# Patient Record
Sex: Male | Born: 1950
Health system: Southern US, Community
[De-identification: ages and names within clinical notes are randomized; demographics above are authoritative.]

## PROBLEM LIST (undated history)

## (undated) DIAGNOSIS — E039 Hypothyroidism, unspecified: Secondary | ICD-10-CM

## (undated) DIAGNOSIS — K219 Gastro-esophageal reflux disease without esophagitis: Secondary | ICD-10-CM

## (undated) DIAGNOSIS — I10 Essential (primary) hypertension: Secondary | ICD-10-CM

## (undated) DIAGNOSIS — K37 Unspecified appendicitis: Secondary | ICD-10-CM

## (undated) DIAGNOSIS — E785 Hyperlipidemia, unspecified: Secondary | ICD-10-CM

## (undated) DIAGNOSIS — I4891 Unspecified atrial fibrillation: Secondary | ICD-10-CM

## (undated) DIAGNOSIS — F419 Anxiety disorder, unspecified: Secondary | ICD-10-CM

## (undated) DIAGNOSIS — I509 Heart failure, unspecified: Secondary | ICD-10-CM

## (undated) HISTORY — DX: Gastro-esophageal reflux disease without esophagitis: K21.9

## (undated) HISTORY — DX: Essential (primary) hypertension: I10

## (undated) HISTORY — DX: Hypothyroidism, unspecified: E03.9

## (undated) HISTORY — DX: Hyperlipidemia, unspecified: E78.5

## (undated) HISTORY — DX: Anxiety disorder, unspecified: F41.9

## (undated) HISTORY — DX: Unspecified appendicitis: K37

---

## 1969-06-27 HISTORY — PX: APPENDECTOMY: SHX54

## 1970-06-27 HISTORY — PX: WISDOM TOOTH EXTRACTION: SHX21

## 2014-02-28 LAB — HM COLONOSCOPY

## 2014-03-25 ENCOUNTER — Encounter: Payer: Self-pay | Admitting: Physician Assistant

## 2014-03-25 ENCOUNTER — Ambulatory Visit (INDEPENDENT_AMBULATORY_CARE_PROVIDER_SITE_OTHER): Payer: 59 | Admitting: Physician Assistant

## 2014-03-25 VITALS — BP 141/95 | HR 72 | Temp 98.2°F | Resp 16 | Ht 71.0 in | Wt 211.0 lb

## 2014-03-25 DIAGNOSIS — Z23 Encounter for immunization: Secondary | ICD-10-CM

## 2014-03-25 DIAGNOSIS — F341 Dysthymic disorder: Secondary | ICD-10-CM

## 2014-03-25 DIAGNOSIS — F419 Anxiety disorder, unspecified: Secondary | ICD-10-CM

## 2014-03-25 DIAGNOSIS — F329 Major depressive disorder, single episode, unspecified: Secondary | ICD-10-CM | POA: Insufficient documentation

## 2014-03-25 DIAGNOSIS — F32A Depression, unspecified: Secondary | ICD-10-CM

## 2014-03-25 DIAGNOSIS — I10 Essential (primary) hypertension: Secondary | ICD-10-CM

## 2014-03-25 DIAGNOSIS — E785 Hyperlipidemia, unspecified: Secondary | ICD-10-CM | POA: Insufficient documentation

## 2014-03-25 DIAGNOSIS — E039 Hypothyroidism, unspecified: Secondary | ICD-10-CM

## 2014-03-25 DIAGNOSIS — K219 Gastro-esophageal reflux disease without esophagitis: Secondary | ICD-10-CM

## 2014-03-25 HISTORY — DX: Anxiety disorder, unspecified: F41.9

## 2014-03-25 HISTORY — DX: Depression, unspecified: F32.A

## 2014-03-25 HISTORY — DX: Essential (primary) hypertension: I10

## 2014-03-25 NOTE — Patient Instructions (Signed)
Please continue medications as directed with the following exception -- I would hold off on the Testosterone gel for now until we recheck your testosterone level.   Please schedule a physical with me within the next month.  Come fasting to that appointment.  It was a pleasure participating in your care today.  Welcome to [facility]!  Preventive Care for Adults A healthy lifestyle and preventive care can promote health and wellness. Preventive health guidelines for men include the following key practices:  A routine yearly physical is a good way to check with your health care provider about your health and preventative screening. It is a chance to share any concerns and updates on your health and to receive a thorough exam.  Visit your dentist for a routine exam and preventative care every 6 months. Brush your teeth twice a day and floss once a day. Good oral hygiene prevents tooth decay and gum disease.  The frequency of eye exams is based on your age, health, family medical history, use of contact lenses, and other factors. Follow your health care provider's recommendations for frequency of eye exams.  Eat a healthy diet. Foods such as vegetables, fruits, whole grains, low-fat dairy products, and lean protein foods contain the nutrients you need without too many calories. Decrease your intake of foods high in solid fats, added sugars, and salt. Eat the right amount of calories for you.Get information about a proper diet from your health care provider, if necessary.  Regular physical exercise is one of the most important things you can do for your health. Most adults should get at least 150 minutes of moderate-intensity exercise (any activity that increases your heart rate and causes you to sweat) each week. In addition, most adults need muscle-strengthening exercises on 2 or more days a week.  Maintain a healthy weight. The body mass index (BMI) is a screening tool to identify possible weight problems.  It provides an estimate of body fat based on height and weight. Your health care provider can find your BMI and can help you achieve or maintain a healthy weight.For adults 20 years and older:  A BMI below 18.5 is considered underweight.  A BMI of 18.5 to 24.9 is normal.  A BMI of 25 to 29.9 is considered overweight.  A BMI of 30 and above is considered obese.  Maintain normal blood lipids and cholesterol levels by exercising and minimizing your intake of saturated fat. Eat a balanced diet with plenty of fruit and vegetables. Blood tests for lipids and cholesterol should begin at age 61 and be repeated every 5 years. If your lipid or cholesterol levels are high, you are over 50, or you are at high risk for heart disease, you may need your cholesterol levels checked more frequently.Ongoing high lipid and cholesterol levels should be treated with medicines if diet and exercise are not working.  If you smoke, find out from your health care provider how to quit. If you do not use tobacco, do not start.  Lung cancer screening is recommended for adults aged 57-80 years who are at high risk for developing lung cancer because of a history of smoking. A yearly low-dose CT scan of the lungs is recommended for people who have at least a 30-pack-year history of smoking and are a current smoker or have quit within the past 15 years. A pack year of smoking is smoking an average of 1 pack of cigarettes a day for 1 year (for example: 1 pack a day  for 30 years or 2 packs a day for 15 years). Yearly screening should continue until the smoker has stopped smoking for at least 15 years. Yearly screening should be stopped for people who develop a health problem that would prevent them from having lung cancer treatment.  If you choose to drink alcohol, do not have more than 2 drinks per day. One drink is considered to be 12 ounces (355 mL) of beer, 5 ounces (148 mL) of wine, or 1.5 ounces (44 mL) of liquor.  Avoid use  of street drugs. Do not share needles with anyone. Ask for help if you need support or instructions about stopping the use of drugs.  High blood pressure causes heart disease and increases the risk of stroke. Your blood pressure should be checked at least every 1-2 years. Ongoing high blood pressure should be treated with medicines, if weight loss and exercise are not effective.  If you are 83-51 years old, ask your health care provider if you should take aspirin to prevent heart disease.  Diabetes screening involves taking a blood sample to check your fasting blood sugar level. This should be done once every 3 years, after age 39, if you are within normal weight and without risk factors for diabetes. Testing should be considered at a younger age or be carried out more frequently if you are overweight and have at least 1 risk factor for diabetes.  Colorectal cancer can be detected and often prevented. Most routine colorectal cancer screening begins at the age of 45 and continues through age 3. However, your health care provider may recommend screening at an earlier age if you have risk factors for colon cancer. On a yearly basis, your health care provider may provide home test kits to check for hidden blood in the stool. Use of a small camera at the end of a tube to directly examine the colon (sigmoidoscopy or colonoscopy) can detect the earliest forms of colorectal cancer. Talk to your health care provider about this at age 74, when routine screening begins. Direct exam of the colon should be repeated every 5-10 years through age 9, unless early forms of precancerous polyps or small growths are found.  People who are at an increased risk for hepatitis B should be screened for this virus. You are considered at high risk for hepatitis B if:  You were born in a country where hepatitis B occurs often. Talk with your health care provider about which countries are considered high risk.  Your parents were  born in a high-risk country and you have not received a shot to protect against hepatitis B (hepatitis B vaccine).  You have HIV or AIDS.  You use needles to inject street drugs.  You live with, or have sex with, someone who has hepatitis B.  You are a man who has sex with other men (MSM).  You get hemodialysis treatment.  You take certain medicines for conditions such as cancer, organ transplantation, and autoimmune conditions.  Hepatitis C blood testing is recommended for all people born from [birth year] through 1965 and any individual with known risks for hepatitis C.  Practice safe sex. Use condoms and avoid high-risk sexual practices to reduce the spread of sexually transmitted infections (STIs). STIs include gonorrhea, chlamydia, syphilis, trichomonas, herpes, HPV, and human immunodeficiency virus (HIV). Herpes, HIV, and HPV are viral illnesses that have no cure. They can result in disability, cancer, and death.  If you are at risk of being infected with HIV, it  is recommended that you take a prescription medicine daily to prevent HIV infection. This is called preexposure prophylaxis (PrEP). You are considered at risk if:  You are a man who has sex with other men (MSM) and have other risk factors.  You are a heterosexual man, are sexually active, and are at increased risk for HIV infection.  You take drugs by injection.  You are sexually active with a partner who has HIV.  Talk with your health care provider about whether you are at high risk of being infected with HIV. If you choose to begin PrEP, you should first be tested for HIV. You should then be tested every 3 months for as long as you are taking PrEP.  A one-time screening for abdominal aortic aneurysm (AAA) and surgical repair of large AAAs by ultrasound are recommended for men ages 69 to 54 years who are current or former smokers.  Healthy men should no longer receive prostate-specific antigen (PSA) blood tests as part of  routine cancer screening. Talk with your health care provider about prostate cancer screening.  Testicular cancer screening is not recommended for adult males who have no symptoms. Screening includes self-exam, a health care provider exam, and other screening tests. Consult with your health care provider about any symptoms you have or any concerns you have about testicular cancer.  Use sunscreen. Apply sunscreen liberally and repeatedly throughout the day. You should seek shade when your shadow is shorter than you. Protect yourself by wearing long sleeves, pants, a wide-brimmed hat, and sunglasses year round, whenever you are outdoors.  Once a month, do a whole-body skin exam, using a mirror to look at the skin on your back. Tell your health care provider about new moles, moles that have irregular borders, moles that are larger than a pencil eraser, or moles that have changed in shape or color.  Stay current with required vaccines (immunizations).  Influenza vaccine. All adults should be immunized every year.  Tetanus, diphtheria, and acellular pertussis (Td, Tdap) vaccine. An adult who has not previously received Tdap or who does not know his vaccine status should receive 1 dose of Tdap. This initial dose should be followed by tetanus and diphtheria toxoids (Td) booster doses every 10 years. Adults with an unknown or incomplete history of completing a 3-dose immunization series with Td-containing vaccines should begin or complete a primary immunization series including a Tdap dose. Adults should receive a Td booster every 10 years.  Varicella vaccine. An adult without evidence of immunity to varicella should receive 2 doses or a second dose if he has previously received 1 dose.  Human papillomavirus (HPV) vaccine. Males aged 49-21 years who have not received the vaccine previously should receive the 3-dose series. Males aged 22-26 years may be immunized. Immunization is recommended through the age  of 31 years for any male who has sex with males and did not get any or all doses earlier. Immunization is recommended for any person with an immunocompromised condition through the age of 61 years if he did not get any or all doses earlier. During the 3-dose series, the second dose should be obtained 4-8 weeks after the first dose. The third dose should be obtained 24 weeks after the first dose and 16 weeks after the second dose.  Zoster vaccine. One dose is recommended for adults aged 63 years or older unless certain conditions are present.  Measles, mumps, and rubella (MMR) vaccine. Adults born before [birth year] generally are considered immune to measles  and mumps. Adults born in [birth year] or later should have 1 or more doses of MMR vaccine unless there is a contraindication to the vaccine or there is laboratory evidence of immunity to each of the three diseases. A routine second dose of MMR vaccine should be obtained at least 28 days after the first dose for students attending postsecondary schools, health care workers, or international travelers. People who received inactivated measles vaccine or an unknown type of measles vaccine during 1963-1967 should receive 2 doses of MMR vaccine. People who received inactivated mumps vaccine or an unknown type of mumps vaccine before 1979 and are at high risk for mumps infection should consider immunization with 2 doses of MMR vaccine. Unvaccinated health care workers born before [birth year] who lack laboratory evidence of measles, mumps, or rubella immunity or laboratory confirmation of disease should consider measles and mumps immunization with 2 doses of MMR vaccine or rubella immunization with 1 dose of MMR vaccine.  Pneumococcal 13-valent conjugate (PCV13) vaccine. When indicated, a person who is uncertain of his immunization history and has no record of immunization should receive the PCV13 vaccine. An adult aged 55 years or older who has certain medical conditions and has not  been previously immunized should receive 1 dose of PCV13 vaccine. This PCV13 should be followed with a dose of pneumococcal polysaccharide (PPSV23) vaccine. The PPSV23 vaccine dose should be obtained at least 8 weeks after the dose of PCV13 vaccine. An adult aged 13 years or older who has certain medical conditions and previously received 1 or more doses of PPSV23 vaccine should receive 1 dose of PCV13. The PCV13 vaccine dose should be obtained 1 or more years after the last PPSV23 vaccine dose.  Pneumococcal polysaccharide (PPSV23) vaccine. When PCV13 is also indicated, PCV13 should be obtained first. All adults aged 32 years and older should be immunized. An adult younger than age 29 years who has certain medical conditions should be immunized. Any person who resides in a nursing home or long-term care facility should be immunized. An adult smoker should be immunized. People with an immunocompromised condition and certain other conditions should receive both PCV13 and PPSV23 vaccines. People with human immunodeficiency virus (HIV) infection should be immunized as soon as possible after diagnosis. Immunization during chemotherapy or radiation therapy should be avoided. Routine use of PPSV23 vaccine is not recommended for American Indians, [place]atives, or people younger than 65 years unless there are medical conditions that require PPSV23 vaccine. When indicated, people who have unknown immunization and have no record of immunization should receive PPSV23 vaccine. One-time revaccination 5 years after the first dose of PPSV23 is recommended for people aged 19-64 years who have chronic kidney failure, nephrotic syndrome, asplenia, or immunocompromised conditions. People who received 1-2 doses of PPSV23 before age 28 years should receive another dose of PPSV23 vaccine at age 42 years or later if at least 5 years have passed since the previous dose. Doses of PPSV23 are not needed for people immunized with PPSV23 at  or after age 48 years.  Meningococcal vaccine. Adults with asplenia or persistent complement component deficiencies should receive 2 doses of quadrivalent meningococcal conjugate (MenACWY-D) vaccine. The doses should be obtained at least 2 months apart. Microbiologists working with certain meningococcal bacteria, [employer] recruits, people at risk during an outbreak, and people who travel to or live in countries with a high rate of meningitis should be immunized. A first-year college student up through age 46 years who is living in a residence hall  should receive a dose if he did not receive a dose on or after his 16th birthday. Adults who have certain high-risk conditions should receive one or more doses of vaccine.  Hepatitis A vaccine. Adults who wish to be protected from this disease, have certain high-risk conditions, work with hepatitis A-infected animals, work in hepatitis A research labs, or travel to or work in countries with a high rate of hepatitis A should be immunized. Adults who were previously unvaccinated and who anticipate close contact with an international adoptee during the first 60 days after arrival in the [place] from a country with a high rate of hepatitis A should be immunized.  Hepatitis B vaccine. Adults should be immunized if they wish to be protected from this disease, have certain high-risk conditions, may be exposed to blood or other infectious body fluids, are household contacts or sex partners of hepatitis B positive people, are clients or workers in certain care facilities, or travel to or work in countries with a high rate of hepatitis B.  Haemophilus influenzae type b (Hib) vaccine. A previously unvaccinated person with asplenia or sickle cell disease or having a scheduled splenectomy should receive 1 dose of Hib vaccine. Regardless of previous immunization, a recipient of a hematopoietic stem cell transplant should receive a 3-dose series 6-12 months after his  successful transplant. Hib vaccine is not recommended for adults with HIV infection. Preventive Service / Frequency Ages 52 to 17  Blood pressure check.** / Every 1 to 2 years.  Lipid and cholesterol check.** / Every 5 years beginning at age 57.  Hepatitis C blood test.** / For any individual with known risks for hepatitis C.  Skin self-exam. / Monthly.  Influenza vaccine. / Every year.  Tetanus, diphtheria, and acellular pertussis (Tdap, Td) vaccine.** / Consult your health care provider. 1 dose of Td every 10 years.  Varicella vaccine.** / Consult your health care provider.  HPV vaccine. / 3 doses over 6 months, if 45 or younger.  Measles, mumps, rubella (MMR) vaccine.** / You need at least 1 dose of MMR if you were born in 1957 or later. You may also need a second dose.  Pneumococcal 13-valent conjugate (PCV13) vaccine.** / Consult your health care provider.  Pneumococcal polysaccharide (PPSV23) vaccine.** / 1 to 2 doses if you smoke cigarettes or if you have certain conditions.  Meningococcal vaccine.** / 1 dose if you are age 11 to 66 years and a Market researcher living in a residence hall, or have one of several medical conditions. You may also need additional booster doses.  Hepatitis A vaccine.** / Consult your health care provider.  Hepatitis B vaccine.** / Consult your health care provider.  Haemophilus influenzae type b (Hib) vaccine.** / Consult your health care provider. Ages 34 to 15  Blood pressure check.** / Every 1 to 2 years.  Lipid and cholesterol check.** / Every 5 years beginning at age 65.  Lung cancer screening. / Every year if you are aged 44-80 years and have a 30-pack-year history of smoking and currently smoke or have quit within the past 15 years. Yearly screening is stopped once you have quit smoking for at least 15 years or develop a health problem that would prevent you from having lung cancer treatment.  Fecal occult blood test (FOBT)  of stool. / Every year beginning at age 8 and continuing until age 29. You may not have to do this test if you get a colonoscopy every 10 years.  Flexible  sigmoidoscopy** or colonoscopy.** / Every 5 years for a flexible sigmoidoscopy or every 10 years for a colonoscopy beginning at age 13 and continuing until age 41.  Hepatitis C blood test.** / For all people born from [birth year] through 1965 and any individual with known risks for hepatitis C.  Skin self-exam. / Monthly.  Influenza vaccine. / Every year.  Tetanus, diphtheria, and acellular pertussis (Tdap/Td) vaccine.** / Consult your health care provider. 1 dose of Td every 10 years.  Varicella vaccine.** / Consult your health care provider.  Zoster vaccine.** / 1 dose for adults aged 1 years or older.  Measles, mumps, rubella (MMR) vaccine.** / You need at least 1 dose of MMR if you were born in 1957 or later. You may also need a second dose.  Pneumococcal 13-valent conjugate (PCV13) vaccine.** / Consult your health care provider.  Pneumococcal polysaccharide (PPSV23) vaccine.** / 1 to 2 doses if you smoke cigarettes or if you have certain conditions.  Meningococcal vaccine.** / Consult your health care provider.  Hepatitis A vaccine.** / Consult your health care provider.  Hepatitis B vaccine.** / Consult your health care provider.  Haemophilus influenzae type b (Hib) vaccine.** / Consult your health care provider. Ages 75 and over  Blood pressure check.** / Every 1 to 2 years.  Lipid and cholesterol check.**/ Every 5 years beginning at age 35.  Lung cancer screening. / Every year if you are aged 43-80 years and have a 30-pack-year history of smoking and currently smoke or have quit within the past 15 years. Yearly screening is stopped once you have quit smoking for at least 15 years or develop a health problem that would prevent you from having lung cancer treatment.  Fecal occult blood test (FOBT) of stool. / Every year  beginning at age 43 and continuing until age 19. You may not have to do this test if you get a colonoscopy every 10 years.  Flexible sigmoidoscopy** or colonoscopy.** / Every 5 years for a flexible sigmoidoscopy or every 10 years for a colonoscopy beginning at age 59 and continuing until age 24.  Hepatitis C blood test.** / For all people born from [birth year] through 1967 and any individual with known risks for hepatitis C.  Abdominal aortic aneurysm (AAA) screening.** / A one-time screening for ages 51 to 17 years who are current or former smokers.  Skin self-exam. / Monthly.  Influenza vaccine. / Every year.  Tetanus, diphtheria, and acellular pertussis (Tdap/Td) vaccine.** / 1 dose of Td every 10 years.  Varicella vaccine.** / Consult your health care provider.  Zoster vaccine.** / 1 dose for adults aged 83 years or older.  Pneumococcal 13-valent conjugate (PCV13) vaccine.** / Consult your health care provider.  Pneumococcal polysaccharide (PPSV23) vaccine.** / 1 dose for all adults aged 43 years and older.  Meningococcal vaccine.** / Consult your health care provider.  Hepatitis A vaccine.** / Consult your health care provider.  Hepatitis B vaccine.** / Consult your health care provider.  Haemophilus influenzae type b (Hib) vaccine.** / Consult your health care provider. **Family history and personal history of risk and conditions may change your health care provider's recommendations. Document Released: 08/09/2001 Document Revised: 06/18/2013 Document Reviewed: 11/08/2010 [facility] Patient Information 2015 [place], [place]. This information is not intended to replace advice given to you by your health care provider. Make sure you discuss any questions you have with your health care provider.

## 2014-03-25 NOTE — Progress Notes (Signed)
Patient presents to clinic today to establish care.  Acute Concerns: Patient denies acute concerns at today's visit.  Chronic Issues: Hypertension -- Endorses well controlled with Hyzaar 50-12.5 mg daily.  Denies chest pain, palpitations. lightheadedness, vision changes or frequent headaches  Hyperlipidemia -- Endorses taking Lipitor 80 mg daily.  Denies myalgias.  Denies hx of elevated liver enzymes.  Hypothyroidism -- Currently on Levothyroxine 112 mcg daily.  Endorses well-controlled for several years on this dose.  GERD -- Good relief with Omeprazole 40 mg daily.   Anxiety -- Well-controlled with Paxil.  Denies panic attack, depressed mood or SI.  Health Maintenance: Colonoscopy -- 2015; no abnormal findings; due in 2025. Immunizations -- Tetanus 2012; Will be getting influenza vaccination at work; Zostavax in 2010.  Past Medical History  Diagnosis Date  . Hyperlipidemia   . Hypothyroidism   . Hypertension   . GERD (gastroesophageal reflux disease)   . Anxiety   . Appendicitis     Past Surgical History  Procedure Laterality Date  . Appendectomy  1971  . Wisdom tooth extraction  1972    No current outpatient prescriptions on file prior to visit.   No current facility-administered medications on file prior to visit.    Allergies  Allergen Reactions  . Codeine Nausea Only  . Niacin And Related     Family History  Problem Relation Age of Onset  . Stroke Mother 35    Deceased  . Heart attack Mother   . Lymphoma Father 72    Deceased  . Heart disease Father   . Stroke Maternal Grandmother   . Stomach cancer Paternal Grandfather   . Healthy Brother     x2  . Healthy Sister   . Hypertension Daughter   . Healthy Daughter     History   Social History  . Marital Status: Married    Spouse Name: N/A    Number of Children: N/A  . Years of Education: N/A   Occupational History  . Not on file.   Social History Main Topics  . Smoking status: Former  Smoker    Types: Cigarettes    Quit date: 06/28/1979  . Smokeless tobacco: Never Used  . Alcohol Use: Not on file  . Drug Use: Not on file  . Sexual Activity: Not on file   Other Topics Concern  . Not on file   Social History Narrative  . No narrative on file   ROS See HPI.  All other ROS are negative  BP 141/95  Pulse 72  Temp(Src) 98.2 F (36.8 C) (Oral)  Resp 16  Ht 5\' 11"  (1.803 m)  Wt 211 lb (95.709 kg)  BMI 29.44 kg/m2  SpO2 98%  Physical Exam  Vitals reviewed. Constitutional: He is oriented to person, place, and time and well-developed, well-nourished, and in no distress.  HENT:  Head: Normocephalic and atraumatic.  Right Ear: External ear normal.  Left Ear: External ear normal.  Nose: Nose normal.  Mouth/Throat: Oropharynx is clear and moist. No oropharyngeal exudate.  TM within normal limits bilaterally.  Eyes: Conjunctivae are normal. Pupils are equal, round, and reactive to light.  Neck: Neck supple.  Cardiovascular: Normal rate, regular rhythm, normal heart sounds and intact distal pulses.   Pulmonary/Chest: Effort normal and breath sounds normal. No respiratory distress. He has no wheezes. He has no rales. He exhibits no tenderness.  Neurological: He is alert and oriented to person, place, and time.  Skin: Skin is warm and dry. No rash  noted.  Psychiatric: Affect normal.    Assessment/Plan: Essential hypertension, benign Well-controlled.  Continue current regimen.  Patient to return this month for a CPE with fasting labs.  GERD (gastroesophageal reflux disease) Well controlled.  Continue current regimen.  Encouraged patient to consider trial of weaning to 20 mg daily.   Unspecified hypothyroidism Continue current regimen.  Will check TSH at CPE next month.  Anxiety and depression Well-controlled.  Continue Paxil as directed.  Hyperlipidemia Continue current regimen.  Will obtain LFTs and fasting lipid panel at CPE.  Need for prophylactic  vaccination against Streptococcus pneumoniae (pneumococcus) Prevnar given by nursing staff.

## 2014-03-25 NOTE — Assessment & Plan Note (Signed)
Well controlled.  Continue current regimen.  Encouraged patient to consider trial of weaning to 20 mg daily.

## 2014-03-25 NOTE — Progress Notes (Signed)
Pre visit review using our clinic review tool, if applicable. No additional management support is needed unless otherwise documented below in the visit note/SLS  

## 2014-03-25 NOTE — Assessment & Plan Note (Signed)
Well-controlled.  Continue Paxil as directed.

## 2014-03-25 NOTE — Assessment & Plan Note (Signed)
Continue current regimen.  Will obtain LFTs and fasting lipid panel at CPE.

## 2014-03-25 NOTE — Assessment & Plan Note (Signed)
Prevnar given by nursing staff. 

## 2014-03-25 NOTE — Assessment & Plan Note (Signed)
Well-controlled.  Continue current regimen.  Patient to return this month for a CPE with fasting labs.

## 2014-03-25 NOTE — Assessment & Plan Note (Signed)
Continue current regimen.  Will check TSH at CPE next month.

## 2014-04-09 ENCOUNTER — Telehealth: Payer: Self-pay | Admitting: *Deleted

## 2014-04-09 NOTE — Telephone Encounter (Signed)
Medical records received from [facility]. Records forwarded to Cody's CMA, Ivin Booty, for review/abstraction. JG//CMA

## 2014-04-30 ENCOUNTER — Ambulatory Visit (INDEPENDENT_AMBULATORY_CARE_PROVIDER_SITE_OTHER): Payer: 59 | Admitting: Physician Assistant

## 2014-04-30 ENCOUNTER — Encounter: Payer: Self-pay | Admitting: Physician Assistant

## 2014-04-30 VITALS — BP 149/87 | HR 65 | Temp 98.2°F | Ht 71.0 in | Wt 209.0 lb

## 2014-04-30 DIAGNOSIS — I10 Essential (primary) hypertension: Secondary | ICD-10-CM

## 2014-04-30 DIAGNOSIS — Z125 Encounter for screening for malignant neoplasm of prostate: Secondary | ICD-10-CM

## 2014-04-30 DIAGNOSIS — Z0000 Encounter for general adult medical examination without abnormal findings: Secondary | ICD-10-CM | POA: Insufficient documentation

## 2014-04-30 DIAGNOSIS — Z136 Encounter for screening for cardiovascular disorders: Secondary | ICD-10-CM

## 2014-04-30 HISTORY — DX: Encounter for screening for malignant neoplasm of prostate: Z12.5

## 2014-04-30 HISTORY — DX: Encounter for general adult medical examination without abnormal findings: Z00.00

## 2014-04-30 HISTORY — DX: Encounter for screening for cardiovascular disorders: Z13.6

## 2014-04-30 LAB — CBC
HEMATOCRIT: 40.4 % (ref 39.0–52.0)
Hemoglobin: 13.8 g/dL (ref 13.0–17.0)
MCHC: 34.1 g/dL (ref 30.0–36.0)
MCV: 90.6 fl (ref 78.0–100.0)
Platelets: 139 10*3/uL — ABNORMAL LOW (ref 150.0–400.0)
RBC: 4.46 Mil/uL (ref 4.22–5.81)
RDW: 13 % (ref 11.5–15.5)
WBC: 5.8 10*3/uL (ref 4.0–10.5)

## 2014-04-30 LAB — URINALYSIS, ROUTINE W REFLEX MICROSCOPIC
Bilirubin Urine: NEGATIVE
Ketones, ur: NEGATIVE
Leukocytes, UA: NEGATIVE
Nitrite: NEGATIVE
RBC / HPF: NONE SEEN (ref 0–?)
Specific Gravity, Urine: 1.01 (ref 1.000–1.030)
Total Protein, Urine: NEGATIVE
UROBILINOGEN UA: 0.2 (ref 0.0–1.0)
Urine Glucose: NEGATIVE
WBC UA: NONE SEEN (ref 0–?)
pH: 7 (ref 5.0–8.0)

## 2014-04-30 LAB — HEPATIC FUNCTION PANEL
ALBUMIN: 3.6 g/dL (ref 3.5–5.2)
ALT: 26 U/L (ref 0–53)
AST: 24 U/L (ref 0–37)
Alkaline Phosphatase: 67 U/L (ref 39–117)
Bilirubin, Direct: 0.2 mg/dL (ref 0.0–0.3)
Total Bilirubin: 1.3 mg/dL — ABNORMAL HIGH (ref 0.2–1.2)
Total Protein: 7.1 g/dL (ref 6.0–8.3)

## 2014-04-30 LAB — TSH: TSH: 2.39 u[IU]/mL (ref 0.35–4.50)

## 2014-04-30 LAB — PSA: PSA: 0.36 ng/mL (ref 0.10–4.00)

## 2014-04-30 LAB — HEMOGLOBIN A1C: Hgb A1c MFr Bld: 5.6 % (ref 4.6–6.5)

## 2014-04-30 NOTE — Patient Instructions (Signed)
Please stop by the lab for blood work.  I will call you with your results. I do believe your elevated BP is related to [facility] Coat Syndrome as your BP home measurements are within normal limits with current regimen.  Read information below on the DASH diet.    Follow-up will be based on your lab results.  Otherwise follow-up in 6 months.  Preventive Care for Adults A healthy lifestyle and preventive care can promote health and wellness. Preventive health guidelines for men include the following key practices:  A routine yearly physical is a good way to check with your health care provider about your health and preventative screening. It is a chance to share any concerns and updates on your health and to receive a thorough exam.  Visit your dentist for a routine exam and preventative care every 6 months. Brush your teeth twice a day and floss once a day. Good oral hygiene prevents tooth decay and gum disease.  The frequency of eye exams is based on your age, health, family medical history, use of contact lenses, and other factors. Follow your health care provider's recommendations for frequency of eye exams.  Eat a healthy diet. Foods such as vegetables, fruits, whole grains, low-fat dairy products, and lean protein foods contain the nutrients you need without too many calories. Decrease your intake of foods high in solid fats, added sugars, and salt. Eat the right amount of calories for you.Get information about a proper diet from your health care provider, if necessary.  Regular physical exercise is one of the most important things you can do for your health. Most adults should get at least 150 minutes of moderate-intensity exercise (any activity that increases your heart rate and causes you to sweat) each week. In addition, most adults need muscle-strengthening exercises on 2 or more days a week.  Maintain a healthy weight. The body mass index (BMI) is a screening tool to identify possible weight  problems. It provides an estimate of body fat based on height and weight. Your health care provider can find your BMI and can help you achieve or maintain a healthy weight.For adults 20 years and older:  A BMI below 18.5 is considered underweight.  A BMI of 18.5 to 24.9 is normal.  A BMI of 25 to 29.9 is considered overweight.  A BMI of 30 and above is considered obese.  Maintain normal blood lipids and cholesterol levels by exercising and minimizing your intake of saturated fat. Eat a balanced diet with plenty of fruit and vegetables. Blood tests for lipids and cholesterol should begin at age 25 and be repeated every 5 years. If your lipid or cholesterol levels are high, you are over 50, or you are at high risk for heart disease, you may need your cholesterol levels checked more frequently.Ongoing high lipid and cholesterol levels should be treated with medicines if diet and exercise are not working.  If you smoke, find out from your health care provider how to quit. If you do not use tobacco, do not start.  Lung cancer screening is recommended for adults aged 89-80 years who are at high risk for developing lung cancer because of a history of smoking. A yearly low-dose CT scan of the lungs is recommended for people who have at least a 30-pack-year history of smoking and are a current smoker or have quit within the past 15 years. A pack year of smoking is smoking an average of 1 pack of cigarettes a day for 1  year (for example: 1 pack a day for 30 years or 2 packs a day for 15 years). Yearly screening should continue until the smoker has stopped smoking for at least 15 years. Yearly screening should be stopped for people who develop a health problem that would prevent them from having lung cancer treatment.  If you choose to drink alcohol, do not have more than 2 drinks per day. One drink is considered to be 12 ounces (355 mL) of beer, 5 ounces (148 mL) of wine, or 1.5 ounces (44 mL) of  liquor.  Avoid use of street drugs. Do not share needles with anyone. Ask for help if you need support or instructions about stopping the use of drugs.  High blood pressure causes heart disease and increases the risk of stroke. Your blood pressure should be checked at least every 1-2 years. Ongoing high blood pressure should be treated with medicines, if weight loss and exercise are not effective.  If you are 2-71 years old, ask your health care provider if you should take aspirin to prevent heart disease.  Diabetes screening involves taking a blood sample to check your fasting blood sugar level. This should be done once every 3 years, after age 36, if you are within normal weight and without risk factors for diabetes. Testing should be considered at a younger age or be carried out more frequently if you are overweight and have at least 1 risk factor for diabetes.  Colorectal cancer can be detected and often prevented. Most routine colorectal cancer screening begins at the age of 63 and continues through age 62. However, your health care provider may recommend screening at an earlier age if you have risk factors for colon cancer. On a yearly basis, your health care provider may provide home test kits to check for hidden blood in the stool. Use of a small camera at the end of a tube to directly examine the colon (sigmoidoscopy or colonoscopy) can detect the earliest forms of colorectal cancer. Talk to your health care provider about this at age 49, when routine screening begins. Direct exam of the colon should be repeated every 5-10 years through age 10, unless early forms of precancerous polyps or small growths are found.  People who are at an increased risk for hepatitis B should be screened for this virus. You are considered at high risk for hepatitis B if:  You were born in a country where hepatitis B occurs often. Talk with your health care provider about which countries are considered high  risk.  Your parents were born in a high-risk country and you have not received a shot to protect against hepatitis B (hepatitis B vaccine).  You have HIV or AIDS.  You use needles to inject street drugs.  You live with, or have sex with, someone who has hepatitis B.  You are a man who has sex with other men (MSM).  You get hemodialysis treatment.  You take certain medicines for conditions such as cancer, organ transplantation, and autoimmune conditions.  Hepatitis C blood testing is recommended for all people born from [birth year] through 1965 and any individual with known risks for hepatitis C.  Practice safe sex. Use condoms and avoid high-risk sexual practices to reduce the spread of sexually transmitted infections (STIs). STIs include gonorrhea, chlamydia, syphilis, trichomonas, herpes, HPV, and human immunodeficiency virus (HIV). Herpes, HIV, and HPV are viral illnesses that have no cure. They can result in disability, cancer, and death.  If you are at  risk of being infected with HIV, it is recommended that you take a prescription medicine daily to prevent HIV infection. This is called preexposure prophylaxis (PrEP). You are considered at risk if:  You are a man who has sex with other men (MSM) and have other risk factors.  You are a heterosexual man, are sexually active, and are at increased risk for HIV infection.  You take drugs by injection.  You are sexually active with a partner who has HIV.  Talk with your health care provider about whether you are at high risk of being infected with HIV. If you choose to begin PrEP, you should first be tested for HIV. You should then be tested every 3 months for as long as you are taking PrEP.  A one-time screening for abdominal aortic aneurysm (AAA) and surgical repair of large AAAs by ultrasound are recommended for men ages 50 to 10 years who are current or former smokers.  Healthy men should no longer receive prostate-specific antigen (PSA)  blood tests as part of routine cancer screening. Talk with your health care provider about prostate cancer screening.  Testicular cancer screening is not recommended for adult males who have no symptoms. Screening includes self-exam, a health care provider exam, and other screening tests. Consult with your health care provider about any symptoms you have or any concerns you have about testicular cancer.  Use sunscreen. Apply sunscreen liberally and repeatedly throughout the day. You should seek shade when your shadow is shorter than you. Protect yourself by wearing long sleeves, pants, a wide-brimmed hat, and sunglasses year round, whenever you are outdoors.  Once a month, do a whole-body skin exam, using a mirror to look at the skin on your back. Tell your health care provider about new moles, moles that have irregular borders, moles that are larger than a pencil eraser, or moles that have changed in shape or color.  Stay current with required vaccines (immunizations).  Influenza vaccine. All adults should be immunized every year.  Tetanus, diphtheria, and acellular pertussis (Td, Tdap) vaccine. An adult who has not previously received Tdap or who does not know his vaccine status should receive 1 dose of Tdap. This initial dose should be followed by tetanus and diphtheria toxoids (Td) booster doses every 10 years. Adults with an unknown or incomplete history of completing a 3-dose immunization series with Td-containing vaccines should begin or complete a primary immunization series including a Tdap dose. Adults should receive a Td booster every 10 years.  Varicella vaccine. An adult without evidence of immunity to varicella should receive 2 doses or a second dose if he has previously received 1 dose.  Human papillomavirus (HPV) vaccine. Males aged 13-21 years who have not received the vaccine previously should receive the 3-dose series. Males aged 22-26 years may be immunized. Immunization is  recommended through the age of 64 years for any male who has sex with males and did not get any or all doses earlier. Immunization is recommended for any person with an immunocompromised condition through the age of 26 years if he did not get any or all doses earlier. During the 3-dose series, the second dose should be obtained 4-8 weeks after the first dose. The third dose should be obtained 24 weeks after the first dose and 16 weeks after the second dose.  Zoster vaccine. One dose is recommended for adults aged 70 years or older unless certain conditions are present.  Measles, mumps, and rubella (MMR) vaccine. Adults born before  1959 generally are considered immune to measles and mumps. Adults born in [birth year] or later should have 1 or more doses of MMR vaccine unless there is a contraindication to the vaccine or there is laboratory evidence of immunity to each of the three diseases. A routine second dose of MMR vaccine should be obtained at least 28 days after the first dose for students attending postsecondary schools, health care workers, or international travelers. People who received inactivated measles vaccine or an unknown type of measles vaccine during 1963-1967 should receive 2 doses of MMR vaccine. People who received inactivated mumps vaccine or an unknown type of mumps vaccine before 1979 and are at high risk for mumps infection should consider immunization with 2 doses of MMR vaccine. Unvaccinated health care workers born before [birth year] who lack laboratory evidence of measles, mumps, or rubella immunity or laboratory confirmation of disease should consider measles and mumps immunization with 2 doses of MMR vaccine or rubella immunization with 1 dose of MMR vaccine.  Pneumococcal 13-valent conjugate (PCV13) vaccine. When indicated, a person who is uncertain of his immunization history and has no record of immunization should receive the PCV13 vaccine. An adult aged 38 years or older who has certain  medical conditions and has not been previously immunized should receive 1 dose of PCV13 vaccine. This PCV13 should be followed with a dose of pneumococcal polysaccharide (PPSV23) vaccine. The PPSV23 vaccine dose should be obtained at least 8 weeks after the dose of PCV13 vaccine. An adult aged 31 years or older who has certain medical conditions and previously received 1 or more doses of PPSV23 vaccine should receive 1 dose of PCV13. The PCV13 vaccine dose should be obtained 1 or more years after the last PPSV23 vaccine dose.  Pneumococcal polysaccharide (PPSV23) vaccine. When PCV13 is also indicated, PCV13 should be obtained first. All adults aged 45 years and older should be immunized. An adult younger than age 68 years who has certain medical conditions should be immunized. Any person who resides in a nursing home or long-term care facility should be immunized. An adult smoker should be immunized. People with an immunocompromised condition and certain other conditions should receive both PCV13 and PPSV23 vaccines. People with human immunodeficiency virus (HIV) infection should be immunized as soon as possible after diagnosis. Immunization during chemotherapy or radiation therapy should be avoided. Routine use of PPSV23 vaccine is not recommended for American Indians, [place] Natives, or people younger than 65 years unless there are medical conditions that require PPSV23 vaccine. When indicated, people who have unknown immunization and have no record of immunization should receive PPSV23 vaccine. One-time revaccination 5 years after the first dose of PPSV23 is recommended for people aged 19-64 years who have chronic kidney failure, nephrotic syndrome, asplenia, or immunocompromised conditions. People who received 1-2 doses of PPSV23 before age 2 years should receive another dose of PPSV23 vaccine at age 60 years or later if at least 5 years have passed since the previous dose. Doses of PPSV23 are not needed for  people immunized with PPSV23 at or after age 23 years.  Meningococcal vaccine. Adults with asplenia or persistent complement component deficiencies should receive 2 doses of quadrivalent meningococcal conjugate (MenACWY-D) vaccine. The doses should be obtained at least 2 months apart. Microbiologists working with certain meningococcal bacteria, [employer] recruits, people at risk during an outbreak, and people who travel to or live in countries with a high rate of meningitis should be immunized. A first-year college student up through age 43 years  who is living in a residence hall should receive a dose if he did not receive a dose on or after his 16th birthday. Adults who have certain high-risk conditions should receive one or more doses of vaccine.  Hepatitis A vaccine. Adults who wish to be protected from this disease, have certain high-risk conditions, work with hepatitis A-infected animals, work in hepatitis A research labs, or travel to or work in countries with a high rate of hepatitis A should be immunized. Adults who were previously unvaccinated and who anticipate close contact with an international adoptee during the first 60 days after arrival in the [place] from a country with a high rate of hepatitis A should be immunized.  Hepatitis B vaccine. Adults should be immunized if they wish to be protected from this disease, have certain high-risk conditions, may be exposed to blood or other infectious body fluids, are household contacts or sex partners of hepatitis B positive people, are clients or workers in certain care facilities, or travel to or work in countries with a high rate of hepatitis B.  Haemophilus influenzae type b (Hib) vaccine. A previously unvaccinated person with asplenia or sickle cell disease or having a scheduled splenectomy should receive 1 dose of Hib vaccine. Regardless of previous immunization, a recipient of a hematopoietic stem cell transplant should receive a 3-dose  series 6-12 months after his successful transplant. Hib vaccine is not recommended for adults with HIV infection. Preventive Service / Frequency Ages 92 to 92  Blood pressure check.** / Every 1 to 2 years.  Lipid and cholesterol check.** / Every 5 years beginning at age 44.  Hepatitis C blood test.** / For any individual with known risks for hepatitis C.  Skin self-exam. / Monthly.  Influenza vaccine. / Every year.  Tetanus, diphtheria, and acellular pertussis (Tdap, Td) vaccine.** / Consult your health care provider. 1 dose of Td every 10 years.  Varicella vaccine.** / Consult your health care provider.  HPV vaccine. / 3 doses over 6 months, if 22 or younger.  Measles, mumps, rubella (MMR) vaccine.** / You need at least 1 dose of MMR if you were born in 1957 or later. You may also need a second dose.  Pneumococcal 13-valent conjugate (PCV13) vaccine.** / Consult your health care provider.  Pneumococcal polysaccharide (PPSV23) vaccine.** / 1 to 2 doses if you smoke cigarettes or if you have certain conditions.  Meningococcal vaccine.** / 1 dose if you are age 73 to 20 years and a Market researcher living in a residence hall, or have one of several medical conditions. You may also need additional booster doses.  Hepatitis A vaccine.** / Consult your health care provider.  Hepatitis B vaccine.** / Consult your health care provider.  Haemophilus influenzae type b (Hib) vaccine.** / Consult your health care provider. Ages 30 to 3  Blood pressure check.** / Every 1 to 2 years.  Lipid and cholesterol check.** / Every 5 years beginning at age 67.  Lung cancer screening. / Every year if you are aged 45-80 years and have a 30-pack-year history of smoking and currently smoke or have quit within the past 15 years. Yearly screening is stopped once you have quit smoking for at least 15 years or develop a health problem that would prevent you from having lung cancer  treatment.  Fecal occult blood test (FOBT) of stool. / Every year beginning at age 28 and continuing until age 59. You may not have to do this test if you get  a colonoscopy every 10 years.  Flexible sigmoidoscopy** or colonoscopy.** / Every 5 years for a flexible sigmoidoscopy or every 10 years for a colonoscopy beginning at age 46 and continuing until age 72.  Hepatitis C blood test.** / For all people born from [birth year] through 1967 and any individual with known risks for hepatitis C.  Skin self-exam. / Monthly.  Influenza vaccine. / Every year.  Tetanus, diphtheria, and acellular pertussis (Tdap/Td) vaccine.** / Consult your health care provider. 1 dose of Td every 10 years.  Varicella vaccine.** / Consult your health care provider.  Zoster vaccine.** / 1 dose for adults aged 81 years or older.  Measles, mumps, rubella (MMR) vaccine.** / You need at least 1 dose of MMR if you were born in 1957 or later. You may also need a second dose.  Pneumococcal 13-valent conjugate (PCV13) vaccine.** / Consult your health care provider.  Pneumococcal polysaccharide (PPSV23) vaccine.** / 1 to 2 doses if you smoke cigarettes or if you have certain conditions.  Meningococcal vaccine.** / Consult your health care provider.  Hepatitis A vaccine.** / Consult your health care provider.  Hepatitis B vaccine.** / Consult your health care provider.  Haemophilus influenzae type b (Hib) vaccine.** / Consult your health care provider. Ages 25 and over  Blood pressure check.** / Every 1 to 2 years.  Lipid and cholesterol check.**/ Every 5 years beginning at age 55.  Lung cancer screening. / Every year if you are aged 3-80 years and have a 30-pack-year history of smoking and currently smoke or have quit within the past 15 years. Yearly screening is stopped once you have quit smoking for at least 15 years or develop a health problem that would prevent you from having lung cancer treatment.  Fecal occult  blood test (FOBT) of stool. / Every year beginning at age 12 and continuing until age 24. You may not have to do this test if you get a colonoscopy every 10 years.  Flexible sigmoidoscopy** or colonoscopy.** / Every 5 years for a flexible sigmoidoscopy or every 10 years for a colonoscopy beginning at age 5 and continuing until age 37.  Hepatitis C blood test.** / For all people born from [birth year] through 1967 and any individual with known risks for hepatitis C.  Abdominal aortic aneurysm (AAA) screening.** / A one-time screening for ages 37 to 40 years who are current or former smokers.  Skin self-exam. / Monthly.  Influenza vaccine. / Every year.  Tetanus, diphtheria, and acellular pertussis (Tdap/Td) vaccine.** / 1 dose of Td every 10 years.  Varicella vaccine.** / Consult your health care provider.  Zoster vaccine.** / 1 dose for adults aged 20 years or older.  Pneumococcal 13-valent conjugate (PCV13) vaccine.** / Consult your health care provider.  Pneumococcal polysaccharide (PPSV23) vaccine.** / 1 dose for all adults aged 50 years and older.  Meningococcal vaccine.** / Consult your health care provider.  Hepatitis A vaccine.** / Consult your health care provider.  Hepatitis B vaccine.** / Consult your health care provider.  Haemophilus influenzae type b (Hib) vaccine.** / Consult your health care provider. **Family history and personal history of risk and conditions may change your health care provider's recommendations. Document Released: 08/09/2001 Document Revised: 06/18/2013 Document Reviewed: 11/08/2010 [facility] Patient Information 2015 [place], [place]. This information is not intended to replace advice given to you by your health care provider. Make sure you discuss any questions you have with your health care provider.

## 2014-04-30 NOTE — Assessment & Plan Note (Signed)
Discussed risks/benefits of DRE and PSA testing.  Patient asymptomatic.  Declines DRE.  Would like PSA.  Will check PSA today.

## 2014-04-30 NOTE — Progress Notes (Signed)
Pre visit review using our clinic review tool, if applicable. No additional management support is needed unless otherwise documented below in the visit note. 

## 2014-04-30 NOTE — Progress Notes (Signed)
Patient presents to clinic today for annual exam.  Patient is fasting for labs.  Acute Concerns: No concerns at today's visit.  Chronic Issues: Hypertension -- BP elevated in clinic today at 149/87. Patient endorses taking medication as directed.  BP at home has been averaging 115-125/70-80.  Denies chest pain, palpitations, lightheadedness, dizziness, vision changes.  Repeat BP measurement with appropriate-sized cuff shows somewhat improved BP.  Health Maintenance: Dental -- up-to-date Vision -- up-to-date Immunizations --  Up-to-date Colonoscopy -- up-to-date.  Due for repeat in 2025.  Past Medical History  Diagnosis Date  . Hyperlipidemia   . Hypothyroidism   . Hypertension   . GERD (gastroesophageal reflux disease)   . Anxiety   . Appendicitis     Past Surgical History  Procedure Laterality Date  . Appendectomy  1971  . Wisdom tooth extraction  1972    Current Outpatient Prescriptions on File Prior to Visit  Medication Sig Dispense Refill  . atorvastatin (LIPITOR) 80 MG tablet Take 80 mg by mouth daily.    . Glucosamine-Chondroit-Vit C-Mn (GLUCOSAMINE 1500 COMPLEX PO) Take by mouth daily.    Marland Kitchen levothyroxine (SYNTHROID, LEVOTHROID) 112 MCG tablet Take 112 mcg by mouth daily before breakfast.    . losartan-hydrochlorothiazide (HYZAAR) 50-12.5 MG per tablet Take 1 tablet by mouth daily.    . Multiple Vitamins-Minerals (CENTRUM SILVER ULTRA MENS) TABS Take by mouth daily.    Marland Kitchen omeprazole (PRILOSEC) 40 MG capsule Take 40 mg by mouth daily.    Marland Kitchen PARoxetine (PAXIL) 20 MG tablet Take 20 mg by mouth daily.    . sildenafil (VIAGRA) 100 MG tablet Take 100 mg by mouth daily as needed.     . testosterone (ANDROGEL) 50 MG/5GM (1%) GEL Place 5 g onto the skin daily as needed. TESTIM     No current facility-administered medications on file prior to visit.    Allergies  Allergen Reactions  . Codeine Nausea Only  . Niacin And Related     Family History  Problem Relation  Age of Onset  . Stroke Mother 61    Deceased  . Heart attack Mother   . Lymphoma Father 25    Deceased  . Heart disease Father   . Stroke Maternal Grandmother   . Stomach cancer Paternal Grandfather   . Healthy Brother     x2  . Healthy Sister   . Hypertension Daughter   . Healthy Daughter     History   Social History  . Marital Status: Married    Spouse Name: N/A    Number of Children: N/A  . Years of Education: N/A   Occupational History  . Not on file.   Social History Main Topics  . Smoking status: Former Smoker    Types: Cigarettes    Quit date: 06/28/1979  . Smokeless tobacco: Never Used  . Alcohol Use: Not on file  . Drug Use: Not on file  . Sexual Activity: Not on file   Other Topics Concern  . Not on file   Social History Narrative   Review of Systems  Constitutional: Negative for fever and weight loss.  HENT: Negative for ear discharge, ear pain, hearing loss and tinnitus.   Eyes: Negative for blurred vision, double vision, photophobia and pain.  Respiratory: Negative for shortness of breath.   Cardiovascular: Negative for chest pain and palpitations.  Gastrointestinal: Negative for heartburn, nausea, vomiting, abdominal pain, diarrhea, constipation, blood in stool and melena.  Genitourinary: Negative for dysuria, urgency, frequency,  hematuria and flank pain.  Neurological: Negative for dizziness, loss of consciousness and headaches.  Psychiatric/Behavioral: Negative for depression, suicidal ideas, hallucinations and substance abuse. The patient is not nervous/anxious and does not have insomnia.    BP 149/87 mmHg  Pulse 65  Temp(Src) 98.2 F (36.8 C) (Oral)  Ht 5\' 11"  (1.803 m)  Wt 209 lb (94.802 kg)  BMI 29.16 kg/m2  SpO2 99%  Physical Exam  Constitutional: He is oriented to person, place, and time and well-developed, well-nourished, and in no distress.  HENT:  Head: Normocephalic and atraumatic.  Right Ear: Tympanic membrane, external ear  and ear canal normal.  Left Ear: Tympanic membrane, external ear and ear canal normal.  Nose: Nose normal.  Mouth/Throat: Uvula is midline, oropharynx is clear and moist and mucous membranes are normal.  Eyes: Conjunctivae and EOM are normal. Pupils are equal, round, and reactive to light.  Neck: Neck supple.  Cardiovascular: Normal rate, regular rhythm, normal heart sounds and intact distal pulses.   Pulmonary/Chest: Effort normal and breath sounds normal. No respiratory distress. He has no wheezes. He has no rales. He exhibits no tenderness.  Abdominal: Soft. Bowel sounds are normal. He exhibits no distension and no mass. There is no tenderness. There is no rebound and no guarding.  Genitourinary:  Defers DRE for prostate examination.  Lymphadenopathy:    He has no cervical adenopathy.  Neurological: He is alert and oriented to person, place, and time.  Skin: Skin is warm and dry. No rash noted.  Psychiatric: Affect normal.  Vitals reviewed.  Assessment/Plan: Screening for ischemic heart disease EKG obtained revealing NSR.  81 mg ASA discussed with patient.  Continue Lipitor.  Will obtain fasting lipid panel.  Prostate cancer screening encounter, options and risks discussed Discussed risks/benefits of DRE and PSA testing.  Patient asymptomatic.  Declines DRE.  Would like PSA.  Will check PSA today.  Visit for preventive health examination History reviewed and updated.  Patient up-to-date on health maintenance including immunizations and colonoscopy.  Patient received Prevnar at last visit.  Per CDC guidelines, will be due for Pneumovax next year.  Will obtain fasting labs at today's visit.  Essential hypertension, benign Suspect some White Coat Syndrome as home BP measurements given look good.  DASH handout given.  Will routinely monitor BP.

## 2014-04-30 NOTE — Assessment & Plan Note (Signed)
EKG obtained revealing NSR.  81 mg ASA discussed with patient.  Continue Lipitor.  Will obtain fasting lipid panel.

## 2014-04-30 NOTE — Assessment & Plan Note (Signed)
History reviewed and updated.  Patient up-to-date on health maintenance including immunizations and colonoscopy.  Patient received Prevnar at last visit.  Per CDC guidelines, will be due for Pneumovax next year.  Will obtain fasting labs at today's visit.

## 2014-04-30 NOTE — Assessment & Plan Note (Signed)
Suspect some White Coat Syndrome as home BP measurements given look good.  DASH handout given.  Will routinely monitor BP.

## 2014-06-04 ENCOUNTER — Ambulatory Visit (INDEPENDENT_AMBULATORY_CARE_PROVIDER_SITE_OTHER): Payer: 59 | Admitting: Family Medicine

## 2014-06-04 ENCOUNTER — Encounter: Payer: Self-pay | Admitting: Family Medicine

## 2014-06-04 VITALS — BP 168/96 | HR 68 | Temp 97.8°F | Resp 16 | Wt 228.0 lb

## 2014-06-04 DIAGNOSIS — I10 Essential (primary) hypertension: Secondary | ICD-10-CM

## 2014-06-04 MED ORDER — VALSARTAN-HYDROCHLOROTHIAZIDE 160-12.5 MG PO TABS: 1.0000 | ORAL_TABLET | Freq: Every day | ORAL | Status: DC

## 2014-06-04 NOTE — Patient Instructions (Signed)
Follow up in 2-3 weeks to recheck BP STOP the Losartan START the Valsartan daily Limit your salt intake Continue to get regular exercise- this is great for BP and as a stress outlet Call with any questions or concerns Happy Holidays!!!

## 2014-06-04 NOTE — Progress Notes (Signed)
Pre visit review using our clinic review tool, if applicable. No additional management support is needed unless otherwise documented below in the visit note. 

## 2014-06-04 NOTE — Progress Notes (Signed)
   Subjective:    Patient ID: Darius Mitchell, male    DOB: 08/25/1950, 63 y.o.   MRN: [id number]  HPI HTN- chronic problem.  Pt reports he has typically been 'dead on 120/70'.  Has been on Losartan HCTZ x10 yrs due to periodic elevations to 130.  + family hx CAD.  Admits to increased stress w/ home and work.  Pt reports he will have light headedness occuring around mid day x4-5 days.  Pt reports home BP was '170 over something'.  No CP, SOB, HAs, visual changes, edema.  Review of Systems For ROS see HPI     Objective:   Physical Exam  Constitutional: He is oriented to person, place, and time. He appears well-developed and well-nourished. No distress.  HENT:  Head: Normocephalic and atraumatic.  Eyes: Conjunctivae and EOM are normal. Pupils are equal, round, and reactive to light.  Neck: Normal range of motion. Neck supple. No thyromegaly present.  Cardiovascular: Normal rate, regular rhythm, normal heart sounds and intact distal pulses.   No murmur heard. Pulmonary/Chest: Effort normal and breath sounds normal. No respiratory distress.  Abdominal: Soft. Bowel sounds are normal. He exhibits no distension.  Musculoskeletal: He exhibits no edema.  Lymphadenopathy:    He has no cervical adenopathy.  Neurological: He is alert and oriented to person, place, and time. No cranial nerve deficit.  Skin: Skin is warm and dry.  Psychiatric: He has a normal mood and affect. His behavior is normal.  Vitals reviewed.         Assessment & Plan:

## 2014-06-05 ENCOUNTER — Encounter: Payer: Self-pay | Admitting: General Practice

## 2014-06-05 LAB — CBC WITH DIFFERENTIAL/PLATELET
BASOS ABS: 0 10*3/uL (ref 0.0–0.1)
Basophils Relative: 0.5 % (ref 0.0–3.0)
EOS ABS: 0.3 10*3/uL (ref 0.0–0.7)
Eosinophils Relative: 3.9 % (ref 0.0–5.0)
HCT: 42.3 % (ref 39.0–52.0)
HEMOGLOBIN: 14.2 g/dL (ref 13.0–17.0)
Lymphocytes Relative: 20 % (ref 12.0–46.0)
Lymphs Abs: 1.4 10*3/uL (ref 0.7–4.0)
MCHC: 33.4 g/dL (ref 30.0–36.0)
MCV: 91.7 fl (ref 78.0–100.0)
MONOS PCT: 6 % (ref 3.0–12.0)
Monocytes Absolute: 0.4 10*3/uL (ref 0.1–1.0)
NEUTROS ABS: 4.8 10*3/uL (ref 1.4–7.7)
Neutrophils Relative %: 69.6 % (ref 43.0–77.0)
Platelets: 170 10*3/uL (ref 150.0–400.0)
RBC: 4.61 Mil/uL (ref 4.22–5.81)
RDW: 13.5 % (ref 11.5–15.5)
WBC: 6.9 10*3/uL (ref 4.0–10.5)

## 2014-06-05 LAB — BASIC METABOLIC PANEL
BUN: 20 mg/dL (ref 6–23)
CO2: 28 meq/L (ref 19–32)
Calcium: 9.3 mg/dL (ref 8.4–10.5)
Chloride: 103 mEq/L (ref 96–112)
Creatinine, Ser: 1 mg/dL (ref 0.4–1.5)
GFR: 79.6 mL/min (ref >60.00)
GLUCOSE: 90 mg/dL (ref 70–99)
POTASSIUM: 3.6 meq/L (ref 3.5–5.1)
SODIUM: 139 meq/L (ref 135–145)

## 2014-06-08 NOTE — Assessment & Plan Note (Addendum)
Deteriorated.  Pt's BP has been elevated at home.  Pt reports increased stress recently.  Recommended switching ARB to more potent Valsartan and pt continue to work on healthy diet, regular exercise, low Na, and stress outlet.  Check labs to assess CBC and BMP in setting of increasing ARB.  Reviewed supportive care and red flags that should prompt return.  Pt expressed understanding and is in agreement w/ plan.

## 2014-06-13 ENCOUNTER — Encounter: Payer: Self-pay | Admitting: Physician Assistant

## 2014-06-13 ENCOUNTER — Ambulatory Visit (INDEPENDENT_AMBULATORY_CARE_PROVIDER_SITE_OTHER): Payer: 59 | Admitting: Physician Assistant

## 2014-06-13 VITALS — BP 160/98 | HR 66 | Temp 97.5°F | Resp 16 | Wt 215.1 lb

## 2014-06-13 DIAGNOSIS — I10 Essential (primary) hypertension: Secondary | ICD-10-CM

## 2014-06-13 MED ORDER — VALSARTAN-HYDROCHLOROTHIAZIDE 160-25 MG PO TABS: 1.0000 | ORAL_TABLET | Freq: Every day | ORAL | Status: DC

## 2014-06-13 NOTE — Progress Notes (Signed)
Pre visit review using our clinic review tool, if applicable. No additional management support is needed unless otherwise documented below in the visit note. 

## 2014-06-13 NOTE — Assessment & Plan Note (Signed)
Increase Diovan to 160-25 mg daily.  DASH diet discussed.  Follow-up in 2 weeks.

## 2014-06-13 NOTE — Progress Notes (Signed)
Patient presents to clinic today c/o continued elevated BP measurements as high as 180/114 with new medication Diovan-HCT 160-12.5 mg daily.  BP currently at 160/98.  Denies chest pain, palpitations, lightheadedness or dizziness.  Denies change to diet.  Past Medical History  Diagnosis Date  . Hyperlipidemia   . Hypothyroidism   . Hypertension   . GERD (gastroesophageal reflux disease)   . Anxiety   . Appendicitis     Current Outpatient Prescriptions on File Prior to Visit  Medication Sig Dispense Refill  . atorvastatin (LIPITOR) 80 MG tablet Take 80 mg by mouth daily.    . Glucosamine-Chondroit-Vit C-Mn (GLUCOSAMINE 1500 COMPLEX PO) Take by mouth daily.    Marland Kitchen levothyroxine (SYNTHROID, LEVOTHROID) 112 MCG tablet Take 112 mcg by mouth daily before breakfast.    . Multiple Vitamins-Minerals (CENTRUM SILVER ULTRA MENS) TABS Take by mouth daily.    Marland Kitchen omeprazole (PRILOSEC) 40 MG capsule Take 40 mg by mouth daily.    Marland Kitchen PARoxetine (PAXIL) 20 MG tablet Take 20 mg by mouth daily.    . sildenafil (VIAGRA) 100 MG tablet Take 100 mg by mouth daily as needed.     . testosterone (ANDROGEL) 50 MG/5GM (1%) GEL Place 5 g onto the skin daily as needed. TESTIM     No current facility-administered medications on file prior to visit.    Allergies  Allergen Reactions  . Codeine Nausea Only  . Niacin And Related     Family History  Problem Relation Age of Onset  . Stroke Mother 16    Deceased  . Heart attack Mother   . Lymphoma Father 60    Deceased  . Heart disease Father   . Stroke Maternal Grandmother   . Stomach cancer Paternal Grandfather   . Healthy Brother     x2  . Healthy Sister   . Hypertension Daughter   . Healthy Daughter     History   Social History  . Marital Status: Married    Spouse Name: N/A    Number of Children: N/A  . Years of Education: N/A   Social History Main Topics  . Smoking status: Former Smoker    Types: Cigarettes    Quit date: 06/28/1979  .  Smokeless tobacco: Never Used  . Alcohol Use: None  . Drug Use: None  . Sexual Activity: None   Other Topics Concern  . None   Social History Narrative   Review of Systems - See HPI.  All other ROS are negative.  BP 160/98 mmHg  Pulse 66  Temp(Src) 97.5 F (36.4 C) (Oral)  Resp 16  Wt 215 lb 2 oz (97.58 kg)  SpO2 98%  Physical Exam  Constitutional: He is oriented to person, place, and time and well-developed, well-nourished, and in no distress.  HENT:  Head: Normocephalic and atraumatic.  Eyes: Conjunctivae are normal.  Cardiovascular: Normal rate, regular rhythm, normal heart sounds and intact distal pulses.   Pulmonary/Chest: Effort normal and breath sounds normal. No respiratory distress. He has no wheezes. He has no rales. He exhibits no tenderness.  Neurological: He is alert and oriented to person, place, and time.  Skin: Skin is warm and dry. No rash noted.  Psychiatric: Affect normal.  Vitals reviewed.  Recent Results (from the past 2160 hour(s))  CBC     Status: Abnormal   Collection Time: 04/30/14  9:21 AM  Result Value Ref Range   WBC 5.8 4.0 - 10.5 K/uL   RBC 4.46 4.22 -  5.81 Mil/uL   Platelets 139.0 (L) 150.0 - 400.0 K/uL   Hemoglobin 13.8 13.0 - 17.0 g/dL   HCT 40.4 39.0 - 52.0 %   MCV 90.6 78.0 - 100.0 fl   MCHC 34.1 30.0 - 36.0 g/dL   RDW 13.0 11.5 - 15.5 %  Hemoglobin A1c     Status: None   Collection Time: 04/30/14  9:21 AM  Result Value Ref Range   Hgb A1c MFr Bld 5.6 4.6 - 6.5 %    Comment: Glycemic Control Guidelines for People with Diabetes:Non Diabetic:  <6%Goal of Therapy: <7%Additional Action Suggested:  >8%   Hepatic function panel     Status: Abnormal   Collection Time: 04/30/14  9:21 AM  Result Value Ref Range   Total Bilirubin 1.3 (H) 0.2 - 1.2 mg/dL   Bilirubin, Direct 0.2 0.0 - 0.3 mg/dL   Alkaline Phosphatase 67 39 - 117 U/L   AST 24 0 - 37 U/L   ALT 26 0 - 53 U/L   Total Protein 7.1 6.0 - 8.3 g/dL   Albumin 3.6 3.5 - 5.2 g/dL    TSH     Status: None   Collection Time: 04/30/14  9:21 AM  Result Value Ref Range   TSH 2.39 0.35 - 4.50 uIU/mL  Urinalysis, Routine w reflex microscopic     Status: Abnormal   Collection Time: 04/30/14  9:21 AM  Result Value Ref Range   Color, Urine YELLOW Yellow;Lt. Yellow   APPearance CLEAR Clear   Specific Gravity, Urine 1.010 1.000-1.030   pH 7.0 5.0 - 8.0   Total Protein, Urine NEGATIVE Negative   Urine Glucose NEGATIVE Negative   Ketones, ur NEGATIVE Negative   Bilirubin Urine NEGATIVE Negative   Hgb urine dipstick TRACE-LYSED (A) Negative   Urobilinogen, UA 0.2 0.0 - 1.0   Leukocytes, UA NEGATIVE Negative   Nitrite NEGATIVE Negative   WBC, UA none seen 0-2/hpf   RBC / HPF none seen 0-2/hpf  PSA     Status: None   Collection Time: 04/30/14  9:21 AM  Result Value Ref Range   PSA 0.36 0.10 - 4.00 ng/mL  Basic metabolic panel     Status: None   Collection Time: 06/04/14  4:03 PM  Result Value Ref Range   Sodium 139 135 - 145 mEq/L   Potassium 3.6 3.5 - 5.1 mEq/L   Chloride 103 96 - 112 mEq/L   CO2 28 19 - 32 mEq/L   Glucose, Bld 90 70 - 99 mg/dL   BUN 20 6 - 23 mg/dL   Creatinine, Ser 1.0 0.4 - 1.5 mg/dL   Calcium 9.3 8.4 - 10.5 mg/dL   GFR 79.60 >60.00 mL/min  CBC with Differential     Status: None   Collection Time: 06/04/14  4:03 PM  Result Value Ref Range   WBC 6.9 4.0 - 10.5 K/uL   RBC 4.61 4.22 - 5.81 Mil/uL   Hemoglobin 14.2 13.0 - 17.0 g/dL   HCT 42.3 39.0 - 52.0 %   MCV 91.7 78.0 - 100.0 fl   MCHC 33.4 30.0 - 36.0 g/dL   RDW 13.5 11.5 - 15.5 %   Platelets 170.0 150.0 - 400.0 K/uL   Neutrophils Relative % 69.6 43.0 - 77.0 %   Lymphocytes Relative 20.0 12.0 - 46.0 %   Monocytes Relative 6.0 3.0 - 12.0 %   Eosinophils Relative 3.9 0.0 - 5.0 %   Basophils Relative 0.5 0.0 - 3.0 %   Neutro Abs  4.8 1.4 - 7.7 K/uL   Lymphs Abs 1.4 0.7 - 4.0 K/uL   Monocytes Absolute 0.4 0.1 - 1.0 K/uL   Eosinophils Absolute 0.3 0.0 - 0.7 K/uL   Basophils Absolute 0.0  0.0 - 0.1 K/uL    Assessment/Plan: Essential hypertension, benign Increase Diovan to 160-25 mg daily.  DASH diet discussed.  Follow-up in 2 weeks.

## 2014-06-13 NOTE — Patient Instructions (Signed)
Please take the new medication dose as directed.  Watch your salt intake.  Stay active.  Continue BP checks every couple of days or if you are having symptoms.  Write down. Follow-up in 2 months if BP improving.  DASH Eating Plan DASH stands for "Dietary Approaches to Stop Hypertension." The DASH eating plan is a healthy eating plan that has been shown to reduce high blood pressure (hypertension). Additional health benefits may include reducing the risk of type 2 diabetes mellitus, heart disease, and stroke. The DASH eating plan may also help with weight loss. WHAT DO I NEED TO KNOW ABOUT THE DASH EATING PLAN? For the DASH eating plan, you will follow these general guidelines:  Choose foods with a percent daily value for sodium of less than 5% (as listed on the food label).  Use salt-free seasonings or herbs instead of table salt or sea salt.  Check with your health care provider or pharmacist before using salt substitutes.  Eat lower-sodium products, often labeled as "lower sodium" or "no salt added."  Eat fresh foods.  Eat more vegetables, fruits, and low-fat dairy products.  Choose whole grains. Look for the word "whole" as the first word in the ingredient list.  Choose fish and skinless chicken or [place] more often than red meat. Limit fish, poultry, and meat to 6 oz (170 g) each day.  Limit sweets, desserts, sugars, and sugary drinks.  Choose heart-healthy fats.  Limit cheese to 1 oz (28 g) per day.  Eat more home-cooked food and less restaurant, buffet, and fast food.  Limit fried foods.  Cook foods using methods other than frying.  Limit canned vegetables. If you do use them, rinse them well to decrease the sodium.  When eating at a restaurant, ask that your food be prepared with less salt, or no salt if possible. WHAT FOODS CAN I EAT? Seek help from a dietitian for individual calorie needs. Grains Whole grain or whole wheat bread. Brown rice. Whole grain or whole  wheat pasta. Quinoa, bulgur, and whole grain cereals. Low-sodium cereals. Corn or whole wheat flour tortillas. Whole grain cornbread. Whole grain crackers. Low-sodium crackers. Vegetables Fresh or frozen vegetables (raw, steamed, roasted, or grilled). Low-sodium or reduced-sodium tomato and vegetable juices. Low-sodium or reduced-sodium tomato sauce and paste. Low-sodium or reduced-sodium canned vegetables.  Fruits All fresh, canned (in natural juice), or frozen fruits. Meat and Other Protein Products Ground beef (85% or leaner), grass-fed beef, or beef trimmed of fat. Skinless chicken or [place]. Ground chicken or [place]. Pork trimmed of fat. All fish and seafood. Eggs. Dried beans, peas, or lentils. Unsalted nuts and seeds. Unsalted canned beans. Dairy Low-fat dairy products, such as skim or 1% milk, 2% or reduced-fat cheeses, low-fat ricotta or cottage cheese, or plain low-fat yogurt. Low-sodium or reduced-sodium cheeses. Fats and Oils Tub margarines without trans fats. Light or reduced-fat mayonnaise and salad dressings (reduced sodium). Avocado. Safflower, olive, or canola oils. Natural peanut or almond butter. Other Unsalted popcorn and pretzels. The items listed above may not be a complete list of recommended foods or beverages. Contact your dietitian for more options. WHAT FOODS ARE NOT RECOMMENDED? Grains White bread. White pasta. White rice. Refined cornbread. Bagels and croissants. Crackers that contain trans fat. Vegetables Creamed or fried vegetables. Vegetables in a cheese sauce. Regular canned vegetables. Regular canned tomato sauce and paste. Regular tomato and vegetable juices. Fruits Dried fruits. Canned fruit in light or heavy syrup. Fruit juice. Meat and Other Protein Products Fatty cuts  of meat. Ribs, chicken wings, bacon, sausage, bologna, salami, chitterlings, fatback, hot dogs, bratwurst, and packaged luncheon meats. Salted nuts and seeds. Canned beans with  salt. Dairy Whole or 2% milk, cream, half-and-half, and cream cheese. Whole-fat or sweetened yogurt. Full-fat cheeses or blue cheese. Nondairy creamers and whipped toppings. Processed cheese, cheese spreads, or cheese curds. Condiments Onion and garlic salt, seasoned salt, table salt, and sea salt. Canned and packaged gravies. Worcestershire sauce. Tartar sauce. Barbecue sauce. Teriyaki sauce. Soy sauce, including reduced sodium. Steak sauce. Fish sauce. Oyster sauce. Cocktail sauce. Horseradish. Ketchup and mustard. Meat flavorings and tenderizers. Bouillon cubes. Hot sauce. Tabasco sauce. Marinades. Taco seasonings. Relishes. Fats and Oils Butter, stick margarine, lard, shortening, ghee, and bacon fat. Coconut, palm kernel, or palm oils. Regular salad dressings. Other Pickles and olives. Salted popcorn and pretzels. The items listed above may not be a complete list of foods and beverages to avoid. Contact your dietitian for more information. WHERE CAN I FIND MORE INFORMATION? National Heart, [employer], and [employer] Institute: travelstabloid.com Document Released: 06/02/2011 Document Revised: 10/28/2013 Document Reviewed: 04/17/2013 [facility] Patient Information 2015 [place], [place]. This information is not intended to replace advice given to you by your health care provider. Make sure you discuss any questions you have with your health care provider.

## 2014-06-24 ENCOUNTER — Ambulatory Visit: Payer: 59 | Admitting: Physician Assistant

## 2014-07-11 ENCOUNTER — Telehealth: Payer: Self-pay | Admitting: Physician Assistant

## 2014-07-11 MED ORDER — PAROXETINE HCL 20 MG PO TABS: 20.0000 mg | ORAL_TABLET | Freq: Every day | ORAL | Status: DC

## 2014-07-11 NOTE — Telephone Encounter (Signed)
Patient's wife came into office inquiring about Rx request that was called in this morning [01.15.16] and wanted to know "what the hold up was and why medication was not already at the pharmacy"; pt's wife was informed of the 71-69 hour policy on refill request and not to wait until you run out of medication before you request refill any time in the future, as this would not be allowed again d/t being in clinic with patients throughout the day. Rx request to pharmacy/SLS

## 2014-07-11 NOTE — Telephone Encounter (Signed)
Caller name: germain Relation to pt: self Call back number: [phone] Pharmacy: [facility]  Reason for call:   No longer using [facility]. Needs refill of PARoxetine (PAXIL) 20 MG tablet. Patient is out.

## 2014-07-14 ENCOUNTER — Telehealth: Payer: Self-pay | Admitting: Physician Assistant

## 2014-07-14 MED ORDER — LEVOTHYROXINE SODIUM 112 MCG PO TABS: 112.0000 ug | ORAL_TABLET | Freq: Every day | ORAL | Status: DC

## 2014-07-14 NOTE — Telephone Encounter (Signed)
Caller name: [place], [place]iam Relation to pt: self  Call back number: [phone] Pharmacy: [facility]   Reason for call:  Pt states he will be in the area today picking up another RX requesting a refill levothyroxine (SYNTHROID, LEVOTHROID) 112 MCG tablet please send to  [place], [place] [phone] (Phone)

## 2014-07-14 NOTE — Telephone Encounter (Signed)
Rx request to pharmacy/SLS  

## 2014-08-15 ENCOUNTER — Encounter: Payer: Self-pay | Admitting: Physician Assistant

## 2014-08-15 ENCOUNTER — Ambulatory Visit (INDEPENDENT_AMBULATORY_CARE_PROVIDER_SITE_OTHER): Payer: 59 | Admitting: Physician Assistant

## 2014-08-15 VITALS — BP 112/69 | HR 69 | Temp 98.1°F | Resp 16 | Ht 71.0 in | Wt 215.5 lb

## 2014-08-15 DIAGNOSIS — I10 Essential (primary) hypertension: Secondary | ICD-10-CM

## 2014-08-15 MED ORDER — VALSARTAN-HYDROCHLOROTHIAZIDE 160-25 MG PO TABS: 1.0000 | ORAL_TABLET | Freq: Every day | ORAL | Status: DC

## 2014-08-15 NOTE — Progress Notes (Signed)
Patient presents to clinic today for 2 month follow-up of hypertension.  Patient currently on Diovan-HCT 160-25 mg tablet daily for BP. Patient denies chest pain, palpitations, lightheadedness, dizziness, vision changes or frequent headaches.  BP 112/69 in clinic. Is checking BP at home with measurements ranging 100-120/60-70.  Is trying to watch diet.  Past Medical History  Diagnosis Date  . Hyperlipidemia   . Hypothyroidism   . Hypertension   . GERD (gastroesophageal reflux disease)   . Anxiety   . Appendicitis     Current Outpatient Prescriptions on File Prior to Visit  Medication Sig Dispense Refill  . atorvastatin (LIPITOR) 80 MG tablet Take 80 mg by mouth daily.    . Glucosamine-Chondroit-Vit C-Mn (GLUCOSAMINE 1500 COMPLEX PO) Take by mouth daily.    Marland Kitchen levothyroxine (SYNTHROID, LEVOTHROID) 112 MCG tablet Take 1 tablet (112 mcg total) by mouth daily before breakfast. 30 tablet 2  . Multiple Vitamins-Minerals (CENTRUM SILVER ULTRA MENS) TABS Take by mouth daily.    Marland Kitchen omeprazole (PRILOSEC) 40 MG capsule Take 40 mg by mouth daily.    Marland Kitchen PARoxetine (PAXIL) 20 MG tablet Take 1 tablet (20 mg total) by mouth daily. 30 tablet 1  . sildenafil (VIAGRA) 100 MG tablet Take 100 mg by mouth daily as needed.     . testosterone (ANDROGEL) 50 MG/5GM (1%) GEL Place 5 g onto the skin daily as needed. TESTIM    . valsartan-hydrochlorothiazide (DIOVAN HCT) 160-25 MG per tablet Take 1 tablet by mouth daily. 30 tablet 3   No current facility-administered medications on file prior to visit.    Allergies  Allergen Reactions  . Codeine Nausea Only  . Niacin And Related     Family History  Problem Relation Age of Onset  . Stroke Mother 5    Deceased  . Heart attack Mother   . Lymphoma Father 50    Deceased  . Heart disease Father   . Stroke Maternal Grandmother   . Stomach cancer Paternal Grandfather   . Healthy Brother     x2  . Healthy Sister   . Hypertension Daughter   . Healthy  Daughter     History   Social History  . Marital Status: Married    Spouse Name: N/A  . Number of Children: N/A  . Years of Education: N/A   Social History Main Topics  . Smoking status: Former Smoker    Types: Cigarettes    Quit date: 06/28/1979  . Smokeless tobacco: Never Used  . Alcohol Use: Not on file  . Drug Use: Not on file  . Sexual Activity: Not on file   Other Topics Concern  . None   Social History Narrative   Review of Systems - See HPI.  All other ROS are negative.  BP 112/69 mmHg  Pulse 69  Temp(Src) 98.1 F (36.7 C) (Oral)  Resp 16  Ht 5\' 11"  (1.803 m)  Wt 215 lb 8 oz (97.75 kg)  BMI 30.07 kg/m2  SpO2 99%  Physical Exam  Constitutional: He is oriented to person, place, and time and well-developed, well-nourished, and in no distress.  HENT:  Head: Normocephalic and atraumatic.  Eyes: Conjunctivae are normal.  Neck: Neck supple.  Cardiovascular: Normal rate, regular rhythm, normal heart sounds and intact distal pulses.   Pulmonary/Chest: Effort normal and breath sounds normal. No respiratory distress. He has no wheezes. He has no rales. He exhibits no tenderness.  Neurological: He is alert and oriented to person, place, and  time.  Skin: Skin is warm and dry. No rash noted.  Psychiatric: Affect normal.    Recent Results (from the past 2160 hour(s))  Basic metabolic panel     Status: None   Collection Time: 06/04/14  4:03 PM  Result Value Ref Range   Sodium 139 135 - 145 mEq/L   Potassium 3.6 3.5 - 5.1 mEq/L   Chloride 103 96 - 112 mEq/L   CO2 28 19 - 32 mEq/L   Glucose, Bld 90 70 - 99 mg/dL   BUN 20 6 - 23 mg/dL   Creatinine, Ser 1.0 0.4 - 1.5 mg/dL   Calcium 9.3 8.4 - 10.5 mg/dL   GFR 79.60 >60.00 mL/min  CBC with Differential     Status: None   Collection Time: 06/04/14  4:03 PM  Result Value Ref Range   WBC 6.9 4.0 - 10.5 K/uL   RBC 4.61 4.22 - 5.81 Mil/uL   Hemoglobin 14.2 13.0 - 17.0 g/dL   HCT 42.3 39.0 - 52.0 %   MCV 91.7 78.0 -  100.0 fl   MCHC 33.4 30.0 - 36.0 g/dL   RDW 13.5 11.5 - 15.5 %   Platelets 170.0 150.0 - 400.0 K/uL   Neutrophils Relative % 69.6 43.0 - 77.0 %   Lymphocytes Relative 20.0 12.0 - 46.0 %   Monocytes Relative 6.0 3.0 - 12.0 %   Eosinophils Relative 3.9 0.0 - 5.0 %   Basophils Relative 0.5 0.0 - 3.0 %   Neutro Abs 4.8 1.4 - 7.7 K/uL   Lymphs Abs 1.4 0.7 - 4.0 K/uL   Monocytes Absolute 0.4 0.1 - 1.0 K/uL   Eosinophils Absolute 0.3 0.0 - 0.7 K/uL   Basophils Absolute 0.0 0.0 - 0.1 K/uL    Assessment/Plan: Essential hypertension, benign Doing well.  Asymptomatic. BP normotensive today in clinic. Medications refilled.  Follow-up in 6 months.

## 2014-08-15 NOTE — Assessment & Plan Note (Signed)
Doing well.  Asymptomatic. BP normotensive today in clinic. Medications refilled.  Follow-up in 6 months.

## 2014-08-15 NOTE — Progress Notes (Signed)
Pre visit review using our clinic review tool, if applicable. No additional management support is needed unless otherwise documented below in the visit note/SLS  

## 2014-08-15 NOTE — Patient Instructions (Signed)
Please continue medications as directed.  Continue watching diet and staying active.  Follow-up in 6 months.  Hypertension Hypertension, commonly called high blood pressure, is when the force of blood pumping through your arteries is too strong. Your arteries are the blood vessels that carry blood from your heart throughout your body. A blood pressure reading consists of a higher number over a lower number, such as 110/72. The higher number (systolic) is the pressure inside your arteries when your heart pumps. The lower number (diastolic) is the pressure inside your arteries when your heart relaxes. Ideally you want your blood pressure below 120/80. Hypertension forces your heart to work harder to pump blood. Your arteries may become narrow or stiff. Having hypertension puts you at risk for heart disease, stroke, and other problems.  RISK FACTORS Some risk factors for high blood pressure are controllable. Others are not.  Risk factors you cannot control include:   Race. You may be at higher risk if you are African American.  Age. Risk increases with age.  Gender. Men are at higher risk than women before age 42 years. After age 2, women are at higher risk than men. Risk factors you can control include:  Not getting enough exercise or physical activity.  Being overweight.  Getting too much fat, sugar, calories, or salt in your diet.  Drinking too much alcohol. SIGNS AND SYMPTOMS Hypertension does not usually cause signs or symptoms. Extremely high blood pressure (hypertensive crisis) may cause headache, anxiety, shortness of breath, and nosebleed. DIAGNOSIS  To check if you have hypertension, your health care provider will measure your blood pressure while you are seated, with your arm held at the level of your heart. It should be measured at least twice using the same arm. Certain conditions can cause a difference in blood pressure between your right and left arms. A blood pressure  reading that is higher than normal on one occasion does not mean that you need treatment. If one blood pressure reading is high, ask your health care provider about having it checked again. TREATMENT  Treating high blood pressure includes making lifestyle changes and possibly taking medicine. Living a healthy lifestyle can help lower high blood pressure. You may need to change some of your habits. Lifestyle changes may include:  Following the DASH diet. This diet is high in fruits, vegetables, and whole grains. It is low in salt, red meat, and added sugars.  Getting at least 2 hours of brisk physical activity every week.  Losing weight if necessary.  Not smoking.  Limiting alcoholic beverages.  Learning ways to reduce stress. If lifestyle changes are not enough to get your blood pressure under control, your health care provider may prescribe medicine. You may need to take more than one. Work closely with your health care provider to understand the risks and benefits. HOME CARE INSTRUCTIONS  Have your blood pressure rechecked as directed by your health care provider.   Take medicines only as directed by your health care provider. Follow the directions carefully. Blood pressure medicines must be taken as prescribed. The medicine does not work as well when you skip doses. Skipping doses also puts you at risk for problems.   Do not smoke.   Monitor your blood pressure at home as directed by your health care provider. SEEK MEDICAL CARE IF:   You think you are having a reaction to medicines taken.  You have recurrent headaches or feel dizzy.  You have swelling in your ankles.  You  have trouble with your vision. SEEK IMMEDIATE MEDICAL CARE IF:  You develop a severe headache or confusion.  You have unusual weakness, numbness, or feel faint.  You have severe chest or abdominal pain.  You vomit repeatedly.  You have trouble breathing. MAKE SURE YOU:   Understand these  instructions.  Will watch your condition.  Will get help right away if you are not doing well or get worse. Document Released: 06/13/2005 Document Revised: 10/28/2013 Document Reviewed: 04/05/2013 [facility] Patient Information 2015 [place], [place]. This information is not intended to replace advice given to you by your health care provider. Make sure you discuss any questions you have with your health care provider.

## 2014-09-05 ENCOUNTER — Other Ambulatory Visit: Payer: Self-pay | Admitting: Physician Assistant

## 2014-09-05 NOTE — Telephone Encounter (Signed)
Medication Detail      Disp Refills Start End     PARoxetine (PAXIL) 20 MG tablet 30 tablet 1 07/11/2014     Sig - Route: Take 1 tablet (20 mg total) by mouth daily. - Oral    E-Prescribing Status: Receipt confirmed by pharmacy (07/11/2014 2:12 PM EST)     Pharmacy    [place] [place] [facility] [place]: 02.19.16 Please Advise on refills/SLS

## 2014-10-02 ENCOUNTER — Other Ambulatory Visit: Payer: Self-pay | Admitting: *Deleted

## 2014-10-02 MED ORDER — ATORVASTATIN CALCIUM 80 MG PO TABS: 80.0000 mg | ORAL_TABLET | Freq: Every day | ORAL | Status: DC

## 2014-10-02 NOTE — Telephone Encounter (Signed)
Rx sent to the pharmacy by e-script.//AB/CMA 

## 2014-10-20 ENCOUNTER — Other Ambulatory Visit: Payer: Self-pay | Admitting: Physician Assistant

## 2014-10-29 ENCOUNTER — Ambulatory Visit: Payer: 59 | Admitting: Physician Assistant

## 2015-01-05 ENCOUNTER — Other Ambulatory Visit: Payer: Self-pay | Admitting: Physician Assistant

## 2015-01-22 ENCOUNTER — Other Ambulatory Visit: Payer: Self-pay | Admitting: Physician Assistant

## 2015-03-23 ENCOUNTER — Telehealth: Payer: Self-pay | Admitting: *Deleted

## 2015-03-23 DIAGNOSIS — I10 Essential (primary) hypertension: Secondary | ICD-10-CM

## 2015-03-23 MED ORDER — VALSARTAN-HYDROCHLOROTHIAZIDE 160-25 MG PO TABS: 1.0000 | ORAL_TABLET | Freq: Every day | ORAL | Status: DC

## 2015-03-23 MED ORDER — PAROXETINE HCL 20 MG PO TABS: 20.0000 mg | ORAL_TABLET | Freq: Every day | ORAL | Status: DC

## 2015-03-23 MED ORDER — ATORVASTATIN CALCIUM 80 MG PO TABS: 80.0000 mg | ORAL_TABLET | Freq: Every day | ORAL | Status: DC

## 2015-03-23 MED ORDER — OMEPRAZOLE 40 MG PO CPDR: 40.0000 mg | DELAYED_RELEASE_CAPSULE | Freq: Every day | ORAL | Status: DC

## 2015-03-23 MED ORDER — LEVOTHYROXINE SODIUM 112 MCG PO TABS: ORAL_TABLET | ORAL | Status: DC

## 2015-03-23 NOTE — Telephone Encounter (Signed)
Rx request to mail order pharmacy/SLS  

## 2015-03-25 ENCOUNTER — Other Ambulatory Visit: Payer: Self-pay | Admitting: Physician Assistant

## 2015-05-14 ENCOUNTER — Encounter: Payer: Self-pay | Admitting: Behavioral Health

## 2015-05-14 ENCOUNTER — Telehealth: Payer: Self-pay | Admitting: Behavioral Health

## 2015-05-14 NOTE — Telephone Encounter (Signed)
Pre-Visit Call completed with patient and chart updated.   Pre-Visit Info documented in Specialty Comments under [employer].    

## 2015-05-15 ENCOUNTER — Encounter: Payer: Self-pay | Admitting: Physician Assistant

## 2015-05-15 ENCOUNTER — Ambulatory Visit (INDEPENDENT_AMBULATORY_CARE_PROVIDER_SITE_OTHER): Payer: 59 | Admitting: Physician Assistant

## 2015-05-15 VITALS — BP 128/84 | HR 85 | Temp 98.1°F | Resp 16 | Ht 71.0 in | Wt 217.5 lb

## 2015-05-15 DIAGNOSIS — I10 Essential (primary) hypertension: Secondary | ICD-10-CM | POA: Diagnosis not present

## 2015-05-15 DIAGNOSIS — E785 Hyperlipidemia, unspecified: Secondary | ICD-10-CM

## 2015-05-15 DIAGNOSIS — R7989 Other specified abnormal findings of blood chemistry: Secondary | ICD-10-CM

## 2015-05-15 DIAGNOSIS — F418 Other specified anxiety disorders: Secondary | ICD-10-CM | POA: Diagnosis not present

## 2015-05-15 DIAGNOSIS — Z23 Encounter for immunization: Secondary | ICD-10-CM

## 2015-05-15 DIAGNOSIS — E291 Testicular hypofunction: Secondary | ICD-10-CM | POA: Diagnosis not present

## 2015-05-15 DIAGNOSIS — Z1159 Encounter for screening for other viral diseases: Secondary | ICD-10-CM

## 2015-05-15 DIAGNOSIS — Z6830 Body mass index (BMI) 30.0-30.9, adult: Secondary | ICD-10-CM

## 2015-05-15 DIAGNOSIS — Z0000 Encounter for general adult medical examination without abnormal findings: Secondary | ICD-10-CM

## 2015-05-15 DIAGNOSIS — F329 Major depressive disorder, single episode, unspecified: Secondary | ICD-10-CM

## 2015-05-15 DIAGNOSIS — E039 Hypothyroidism, unspecified: Secondary | ICD-10-CM | POA: Diagnosis not present

## 2015-05-15 DIAGNOSIS — Z125 Encounter for screening for malignant neoplasm of prostate: Secondary | ICD-10-CM

## 2015-05-15 DIAGNOSIS — F32A Depression, unspecified: Secondary | ICD-10-CM

## 2015-05-15 DIAGNOSIS — F419 Anxiety disorder, unspecified: Secondary | ICD-10-CM

## 2015-05-15 HISTORY — DX: Other specified abnormal findings of blood chemistry: R79.89

## 2015-05-15 HISTORY — DX: Body mass index (BMI) 30.0-30.9, adult: Z68.30

## 2015-05-15 LAB — URINALYSIS, ROUTINE W REFLEX MICROSCOPIC
BILIRUBIN URINE: NEGATIVE
Ketones, ur: NEGATIVE
LEUKOCYTES UA: NEGATIVE
NITRITE: NEGATIVE
PH: 7 (ref 5.0–8.0)
Specific Gravity, Urine: 1.015 (ref 1.000–1.030)
Total Protein, Urine: NEGATIVE
Urine Glucose: NEGATIVE
Urobilinogen, UA: 0.2 (ref 0.0–1.0)
WBC, UA: NONE SEEN (ref 0–?)

## 2015-05-15 LAB — CBC
HEMATOCRIT: 42.4 % (ref 39.0–52.0)
Hemoglobin: 14.1 g/dL (ref 13.0–17.0)
MCHC: 33.3 g/dL (ref 30.0–36.0)
MCV: 92.4 fl (ref 78.0–100.0)
Platelets: 138 10*3/uL — ABNORMAL LOW (ref 150.0–400.0)
RBC: 4.58 Mil/uL (ref 4.22–5.81)
RDW: 13.2 % (ref 11.5–15.5)
WBC: 6.5 10*3/uL (ref 4.0–10.5)

## 2015-05-15 LAB — HEPATITIS C ANTIBODY: HCV AB: NEGATIVE

## 2015-05-15 LAB — LIPID PANEL
CHOL/HDL RATIO: 4
Cholesterol: 180 mg/dL (ref 0–200)
HDL: 40.2 mg/dL (ref >39.00)
LDL Cholesterol: 112 mg/dL — ABNORMAL HIGH (ref 0–99)
NonHDL: 139.93
TRIGLYCERIDES: 139 mg/dL (ref 0.0–149.0)
VLDL: 27.8 mg/dL (ref 0.0–40.0)

## 2015-05-15 LAB — COMPREHENSIVE METABOLIC PANEL
ALBUMIN: 4.4 g/dL (ref 3.5–5.2)
ALT: 25 U/L (ref 0–53)
AST: 22 U/L (ref 0–37)
Alkaline Phosphatase: 81 U/L (ref 39–117)
BUN: 21 mg/dL (ref 6–23)
CHLORIDE: 101 meq/L (ref 96–112)
CO2: 32 meq/L (ref 19–32)
Calcium: 10 mg/dL (ref 8.4–10.5)
Creatinine, Ser: 1.06 mg/dL (ref 0.40–1.50)
GFR: 74.21 mL/min (ref >60.00)
Glucose, Bld: 129 mg/dL — ABNORMAL HIGH (ref 70–99)
POTASSIUM: 4.1 meq/L (ref 3.5–5.1)
SODIUM: 140 meq/L (ref 135–145)
Total Bilirubin: 0.8 mg/dL (ref 0.2–1.2)
Total Protein: 7.6 g/dL (ref 6.0–8.3)

## 2015-05-15 LAB — TSH: TSH: 2.25 u[IU]/mL (ref 0.35–4.50)

## 2015-05-15 LAB — PSA: PSA: 0.23 ng/mL (ref 0.10–4.00)

## 2015-05-15 LAB — HEMOGLOBIN A1C: Hgb A1c MFr Bld: 5.8 % (ref 4.6–6.5)

## 2015-05-15 LAB — TESTOSTERONE: TESTOSTERONE: 201.12 ng/dL — AB (ref 300.00–890.00)

## 2015-05-15 MED ORDER — PAROXETINE HCL 20 MG PO TABS: 20.0000 mg | ORAL_TABLET | Freq: Every day | ORAL | Status: DC

## 2015-05-15 MED ORDER — SILDENAFIL CITRATE 100 MG PO TABS: 100.0000 mg | ORAL_TABLET | Freq: Every day | ORAL | Status: DC | PRN

## 2015-05-15 MED ORDER — OMEPRAZOLE 40 MG PO CPDR: 40.0000 mg | DELAYED_RELEASE_CAPSULE | Freq: Every day | ORAL | Status: DC

## 2015-05-15 MED ORDER — LEVOTHYROXINE SODIUM 112 MCG PO TABS: ORAL_TABLET | ORAL | Status: DC

## 2015-05-15 MED ORDER — VALSARTAN-HYDROCHLOROTHIAZIDE 160-25 MG PO TABS: 1.0000 | ORAL_TABLET | Freq: Every day | ORAL | Status: DC

## 2015-05-15 MED ORDER — ATORVASTATIN CALCIUM 80 MG PO TABS: 80.0000 mg | ORAL_TABLET | Freq: Every day | ORAL | Status: DC

## 2015-05-15 NOTE — Assessment & Plan Note (Signed)
Questionable history. Endorses treatment many years ago. Will check levels today.

## 2015-05-15 NOTE — Assessment & Plan Note (Signed)
Will repeat fasting lipid panel today and CMP. 

## 2015-05-15 NOTE — Assessment & Plan Note (Signed)
Pneumovax given by nursing staff.

## 2015-05-15 NOTE — Assessment & Plan Note (Signed)
Will obtain screening PSA today. 

## 2015-05-15 NOTE — Progress Notes (Signed)
Pre visit review using our clinic review tool, if applicable. No additional management support is needed unless otherwise documented below in the visit note/SLS  

## 2015-05-15 NOTE — Assessment & Plan Note (Signed)
Depression screen negative. Health Maintenance reviewed -- Pneumovax updated. Other immunizations up-to-date. Will obtain Hep C antibody today.. Preventive schedule discussed and handout given in AVS. Will obtain fasting labs today.

## 2015-05-15 NOTE — Assessment & Plan Note (Signed)
Doing very well on current regimen. Will continue the same.

## 2015-05-15 NOTE — Assessment & Plan Note (Signed)
Will increase aerobic exercise and watch diet. Will continue to monitor.

## 2015-05-15 NOTE — Assessment & Plan Note (Signed)
Doing well. Will repeat TSH today. Continue current regimen.

## 2015-05-15 NOTE — Assessment & Plan Note (Signed)
Well-controlled. Asymptomatic. Will continue current regimen. Will check CMP today. 

## 2015-05-15 NOTE — Patient Instructions (Signed)
Please go to the lab for blood work.  I will call you with your results. If your blood work is normal we will follow-up yearly for physicals.  We will treat any abnormal findings and get you in for follow-up.  Please continue medications as directed. I am glad you are doing so well overall.  Preventive Care for Adults, Male A healthy lifestyle and preventive care can promote health and wellness. Preventive health guidelines for men include the following key practices:  A routine yearly physical is a good way to check with your health care provider about your health and preventative screening. It is a chance to share any concerns and updates on your health and to receive a thorough exam.  Visit your dentist for a routine exam and preventative care every 6 months. Brush your teeth twice a day and floss once a day. Good oral hygiene prevents tooth decay and gum disease.  The frequency of eye exams is based on your age, health, family medical history, use of contact lenses, and other factors. Follow your health care provider's recommendations for frequency of eye exams.  Eat a healthy diet. Foods such as vegetables, fruits, whole grains, low-fat dairy products, and lean protein foods contain the nutrients you need without too many calories. Decrease your intake of foods high in solid fats, added sugars, and salt. Eat the right amount of calories for you.Get information about a proper diet from your health care provider, if necessary.  Regular physical exercise is one of the most important things you can do for your health. Most adults should get at least 150 minutes of moderate-intensity exercise (any activity that increases your heart rate and causes you to sweat) each week. In addition, most adults need muscle-strengthening exercises on 2 or more days a week.  Maintain a healthy weight. The body mass index (BMI) is a screening tool to identify possible weight problems. It provides an estimate of  body fat based on height and weight. Your health care provider can find your BMI and can help you achieve or maintain a healthy weight.For adults 20 years and older:  A BMI below 18.5 is considered underweight.  A BMI of 18.5 to 24.9 is normal.  A BMI of 25 to 29.9 is considered overweight.  A BMI of 30 and above is considered obese.  Maintain normal blood lipids and cholesterol levels by exercising and minimizing your intake of saturated fat. Eat a balanced diet with plenty of fruit and vegetables. Blood tests for lipids and cholesterol should begin at age 81 and be repeated every 5 years. If your lipid or cholesterol levels are high, you are over 50, or you are at high risk for heart disease, you may need your cholesterol levels checked more frequently.Ongoing high lipid and cholesterol levels should be treated with medicines if diet and exercise are not working.  If you smoke, find out from your health care provider how to quit. If you do not use tobacco, do not start.  Lung cancer screening is recommended for adults aged 51-80 years who are at high risk for developing lung cancer because of a history of smoking. A yearly low-dose CT scan of the lungs is recommended for people who have at least a 30-pack-year history of smoking and are a current smoker or have quit within the past 15 years. A pack year of smoking is smoking an average of 1 pack of cigarettes a day for 1 year (for example: 1 pack a day for  30 years or 2 packs a day for 15 years). Yearly screening should continue until the smoker has stopped smoking for at least 15 years. Yearly screening should be stopped for people who develop a health problem that would prevent them from having lung cancer treatment.  If you choose to drink alcohol, do not have more than 2 drinks per day. One drink is considered to be 12 ounces (355 mL) of beer, 5 ounces (148 mL) of wine, or 1.5 ounces (44 mL) of liquor.  Avoid use of street drugs. Do not  share needles with anyone. Ask for help if you need support or instructions about stopping the use of drugs.  High blood pressure causes heart disease and increases the risk of stroke. Your blood pressure should be checked at least every 1-2 years. Ongoing high blood pressure should be treated with medicines, if weight loss and exercise are not effective.  If you are 22-77 years old, ask your health care provider if you should take aspirin to prevent heart disease.  Diabetes screening is done by taking a blood sample to check your blood glucose level after you have not eaten for a certain period of time (fasting). If you are not overweight and you do not have risk factors for diabetes, you should be screened once every 3 years starting at age 17. If you are overweight or obese and you are 30-8 years of age, you should be screened for diabetes every year as part of your cardiovascular risk assessment.  Colorectal cancer can be detected and often prevented. Most routine colorectal cancer screening begins at the age of 34 and continues through age 5. However, your health care provider may recommend screening at an earlier age if you have risk factors for colon cancer. On a yearly basis, your health care provider may provide home test kits to check for hidden blood in the stool. Use of a small camera at the end of a tube to directly examine the colon (sigmoidoscopy or colonoscopy) can detect the earliest forms of colorectal cancer. Talk to your health care provider about this at age 63, when routine screening begins. Direct exam of the colon should be repeated every 5-10 years through age 55, unless early forms of precancerous polyps or small growths are found.  People who are at an increased risk for hepatitis B should be screened for this virus. You are considered at high risk for hepatitis B if:  You were born in a country where hepatitis B occurs often. Talk with your health care provider about which  countries are considered high risk.  Your parents were born in a high-risk country and you have not received a shot to protect against hepatitis B (hepatitis B vaccine).  You have HIV or AIDS.  You use needles to inject street drugs.  You live with, or have sex with, someone who has hepatitis B.  You are a man who has sex with other men (MSM).  You get hemodialysis treatment.  You take certain medicines for conditions such as cancer, organ transplantation, and autoimmune conditions.  Hepatitis C blood testing is recommended for all people born from [birth year] through 1965 and any individual with known risks for hepatitis C.  Practice safe sex. Use condoms and avoid high-risk sexual practices to reduce the spread of sexually transmitted infections (STIs). STIs include gonorrhea, chlamydia, syphilis, trichomonas, herpes, HPV, and human immunodeficiency virus (HIV). Herpes, HIV, and HPV are viral illnesses that have no cure. They can result in  disability, cancer, and death.  If you are a man who has sex with other men, you should be screened at least once per year for:  HIV.  Urethral, rectal, and pharyngeal infection of gonorrhea, chlamydia, or both.  If you are at risk of being infected with HIV, it is recommended that you take a prescription medicine daily to prevent HIV infection. This is called preexposure prophylaxis (PrEP). You are considered at risk if:  You are a man who has sex with other men (MSM) and have other risk factors.  You are a heterosexual man, are sexually active, and are at increased risk for HIV infection.  You take drugs by injection.  You are sexually active with a partner who has HIV.  Talk with your health care provider about whether you are at high risk of being infected with HIV. If you choose to begin PrEP, you should first be tested for HIV. You should then be tested every 3 months for as long as you are taking PrEP.  A one-time screening for abdominal  aortic aneurysm (AAA) and surgical repair of large AAAs by ultrasound are recommended for men ages 46 to 81 years who are current or former smokers.  Healthy men should no longer receive prostate-specific antigen (PSA) blood tests as part of routine cancer screening. Talk with your health care provider about prostate cancer screening.  Testicular cancer screening is not recommended for adult males who have no symptoms. Screening includes self-exam, a health care provider exam, and other screening tests. Consult with your health care provider about any symptoms you have or any concerns you have about testicular cancer.  Use sunscreen. Apply sunscreen liberally and repeatedly throughout the day. You should seek shade when your shadow is shorter than you. Protect yourself by wearing long sleeves, pants, a wide-brimmed hat, and sunglasses year round, whenever you are outdoors.  Once a month, do a whole-body skin exam, using a mirror to look at the skin on your back. Tell your health care provider about new moles, moles that have irregular borders, moles that are larger than a pencil eraser, or moles that have changed in shape or color.  Stay current with required vaccines (immunizations).  Influenza vaccine. All adults should be immunized every year.  Tetanus, diphtheria, and acellular pertussis (Td, Tdap) vaccine. An adult who has not previously received Tdap or who does not know his vaccine status should receive 1 dose of Tdap. This initial dose should be followed by tetanus and diphtheria toxoids (Td) booster doses every 10 years. Adults with an unknown or incomplete history of completing a 3-dose immunization series with Td-containing vaccines should begin or complete a primary immunization series including a Tdap dose. Adults should receive a Td booster every 10 years.  Varicella vaccine. An adult without evidence of immunity to varicella should receive 2 doses or a second dose if he has previously  received 1 dose.  Human papillomavirus (HPV) vaccine. Males aged 11-21 years who have not received the vaccine previously should receive the 3-dose series. Males aged 22-26 years may be immunized. Immunization is recommended through the age of 65 years for any male who has sex with males and did not get any or all doses earlier. Immunization is recommended for any person with an immunocompromised condition through the age of 54 years if he did not get any or all doses earlier. During the 3-dose series, the second dose should be obtained 4-8 weeks after the first dose. The third dose should  be obtained 24 weeks after the first dose and 16 weeks after the second dose.  Zoster vaccine. One dose is recommended for adults aged 64 years or older unless certain conditions are present.  Measles, mumps, and rubella (MMR) vaccine. Adults born before [birth year] generally are considered immune to measles and mumps. Adults born in [birth year] or later should have 1 or more doses of MMR vaccine unless there is a contraindication to the vaccine or there is laboratory evidence of immunity to each of the three diseases. A routine second dose of MMR vaccine should be obtained at least 28 days after the first dose for students attending postsecondary schools, health care workers, or international travelers. People who received inactivated measles vaccine or an unknown type of measles vaccine during 1963-1967 should receive 2 doses of MMR vaccine. People who received inactivated mumps vaccine or an unknown type of mumps vaccine before 1979 and are at high risk for mumps infection should consider immunization with 2 doses of MMR vaccine. Unvaccinated health care workers born before [birth year] who lack laboratory evidence of measles, mumps, or rubella immunity or laboratory confirmation of disease should consider measles and mumps immunization with 2 doses of MMR vaccine or rubella immunization with 1 dose of MMR vaccine.  Pneumococcal 13-valent  conjugate (PCV13) vaccine. When indicated, a person who is uncertain of his immunization history and has no record of immunization should receive the PCV13 vaccine. All adults 91 years of age and older should receive this vaccine. An adult aged 86 years or older who has certain medical conditions and has not been previously immunized should receive 1 dose of PCV13 vaccine. This PCV13 should be followed with a dose of pneumococcal polysaccharide (PPSV23) vaccine. Adults who are at high risk for pneumococcal disease should obtain the PPSV23 vaccine at least 8 weeks after the dose of PCV13 vaccine. Adults older than 66 years of age who have normal immune system function should obtain the PPSV23 vaccine dose at least 1 year after the dose of PCV13 vaccine.  Pneumococcal polysaccharide (PPSV23) vaccine. When PCV13 is also indicated, PCV13 should be obtained first. All adults aged 2 years and older should be immunized. An adult younger than age 49 years who has certain medical conditions should be immunized. Any person who resides in a nursing home or long-term care facility should be immunized. An adult smoker should be immunized. People with an immunocompromised condition and certain other conditions should receive both PCV13 and PPSV23 vaccines. People with human immunodeficiency virus (HIV) infection should be immunized as soon as possible after diagnosis. Immunization during chemotherapy or radiation therapy should be avoided. Routine use of PPSV23 vaccine is not recommended for American Indians, [place] Natives, or people younger than 65 years unless there are medical conditions that require PPSV23 vaccine. When indicated, people who have unknown immunization and have no record of immunization should receive PPSV23 vaccine. One-time revaccination 5 years after the first dose of PPSV23 is recommended for people aged 19-64 years who have chronic kidney failure, nephrotic syndrome, asplenia, or immunocompromised  conditions. People who received 1-2 doses of PPSV23 before age 79 years should receive another dose of PPSV23 vaccine at age 62 years or later if at least 5 years have passed since the previous dose. Doses of PPSV23 are not needed for people immunized with PPSV23 at or after age 58 years.  Meningococcal vaccine. Adults with asplenia or persistent complement component deficiencies should receive 2 doses of quadrivalent meningococcal conjugate (MenACWY-D) vaccine. The doses should be  obtained at least 2 months apart. Microbiologists working with certain meningococcal bacteria, [place] recruits, people at risk during an outbreak, and people who travel to or live in countries with a high rate of meningitis should be immunized. A first-year college student up through age 42 years who is living in a residence hall should receive a dose if he did not receive a dose on or after his 16th birthday. Adults who have certain high-risk conditions should receive one or more doses of vaccine.  Hepatitis A vaccine. Adults who wish to be protected from this disease, have chronic liver disease, work with hepatitis A-infected animals, work in hepatitis A research labs, or travel to or work in countries with a high rate of hepatitis A should be immunized. Adults who were previously unvaccinated and who anticipate close contact with an international adoptee during the first 60 days after arrival in the [place] from a country with a high rate of hepatitis A should be immunized.  Hepatitis B vaccine. Adults should be immunized if they wish to be protected from this disease, are under age 91 years and have diabetes, have chronic liver disease, have had more than one sex partner in the past 6 months, may be exposed to blood or other infectious body fluids, are household contacts or sex partners of hepatitis B positive people, are clients or workers in certain care facilities, or travel to or work in countries with a high rate of  hepatitis B.  Haemophilus influenzae type b (Hib) vaccine. A previously unvaccinated person with asplenia or sickle cell disease or having a scheduled splenectomy should receive 1 dose of Hib vaccine. Regardless of previous immunization, a recipient of a hematopoietic stem cell transplant should receive a 3-dose series 6-12 months after his successful transplant. Hib vaccine is not recommended for adults with HIV infection. Preventive Service / Frequency Ages 44 to 33  Blood pressure check.** / Every 3-5 years.  Lipid and cholesterol check.** / Every 5 years beginning at age 88.  Hepatitis C blood test.** / For any individual with known risks for hepatitis C.  Skin self-exam. / Monthly.  Influenza vaccine. / Every year.  Tetanus, diphtheria, and acellular pertussis (Tdap, Td) vaccine.** / Consult your health care provider. 1 dose of Td every 10 years.  Varicella vaccine.** / Consult your health care provider.  HPV vaccine. / 3 doses over 6 months, if 82 or younger.  Measles, mumps, rubella (MMR) vaccine.** / You need at least 1 dose of MMR if you were born in 1957 or later. You may also need a second dose.  Pneumococcal 13-valent conjugate (PCV13) vaccine.** / Consult your health care provider.  Pneumococcal polysaccharide (PPSV23) vaccine.** / 1 to 2 doses if you smoke cigarettes or if you have certain conditions.  Meningococcal vaccine.** / 1 dose if you are age 37 to 79 years and a [employer] researcher living in a residence hall, or have one of several medical conditions. You may also need additional booster doses.  Hepatitis A vaccine.** / Consult your health care provider.  Hepatitis B vaccine.** / Consult your health care provider.  Haemophilus influenzae type b (Hib) vaccine.** / Consult your health care provider. Ages 59 to 74  Blood pressure check.** / Every year.  Lipid and cholesterol check.** / Every 5 years beginning at age 52.  Lung cancer screening. /  Every year if you are aged 93-80 years and have a 30-pack-year history of smoking and currently smoke or have quit within the  past 15 years. Yearly screening is stopped once you have quit smoking for at least 15 years or develop a health problem that would prevent you from having lung cancer treatment.  Fecal occult blood test (FOBT) of stool. / Every year beginning at age 1 and continuing until age 6. You may not have to do this test if you get a colonoscopy every 10 years.  Flexible sigmoidoscopy** or colonoscopy.** / Every 5 years for a flexible sigmoidoscopy or every 10 years for a colonoscopy beginning at age 51 and continuing until age 69.  Hepatitis C blood test.** / For all people born from [birth year] through 1965 and any individual with known risks for hepatitis C.  Skin self-exam. / Monthly.  Influenza vaccine. / Every year.  Tetanus, diphtheria, and acellular pertussis (Tdap/Td) vaccine.** / Consult your health care provider. 1 dose of Td every 10 years.  Varicella vaccine.** / Consult your health care provider.  Zoster vaccine.** / 1 dose for adults aged 52 years or older.  Measles, mumps, rubella (MMR) vaccine.** / You need at least 1 dose of MMR if you were born in 1959 or later. You may also need a second dose.  Pneumococcal 13-valent conjugate (PCV13) vaccine.** / Consult your health care provider.  Pneumococcal polysaccharide (PPSV23) vaccine.** / 1 to 2 doses if you smoke cigarettes or if you have certain conditions.  Meningococcal vaccine.** / Consult your health care provider.  Hepatitis A vaccine.** / Consult your health care provider.  Hepatitis B vaccine.** / Consult your health care provider.  Haemophilus influenzae type b (Hib) vaccine.** / Consult your health care provider. Ages 33 and over  Blood pressure check.** / Every year.  Lipid and cholesterol check.**/ Every 5 years beginning at age 69.  Lung cancer screening. / Every year if you are aged 56-80  years and have a 30-pack-year history of smoking and currently smoke or have quit within the past 15 years. Yearly screening is stopped once you have quit smoking for at least 15 years or develop a health problem that would prevent you from having lung cancer treatment.  Fecal occult blood test (FOBT) of stool. / Every year beginning at age 31 and continuing until age 80. You may not have to do this test if you get a colonoscopy every 10 years.  Flexible sigmoidoscopy** or colonoscopy.** / Every 5 years for a flexible sigmoidoscopy or every 10 years for a colonoscopy beginning at age 28 and continuing until age 16.  Hepatitis C blood test.** / For all people born from [birth year] through 1967 and any individual with known risks for hepatitis C.  Abdominal aortic aneurysm (AAA) screening.** / A one-time screening for ages 70 to 41 years who are current or former smokers.  Skin self-exam. / Monthly.  Influenza vaccine. / Every year.  Tetanus, diphtheria, and acellular pertussis (Tdap/Td) vaccine.** / 1 dose of Td every 10 years.  Varicella vaccine.** / Consult your health care provider.  Zoster vaccine.** / 1 dose for adults aged 28 years or older.  Pneumococcal 13-valent conjugate (PCV13) vaccine.** / 1 dose for all adults aged 36 years and older.  Pneumococcal polysaccharide (PPSV23) vaccine.** / 1 dose for all adults aged 51 years and older.  Meningococcal vaccine.** / Consult your health care provider.  Hepatitis A vaccine.** / Consult your health care provider.  Hepatitis B vaccine.** / Consult your health care provider.  Haemophilus influenzae type b (Hib) vaccine.** / Consult your health care provider. **Family history and personal history of  risk and conditions may change your health care provider's recommendations.   This information is not intended to replace advice given to you by your health care provider. Make sure you discuss any questions you have with your health care  provider.   Document Released: 08/09/2001 Document Revised: 07/04/2014 Document Reviewed: 11/08/2010 [employer]vier Interactive Patient Education [employer].

## 2015-05-15 NOTE — Progress Notes (Signed)
Patient presents to clinic today for annual exam.  Patient is fasting for labs.  Acute Concerns: No acute concerns today. Patient requesting testosterone levels today as he has a questionable history of low testosterone previously. Denies symptoms to include fatigue, confusion, decreased libido.  Chronic Issues: Hyperlipidemia -- Is taking medications as directed without myalgias. Is watching diet and exercise regimen. Is fasting for labs.  Hypothyroidism -- Endorses taking levothyroxine as directed. Has been well controlled on current regimen for some time.  Hypertension -- Is asking Diovan-HCT as directed. Patient denies chest pain, palpitations, lightheadedness, dizziness, vision changes or frequent headaches.  BP Readings from Last 3 Encounters:  05/15/15 128/84  08/15/14 112/69  06/13/14 160/98   GERD -- Is taking Omeprazole 40 mg PRN. Is watching diet. Denies breakthrough symptoms.  Health Maintenance: Dental -- up-to-date Vision -- up-to-date Immunizations -- flu shot, tetanus and Zostavax up-to-date. Colonoscopy -- up-to-date. 02/28/2014.  Past Medical History  Diagnosis Date  . Hyperlipidemia   . Hypothyroidism   . Hypertension   . GERD (gastroesophageal reflux disease)   . Anxiety   . Appendicitis     Past Surgical History  Procedure Laterality Date  . Appendectomy  1971  . Wisdom tooth extraction  1972    Current Outpatient Prescriptions on File Prior to Visit  Medication Sig Dispense Refill  . Glucosamine-Chondroit-Vit C-Mn (GLUCOSAMINE 1500 COMPLEX PO) Take by mouth daily.    . Multiple Vitamins-Minerals (CENTRUM SILVER ULTRA MENS) TABS Take by mouth daily.    Marland Kitchen testosterone (ANDROGEL) 50 MG/5GM (1%) GEL Place 5 g onto the skin daily as needed. TESTIM     No current facility-administered medications on file prior to visit.    Allergies  Allergen Reactions  . Codeine Nausea Only  . Niacin And Related     Family History  Problem Relation Age of  Onset  . Stroke Mother 58    Deceased  . Heart attack Mother   . Lymphoma Father 59    Deceased  . Heart disease Father   . Stroke Maternal Grandmother   . Stomach cancer Paternal Grandfather   . Healthy Brother     x2  . Healthy Sister   . Hypertension Daughter   . Healthy Daughter     Social History   Social History  . Marital Status: Married    Spouse Name: N/A  . Number of Children: N/A  . Years of Education: N/A   Occupational History  . Not on file.   Social History Main Topics  . Smoking status: Former Smoker    Types: Cigarettes    Quit date: 06/28/1979  . Smokeless tobacco: Never Used  . Alcohol Use: Not on file  . Drug Use: Not on file  . Sexual Activity: Not on file   Other Topics Concern  . Not on file   Social History Narrative   Review of Systems  Constitutional: Negative for fever and weight loss.  HENT: Negative for ear discharge, ear pain, hearing loss and tinnitus.   Eyes: Negative for blurred vision, double vision, photophobia and pain.  Respiratory: Negative for cough and shortness of breath.   Cardiovascular: Negative for chest pain and palpitations.  Gastrointestinal: Negative for heartburn, nausea, vomiting, abdominal pain, diarrhea, constipation, blood in stool and melena.  Genitourinary: Negative for dysuria, urgency, frequency, hematuria and flank pain.  Musculoskeletal: Negative for falls.  Neurological: Negative for dizziness, loss of consciousness and headaches.  Endo/Heme/Allergies: Negative for environmental allergies.  Psychiatric/Behavioral: Negative  for depression, suicidal ideas, hallucinations and substance abuse. The patient is not nervous/anxious and does not have insomnia.     BP 128/84 mmHg  Pulse 85  Temp(Src) 98.1 F (36.7 C) (Oral)  Resp 16  Ht _0  (1.803 m)  Wt 217 lb 8 oz (98.657 kg)  BMI 30.35 kg/m2  SpO2 99%  Physical Exam  Constitutional: He is oriented to person, place, and time and well-developed,  well-nourished, and in no distress.  HENT:  Head: Normocephalic and atraumatic.  Right Ear: External ear normal.  Left Ear: External ear normal.  Nose: Nose normal.  Mouth/Throat: Oropharynx is clear and moist. No oropharyngeal exudate.  Eyes: Conjunctivae and EOM are normal. Pupils are equal, round, and reactive to light.  Neck: Neck supple. No thyromegaly present.  Cardiovascular: Normal rate, regular rhythm, normal heart sounds and intact distal pulses.   Pulmonary/Chest: Effort normal and breath sounds normal. No respiratory distress. He has no wheezes. He has no rales. He exhibits no tenderness.  Abdominal: Soft. Bowel sounds are normal. He exhibits no distension and no mass. There is no tenderness. There is no rebound and no guarding.  Genitourinary: Testes/scrotum normal.  Lymphadenopathy:    He has no cervical adenopathy.  Neurological: He is alert and oriented to person, place, and time.  Skin: Skin is warm and dry. No rash noted.  Psychiatric: Affect normal.  Vitals reviewed.   Recent Results (from the past 2160 hour(s))  CBC     Status: Abnormal   Collection Time: 05/15/15  8:33 AM  Result Value Ref Range   WBC 6.5 4.0 - 10.5 K/uL   RBC 4.58 4.22 - 5.81 Mil/uL   Platelets 138.0 (L) 150.0 - 400.0 K/uL   Hemoglobin 14.1 13.0 - 17.0 g/dL   HCT 42.4 39.0 - 52.0 %   MCV 92.4 78.0 - 100.0 fl   MCHC 33.3 30.0 - 36.0 g/dL   RDW 13.2 11.5 - 15.5 %  Comp Met (CMET)     Status: Abnormal   Collection Time: 05/15/15  8:33 AM  Result Value Ref Range   Sodium 140 135 - 145 mEq/L   Potassium 4.1 3.5 - 5.1 mEq/L   Chloride 101 96 - 112 mEq/L   CO2 32 19 - 32 mEq/L   Glucose, Bld 129 (H) 70 - 99 mg/dL   BUN 21 6 - 23 mg/dL   Creatinine, Ser 1.06 0.40 - 1.50 mg/dL   Total Bilirubin 0.8 0.2 - 1.2 mg/dL   Alkaline Phosphatase 81 39 - 117 U/L   AST 22 0 - 37 U/L   ALT 25 0 - 53 U/L   Total Protein 7.6 6.0 - 8.3 g/dL   Albumin 4.4 3.5 - 5.2 g/dL   Calcium 10.0 8.4 - 10.5 mg/dL    GFR 74.21 >60.00 mL/min  TSH     Status: None   Collection Time: 05/15/15  8:33 AM  Result Value Ref Range   TSH 2.25 0.35 - 4.50 uIU/mL  Hemoglobin A1c     Status: None   Collection Time: 05/15/15  8:33 AM  Result Value Ref Range   Hgb A1c MFr Bld 5.8 4.6 - 6.5 %    Comment: Glycemic Control Guidelines for People with Diabetes:Non Diabetic:  <6%Goal of Therapy: <7%Additional Action Suggested:  >8%   Lipid Profile     Status: Abnormal   Collection Time: 05/15/15  8:33 AM  Result Value Ref Range   Cholesterol 180 0 - 200 mg/dL  Comment: ATP III Classification       Desirable:  < 200 mg/dL               Borderline High:  200 - 239 mg/dL          High:  > = 240 mg/dL   Triglycerides 139.0 0.0 - 149.0 mg/dL    Comment: Normal:  <150 mg/dLBorderline High:  150 - 199 mg/dL   HDL 40.20 >39.00 mg/dL   VLDL 27.8 0.0 - 40.0 mg/dL   LDL Cholesterol 112 (H) 0 - 99 mg/dL   Total CHOL/HDL Ratio 4     Comment:                Men          Women1/2 Average Risk     3.4          3.3Average Risk          5.0          4.42X Average Risk          9.6          7.13X Average Risk          15.0          11.0                       NonHDL 139.93     Comment: NOTE:  Non-HDL goal should be 30 mg/dL higher than patient's LDL goal (i.e. LDL goal of < 70 mg/dL, would have non-HDL goal of < 100 mg/dL)  Urinalysis, Routine w reflex microscopic (not at [facility])     Status: Abnormal   Collection Time: 05/15/15  8:33 AM  Result Value Ref Range   Color, Urine YELLOW Yellow;Lt. Yellow   APPearance CLEAR Clear   Specific Gravity, Urine 1.015 1.000-1.030   pH 7.0 5.0 - 8.0   Total Protein, Urine NEGATIVE Negative   Urine Glucose NEGATIVE Negative   Ketones, ur NEGATIVE Negative   Bilirubin Urine NEGATIVE Negative   Hgb urine dipstick SMALL (A) Negative   Urobilinogen, UA 0.2 0.0 - 1.0   Leukocytes, UA NEGATIVE Negative   Nitrite NEGATIVE Negative   WBC, UA none seen 0-2/hpf   RBC / HPF 3-6/hpf (A) 0-2/hpf   Squamous  Epithelial / LPF Rare(0-4/hpf) Rare(0-4/hpf)  PSA     Status: None   Collection Time: 05/15/15  8:33 AM  Result Value Ref Range   PSA 0.23 0.10 - 4.00 ng/mL  Testosterone     Status: Abnormal   Collection Time: 05/15/15  8:33 AM  Result Value Ref Range   Testosterone 201.12 (L) 300.00 - 890.00 ng/dL    Assessment/Plan: Anxiety and depression Doing very well on current regimen. Will continue the same.  Essential hypertension, benign Well-controlled. Asymptomatic. Will continue current regimen. Will check CMP today.  Hyperlipidemia Will repeat fasting lipid panel today and CMP.  Hypothyroidism Doing well. Will repeat TSH today. Continue current regimen.  Low testosterone Questionable history. Endorses treatment many years ago. Will check levels today.  Need for prophylactic vaccination against Streptococcus pneumoniae (pneumococcus) Pneumovax given by nursing staff.  Prostate cancer screening encounter, options and risks discussed Will obtain screening PSA today.  Visit for preventive health examination Depression screen negative. Health Maintenance reviewed -- Pneumovax updated. Other immunizations up-to-date. Will obtain Hep C antibody today.. Preventive schedule discussed and handout given in AVS. Will obtain fasting labs today.   BMI 30.0-30.9,adult Will increase aerobic  exercise and watch diet. Will continue to monitor.

## 2015-05-17 ENCOUNTER — Encounter: Payer: Self-pay | Admitting: Physician Assistant

## 2015-05-19 MED ORDER — SILDENAFIL CITRATE 100 MG PO TABS: 100.0000 mg | ORAL_TABLET | Freq: Every day | ORAL | Status: DC | PRN

## 2015-06-10 ENCOUNTER — Encounter: Payer: Self-pay | Admitting: Medical

## 2015-06-10 ENCOUNTER — Ambulatory Visit (INDEPENDENT_AMBULATORY_CARE_PROVIDER_SITE_OTHER): Payer: 59 | Admitting: Medical

## 2015-06-10 VITALS — BP 126/78 | HR 56 | Temp 98.1°F | Ht 71.0 in | Wt 215.8 lb

## 2015-06-10 DIAGNOSIS — J209 Acute bronchitis, unspecified: Secondary | ICD-10-CM

## 2015-06-10 MED ORDER — AZITHROMYCIN 250 MG PO TABS: ORAL_TABLET | ORAL | Status: DC

## 2015-06-10 MED ORDER — FLUTICASONE PROPIONATE 50 MCG/ACT NA SUSP: 2.0000 | Freq: Every day | NASAL | Status: DC

## 2015-06-10 MED ORDER — BENZONATATE 100 MG PO CAPS: 100.0000 mg | ORAL_CAPSULE | Freq: Three times a day (TID) | ORAL | Status: DC | PRN

## 2015-06-10 NOTE — Progress Notes (Signed)
Subjective:    Patient ID: Darius Mitchell, male    DOB: [birth year], 64 y.o.   MRN: [id number]  HPI   Pt in with cold since last Friday. Nasal congestion, cough, sneezing, mild st, and chest congestion. Pt states sometimes each year will get sick with bronchitis and then get cough that lingers for long time. He expects this to occur again. He want to prevent this from playing out again as usual.  No fevers, no chills or sweats.   Review of Systems  Constitutional: Negative for fever, chills and fatigue.  HENT: Positive for congestion, sneezing and sore throat. Negative for postnasal drip and sinus pressure.        Some sinus congestion.  Respiratory: Positive for cough. Negative for choking, shortness of breath and wheezing.        Some chest congestion  Cardiovascular: Negative for chest pain and palpitations.  Gastrointestinal: Negative for abdominal pain, diarrhea and constipation.  Musculoskeletal: Negative for myalgias and back pain.  Neurological: Negative for dizziness and headaches.  Hematological: Negative for adenopathy. Does not bruise/bleed easily.  Psychiatric/Behavioral: Negative for behavioral problems and confusion.    Past Medical History  Diagnosis Date  . Hyperlipidemia   . Hypothyroidism   . Hypertension   . GERD (gastroesophageal reflux disease)   . Anxiety   . Appendicitis     Social History   Social History  . Marital Status: Married    Spouse Name: N/A  . Number of Children: N/A  . Years of Education: N/A   Occupational History  . Not on file.   Social History Main Topics  . Smoking status: Former Smoker    Types: Cigarettes    Quit date: 06/28/1979  . Smokeless tobacco: Never Used  . Alcohol Use: Not on file  . Drug Use: Not on file  . Sexual Activity: Not on file   Other Topics Concern  . Not on file   Social History Narrative    Past Surgical History  Procedure Laterality Date  . Appendectomy  1971  . Wisdom tooth extraction   1972    Family History  Problem Relation Age of Onset  . Stroke Mother 86    Deceased  . Heart attack Mother   . Lymphoma Father 72    Deceased  . Heart disease Father   . Stroke Maternal Grandmother   . Stomach cancer Paternal Grandfather   . Healthy Brother     x2  . Healthy Sister   . Hypertension Daughter   . Healthy Daughter     Allergies  Allergen Reactions  . Codeine Nausea Only  . Niacin And Related     Current Outpatient Prescriptions on File Prior to Visit  Medication Sig Dispense Refill  . atorvastatin (LIPITOR) 80 MG tablet Take 1 tablet (80 mg total) by mouth daily. 90 tablet 1  . Glucosamine-Chondroit-Vit C-Mn (GLUCOSAMINE 1500 COMPLEX PO) Take by mouth daily.    Marland Kitchen levothyroxine (SYNTHROID, LEVOTHROID) 112 MCG tablet TAKE 1 TABLET BY MOUTH DAILY BEFORE BREAKFAST 90 tablet 1  . Multiple Vitamins-Minerals (CENTRUM SILVER ULTRA MENS) TABS Take by mouth daily.    Marland Kitchen omeprazole (PRILOSEC) 40 MG capsule Take 1 capsule (40 mg total) by mouth daily. 90 capsule 1  . PARoxetine (PAXIL) 20 MG tablet Take 1 tablet (20 mg total) by mouth daily. 90 tablet 1  . sildenafil (VIAGRA) 100 MG tablet Take 1 tablet (100 mg total) by mouth daily as needed. 10 tablet 1  .  testosterone (ANDROGEL) 50 MG/5GM (1%) GEL Place 5 g onto the skin daily as needed. TESTIM    . valsartan-hydrochlorothiazide (DIOVAN HCT) 160-25 MG tablet Take 1 tablet by mouth daily. 90 tablet 1   No current facility-administered medications on file prior to visit.    BP 126/78 mmHg  Pulse 56  Temp(Src) 98.1 F (36.7 C) (Oral)  Ht 5\' 11"  (1.803 m)  Wt 215 lb 12.8 oz (97.886 kg)  BMI 30.11 kg/m2  SpO2 98%       Objective:   Physical Exam   General  Mental Status - Alert. General Appearance - Well groomed. Not in acute distress.  Skin Rashes- No Rashes.  HEENT Head- Normal. Ear Auditory Canal - Left- Normal. Right - Normal.Tympanic Membrane- Left- Normal. Right- Normal. Eye  Sclera/Conjunctiva- Left- Normal. Right- Normal. Nose & Sinuses Nasal Mucosa- Left-  Boggy and Congested. Right-  Boggy and  Congested.Bilateral maxillary and frontal sinus pressure. Mouth & Throat Lips: Upper Lip- Normal: no dryness, cracking, pallor, cyanosis, or vesicular eruption. Lower Lip-Normal: no dryness, cracking, pallor, cyanosis or vesicular eruption. Buccal Mucosa- Bilateral- No Aphthous ulcers. Oropharynx- No Discharge or Erythema. Tonsils: Characteristics- Bilateral- No Erythema or Congestion. Size/Enlargement- Bilateral- No enlargement. Discharge- bilateral-None.  Neck Neck- Supple. No Masses.   Chest and Lung Exam Auscultation: Breath Sounds:-Clear even and unlabored. Only faint upper lobe rhonchi.  Cardiovascular Auscultation:Rythm- Regular, rate and rhythm. Murmurs & Other Heart Sounds:Ausculatation of the heart reveal- No Murmurs.  Lymphatic Head & Neck General Head & Neck Lymphatics: Bilateral: Description- No Localized lymphadenopathy.      Assessment & Plan:  Your appear to be getting early bronchitis by your recent signs and symptoms.  I will give you flonase rx for nasal congestion, benzonatate for cough, and azithromycin antibiotic.  Follow up in 7 days or as needed

## 2015-06-10 NOTE — Patient Instructions (Signed)
Your appear to be getting early bronchitis by your recent signs and symptoms.  I will give you flonase rx for nasal congestion, benzonatate for cough, and azithromycin antibiotic.  Follow up in 7 days or as needed

## 2015-06-10 NOTE — Progress Notes (Signed)
Pre visit review using our clinic review tool, if applicable. No additional management support is needed unless otherwise documented below in the visit note. 

## 2015-08-10 ENCOUNTER — Encounter: Payer: Self-pay | Admitting: Physician Assistant

## 2015-09-08 ENCOUNTER — Emergency Department (HOSPITAL_BASED_OUTPATIENT_CLINIC_OR_DEPARTMENT_OTHER): Payer: 59

## 2015-09-08 ENCOUNTER — Emergency Department (HOSPITAL_BASED_OUTPATIENT_CLINIC_OR_DEPARTMENT_OTHER)
Admission: EM | Admit: 2015-09-08 | Discharge: 2015-09-08 | Disposition: A | Discharge status: Home / Self Care | Payer: 59 | Attending: Emergency Medicine | Admitting: Emergency Medicine

## 2015-09-08 ENCOUNTER — Encounter (HOSPITAL_BASED_OUTPATIENT_CLINIC_OR_DEPARTMENT_OTHER): Payer: Self-pay | Admitting: Emergency Medicine

## 2015-09-08 DIAGNOSIS — R519 Headache, unspecified: Secondary | ICD-10-CM

## 2015-09-08 DIAGNOSIS — E039 Hypothyroidism, unspecified: Secondary | ICD-10-CM | POA: Diagnosis not present

## 2015-09-08 DIAGNOSIS — K219 Gastro-esophageal reflux disease without esophagitis: Secondary | ICD-10-CM | POA: Insufficient documentation

## 2015-09-08 DIAGNOSIS — R51 Headache: Secondary | ICD-10-CM | POA: Insufficient documentation

## 2015-09-08 DIAGNOSIS — R6884 Jaw pain: Secondary | ICD-10-CM | POA: Diagnosis not present

## 2015-09-08 DIAGNOSIS — Z79899 Other long term (current) drug therapy: Secondary | ICD-10-CM | POA: Insufficient documentation

## 2015-09-08 DIAGNOSIS — E785 Hyperlipidemia, unspecified: Secondary | ICD-10-CM | POA: Diagnosis not present

## 2015-09-08 DIAGNOSIS — Z87891 Personal history of nicotine dependence: Secondary | ICD-10-CM | POA: Insufficient documentation

## 2015-09-08 DIAGNOSIS — F419 Anxiety disorder, unspecified: Secondary | ICD-10-CM | POA: Insufficient documentation

## 2015-09-08 DIAGNOSIS — I10 Essential (primary) hypertension: Secondary | ICD-10-CM | POA: Diagnosis not present

## 2015-09-08 DIAGNOSIS — K0889 Other specified disorders of teeth and supporting structures: Secondary | ICD-10-CM | POA: Insufficient documentation

## 2015-09-08 DIAGNOSIS — Z7951 Long term (current) use of inhaled steroids: Secondary | ICD-10-CM | POA: Insufficient documentation

## 2015-09-08 DIAGNOSIS — R0981 Nasal congestion: Secondary | ICD-10-CM | POA: Diagnosis not present

## 2015-09-08 MED ORDER — FLUTICASONE PROPIONATE 50 MCG/ACT NA SUSP
NASAL | Status: DC
Start: 2015-09-08 — End: 2015-10-23

## 2015-09-08 NOTE — ED Provider Notes (Addendum)
CSN: [id number]     Arrival date & time 09/08/15  1919 History  By signing my name below, I, Darius Mitchell, attest that this documentation has been prepared under the direction and in the presence of Tanna Furry, MD. Electronically Signed: Altamease Mitchell, ED Scribe. 09/08/2015. 7:56 PM   Chief Complaint  Patient presents with  . Headache   The history is provided by the patient. No language interpreter was used.   Darius Mitchell is a 65 y.o. male with history of HTN, HLD, hypothyroidism, and anxiety who presents to the Emergency Department complaining of an intermittent, heavy, 5/10 in severity, headache with onset this evening. Pt states that he was driving when he started to feel flushed and like his head was going to "explode". The pain has improved but not resolved.  Associated symptoms include bilateral jaw/dental pain and facial pain. He has had some nasal congestion and used Afrin a couple hours prior tot he onset of symptoms. His blood pressure has been running low so he called his PCP who advised him to half his dose of valsartan-hydrochlorothiazide. Today his blood pressure was 177/100.  Pt denies chest pain and sinus pressure. No personal history of cardiac disease. He has had a normal stress test in the past. No known history of kidney disease.   Past Medical History  Diagnosis Date  . Hyperlipidemia   . Hypothyroidism   . Hypertension   . GERD (gastroesophageal reflux disease)   . Anxiety   . Appendicitis    Past Surgical History  Procedure Laterality Date  . Appendectomy  1971  . Wisdom tooth extraction  1972   Family History  Problem Relation Age of Onset  . Stroke Mother 10    Deceased  . Heart attack Mother   . Lymphoma Father 59    Deceased  . Heart disease Father   . Stroke Maternal Grandmother   . Stomach cancer Paternal Grandfather   . Healthy Brother     x2  . Healthy Sister   . Hypertension Daughter   . Healthy Daughter    Social History  Substance  Use Topics  . Smoking status: Former Smoker    Types: Cigarettes    Quit date: 06/28/1979  . Smokeless tobacco: Never Used  . Alcohol Use: None    Review of Systems  Constitutional: Negative for fever, chills, diaphoresis, appetite change and fatigue.  HENT: Positive for congestion. Negative for mouth sores, sinus pressure, sore throat and trouble swallowing.        Jaw and dental pain   Eyes: Negative for visual disturbance.  Respiratory: Negative for cough, chest tightness, shortness of breath and wheezing.   Cardiovascular: Negative for chest pain.  Gastrointestinal: Negative for nausea, vomiting, abdominal pain, diarrhea and abdominal distention.  Endocrine: Negative for polydipsia, polyphagia and polyuria.  Genitourinary: Negative for dysuria, frequency and hematuria.  Musculoskeletal: Negative for gait problem.  Skin: Negative for color change, pallor and rash.  Neurological: Positive for headaches. Negative for dizziness, syncope and light-headedness.  Hematological: Does not bruise/bleed easily.  Psychiatric/Behavioral: Negative for behavioral problems and confusion.    Allergies  Codeine and Niacin and related  Home Medications   Prior to Admission medications   Medication Sig Start Date End Date Taking? Authorizing Provider  atorvastatin (LIPITOR) 80 MG tablet Take 1 tablet (80 mg total) by mouth daily. 05/15/15   Brunetta Jeans, PA-C  azithromycin (ZITHROMAX) 250 MG tablet Take 2 tablets by mouth on day 1, followed by 1  tablet by mouth daily for 4 days. 06/10/15   Percell Miller Saguier, PA-C  benzonatate (TESSALON) 100 MG capsule Take 1 capsule (100 mg total) by mouth 3 (three) times daily as needed for cough. 06/10/15   Percell Miller Saguier, PA-C  fluticasone Asencion Islam) 50 MCG/ACT nasal spray 1 spray each nares twice a day, prn congestion 09/08/15   Tanna Furry, MD  Glucosamine-Chondroit-Vit C-Mn (GLUCOSAMINE 1500 COMPLEX PO) Take by mouth daily.    Historical Provider, MD   levothyroxine (SYNTHROID, LEVOTHROID) 112 MCG tablet TAKE 1 TABLET BY MOUTH DAILY BEFORE BREAKFAST 05/15/15   Brunetta Jeans, PA-C  Multiple Vitamins-Minerals (CENTRUM SILVER ULTRA MENS) TABS Take by mouth daily.    Historical Provider, MD  omeprazole (PRILOSEC) 40 MG capsule Take 1 capsule (40 mg total) by mouth daily. 05/15/15   Brunetta Jeans, PA-C  PARoxetine (PAXIL) 20 MG tablet Take 1 tablet (20 mg total) by mouth daily. 05/15/15   Brunetta Jeans, PA-C  sildenafil (VIAGRA) 100 MG tablet Take 1 tablet (100 mg total) by mouth daily as needed. 05/19/15   Brunetta Jeans, PA-C  testosterone (ANDROGEL) 50 MG/5GM (1%) GEL Place 5 g onto the skin daily as needed. TESTIM    Historical Provider, MD  valsartan-hydrochlorothiazide (DIOVAN HCT) 160-25 MG tablet Take 1 tablet by mouth daily. 05/15/15   Brunetta Jeans, PA-C   BP 148/98 mmHg  Pulse 70  Temp(Src) 97.8 F (36.6 C) (Oral)  Resp 18  Ht 5\' 11"  (1.803 m)  Wt 208 lb (94.348 kg)  BMI 29.02 kg/m2  SpO2 100% Physical Exam  Constitutional: He is oriented to person, place, and time. He appears well-developed and well-nourished. No distress.  HENT:  Head: Normocephalic.  Eyes: Conjunctivae are normal. Pupils are equal, round, and reactive to light. No scleral icterus.  Neck: Normal range of motion. Neck supple. No thyromegaly present.  Cardiovascular: Normal rate and regular rhythm.  Exam reveals no gallop and no friction rub.   No murmur heard. Pulmonary/Chest: Effort normal and breath sounds normal. No respiratory distress. He has no wheezes. He has no rales.  Abdominal: Soft. Bowel sounds are normal. He exhibits no distension. There is no tenderness. There is no rebound.  Musculoskeletal: Normal range of motion.  Neurological: He is alert and oriented to person, place, and time.  Skin: Skin is warm and dry. No rash noted.  Psychiatric: He has a normal mood and affect. His behavior is normal.    ED Course  Procedures  (including critical care time) DIAGNOSTIC STUDIES: Oxygen Saturation is 100% on RA,  normal by my interpretation.    COORDINATION OF CARE: 7:52 PM Discussed treatment plan which includes EKG, CT head with out contrast, and CT maxillofacial without contrast with pt at bedside and pt agreed to plan.  Labs Review Labs Reviewed - No data to display  Imaging Review Ct Head Wo Contrast  09/08/2015  CLINICAL DATA:  Head and face pressure for a few hours. Hypertension. EXAM: CT HEAD WITHOUT CONTRAST CT MAXILLOFACIAL WITHOUT CONTRAST TECHNIQUE: Multidetector CT imaging of the head and maxillofacial structures were performed using the standard protocol without intravenous contrast. Multiplanar CT image reconstructions of the maxillofacial structures were also generated. COMPARISON:  None. FINDINGS: CT HEAD FINDINGS Skull and Sinuses:Negative for fracture or destructive process. Visualized orbits: Negative. Brain: No evidence of acute infarction, hemorrhage, hydrocephalus, or mass lesion/mass effect. Intracranial calcified atherosclerosis. CT MAXILLOFACIAL FINDINGS No acute sinusitis or sinus obstruction. Mild scattered mucosal thickening, greatest in anterior ethmoid air cells.  There is a mucous retention cyst or less likely polyp in the left maxillary antrum which is nonobstructive. Clear mastoids where seen. Negative orbital contents. No soft tissue swelling. IMPRESSION: Negative.  No explanation for headache and face pain. Electronically Signed   By: Monte Fantasia M.D.   On: 09/08/2015 21:08   Ct Maxillofacial Wo Cm  09/08/2015  CLINICAL DATA:  Head and face pressure for a few hours. Hypertension. EXAM: CT HEAD WITHOUT CONTRAST CT MAXILLOFACIAL WITHOUT CONTRAST TECHNIQUE: Multidetector CT imaging of the head and maxillofacial structures were performed using the standard protocol without intravenous contrast. Multiplanar CT image reconstructions of the maxillofacial structures were also generated.  COMPARISON:  None. FINDINGS: CT HEAD FINDINGS Skull and Sinuses:Negative for fracture or destructive process. Visualized orbits: Negative. Brain: No evidence of acute infarction, hemorrhage, hydrocephalus, or mass lesion/mass effect. Intracranial calcified atherosclerosis. CT MAXILLOFACIAL FINDINGS No acute sinusitis or sinus obstruction. Mild scattered mucosal thickening, greatest in anterior ethmoid air cells. There is a mucous retention cyst or less likely polyp in the left maxillary antrum which is nonobstructive. Clear mastoids where seen. Negative orbital contents. No soft tissue swelling. IMPRESSION: Negative.  No explanation for headache and face pain. Electronically Signed   By: Monte Fantasia M.D.   On: 09/08/2015 21:08   I have personally reviewed and evaluated these images as part of my medical decision-making.   EKG Interpretation   Date/Time:  Tuesday September 08 2015 19:26:27 EDT Ventricular Rate:  66 PR Interval:  337 QRS Duration: 115 QT Interval:  472 QTC Calculation: 495 R Axis:   -44 Text Interpretation:  Sinus rhythm Prolonged PR interval Nonspecific IVCD  with LAD Confirmed by Jeneen Rinks  MD, [place] ([id number]) on 09/08/2015 9:15:38 PM      MDM   Final diagnoses:  Acute nonintractable headache, unspecified headache type   Headache and muscle facial films normal. No sinusitis. No concern authorizing intracerebral hemorrhage. They likely flushing sensation was due to her blood pressure spike after using the Afrin several times. We'll prescribe Flonase. Avoid afrin. Continue home regimen on blood pressure medication. ER with any worsening symptoms   I personally performed the services described in this documentation, which was scribed in my presence. The recorded information has been reviewed and is accurate.   Tanna Furry, MD 09/08/15 2125  Tanna Furry, MD 09/08/15 2126

## 2015-09-08 NOTE — ED Notes (Signed)
Patient states that he started to have a lot of pressure to his head as he was driving home at about 430. The patient reports that when he got home he took his BP and it was elevated.

## 2015-09-08 NOTE — Discharge Instructions (Signed)

## 2015-10-21 ENCOUNTER — Telehealth: Payer: Self-pay | Admitting: Physician Assistant

## 2015-10-21 NOTE — Telephone Encounter (Signed)
Pt called in to schedule an appt for a bp concern. Scheduled pt's appt. Pt declined speaking with a nurse.

## 2015-10-23 ENCOUNTER — Encounter: Payer: Self-pay | Admitting: Physician Assistant

## 2015-10-23 ENCOUNTER — Ambulatory Visit (INDEPENDENT_AMBULATORY_CARE_PROVIDER_SITE_OTHER): Payer: 59 | Admitting: Physician Assistant

## 2015-10-23 VITALS — BP 94/64 | HR 61 | Temp 98.2°F | Ht 71.0 in | Wt 212.2 lb

## 2015-10-23 DIAGNOSIS — G5601 Carpal tunnel syndrome, right upper limb: Secondary | ICD-10-CM | POA: Diagnosis not present

## 2015-10-23 DIAGNOSIS — I10 Essential (primary) hypertension: Secondary | ICD-10-CM | POA: Diagnosis not present

## 2015-10-23 HISTORY — DX: Carpal tunnel syndrome, right upper limb: G56.01

## 2015-10-23 MED ORDER — VALSARTAN 160 MG PO TABS: 160.0000 mg | ORAL_TABLET | Freq: Every day | ORAL | Status: DC

## 2015-10-23 MED FILL — VALSARTAN 160 MG TABLET: 160 | 30 days supply | Qty: 30 | Fill #0

## 2015-10-23 NOTE — Assessment & Plan Note (Signed)
Wrist brace given to patient. Wear nightly and during day when possible. OTC medications reviewed. Follow-up 3 weeks.

## 2015-10-23 NOTE — Progress Notes (Signed)
Patient presents to clinic today for follow-up of hypertension. Is currently on regimen of Diovan-HCT daily. Endorses taking daily as directed. Endorses lightheadedness on standing. Denies chest pain, palpitations, headaches.   BP Readings from Last 3 Encounters:  10/23/15 94/64  09/08/15 159/93  06/10/15 126/78   Denies change to diet or hydration. Denies change to exercise regimen.  Patient also endorses numbness and tingling of R thumb and forefinger with some wrist pain. Endorses a lot of typing and symptoms worsen during these times. Denies trauma or injury. .   Past Medical History  Diagnosis Date  . Hyperlipidemia   . Hypothyroidism   . Hypertension   . GERD (gastroesophageal reflux disease)   . Anxiety   . Appendicitis     Current Outpatient Prescriptions on File Prior to Visit  Medication Sig Dispense Refill  . atorvastatin (LIPITOR) 80 MG tablet Take 1 tablet (80 mg total) by mouth daily. 90 tablet 1  . levothyroxine (SYNTHROID, LEVOTHROID) 112 MCG tablet TAKE 1 TABLET BY MOUTH DAILY BEFORE BREAKFAST 90 tablet 1  . Multiple Vitamins-Minerals (CENTRUM SILVER ULTRA MENS) TABS Take by mouth daily.    Marland Kitchen omeprazole (PRILOSEC) 40 MG capsule Take 1 capsule (40 mg total) by mouth daily. 90 capsule 1  . PARoxetine (PAXIL) 20 MG tablet Take 1 tablet (20 mg total) by mouth daily. 90 tablet 1   No current facility-administered medications on file prior to visit.    Allergies  Allergen Reactions  . Codeine Nausea Only  . Niacin And Related     Family History  Problem Relation Age of Onset  . Stroke Mother 65    Deceased  . Heart attack Mother   . Lymphoma Father 24    Deceased  . Heart disease Father   . Stroke Maternal Grandmother   . Stomach cancer Paternal Grandfather   . Healthy Brother     x2  . Healthy Sister   . Hypertension Daughter   . Healthy Daughter     Social History   Social History  . Marital Status: Married    Spouse Name: N/A  . Number of  Children: N/A  . Years of Education: N/A   Social History Main Topics  . Smoking status: Former Smoker    Types: Cigarettes    Quit date: 06/28/1979  . Smokeless tobacco: Never Used  . Alcohol Use: None  . Drug Use: None  . Sexual Activity: Not Asked   Other Topics Concern  . None   Social History Narrative   Review of Systems - See HPI.  All other ROS are negative.  BP 94/64 mmHg  Pulse 61  Temp(Src) 98.2 F (36.8 C) (Oral)  Ht 5\' 11"  (1.803 m)  Wt 212 lb 4 oz (96.276 kg)  BMI 29.62 kg/m2  SpO2 96%  Physical Exam  Constitutional: He is oriented to person, place, and time and well-developed, well-nourished, and in no distress.  HENT:  Head: Normocephalic and atraumatic.  Eyes: Conjunctivae are normal.  Cardiovascular: Normal rate, regular rhythm, normal heart sounds and intact distal pulses.   Pulmonary/Chest: Effort normal and breath sounds normal. No respiratory distress. He has no wheezes. He has no rales. He exhibits no tenderness.  Musculoskeletal:       Right hand: He exhibits normal range of motion. Decreased sensation noted. Decreased sensation is present in the radial distribution. Normal strength noted.  + Tinel and Phalen signs  Neurological: He is alert and oriented to person, place, and time.  Skin: Skin is warm and dry. No rash noted.  Psychiatric: Affect normal.  Vitals reviewed.  Assessment/Plan: Essential hypertension, benign BP hypotensive. Lightheadedness with standing. Will decrease Diovan HCT to plain Diovan 160 mg. Encouraged increased hydration. Will follow-up in 3 weeks.  Carpal tunnel syndrome of right wrist Wrist brace given to patient. Wear nightly and during day when possible. OTC medications reviewed. Follow-up 3 weeks.

## 2015-10-23 NOTE — Progress Notes (Signed)
Pre visit review using our clinic review tool, if applicable. No additional management support is needed unless otherwise documented below in the visit note. 

## 2015-10-23 NOTE — Assessment & Plan Note (Signed)
BP hypotensive. Lightheadedness with standing. Will decrease Diovan HCT to plain Diovan 160 mg. Encouraged increased hydration. Will follow-up in 3 weeks.

## 2015-10-23 NOTE — Patient Instructions (Signed)
Please start the new blood pressure medication as directed. Stay well hydrated. Eat a well-balanced diet.  Follow-up with me in 3 weeks for reassessment.  Please wear your wrist brace at night and during the day when you can. Apply icy Hot or Aspercreme to the area.  Ibuprofen will help with swelling and hopefully cut down on the tingling.

## 2015-10-25 ENCOUNTER — Other Ambulatory Visit: Payer: Self-pay | Admitting: Physician Assistant

## 2015-11-07 ENCOUNTER — Other Ambulatory Visit: Payer: Self-pay | Admitting: Physician Assistant

## 2015-11-13 ENCOUNTER — Ambulatory Visit (INDEPENDENT_AMBULATORY_CARE_PROVIDER_SITE_OTHER): Payer: 59 | Admitting: Physician Assistant

## 2015-11-13 ENCOUNTER — Encounter: Payer: Self-pay | Admitting: Physician Assistant

## 2015-11-13 VITALS — BP 120/66 | HR 59 | Temp 98.1°F | Ht 71.0 in | Wt 211.6 lb

## 2015-11-13 DIAGNOSIS — I10 Essential (primary) hypertension: Secondary | ICD-10-CM | POA: Diagnosis not present

## 2015-11-13 MED ORDER — VALSARTAN 160 MG PO TABS: 160.0000 mg | ORAL_TABLET | Freq: Every day | ORAL | Status: DC

## 2015-11-13 NOTE — Progress Notes (Signed)
Patient presents to clinic today for follow-up of hypertension.  BP noted to be hypotensive at last visit. Patient was noting lightheadedness. Diovan-HCT was changed to plan Diovan. Is taking medication as directed. Lightheadedness has resolved with change in medication. Endorses feeling great overall since medication change. Patient denies chest pain, palpitations, dizziness, vision changes or frequent headaches.  BP Readings from Last 3 Encounters:  11/13/15 120/66  10/23/15 94/64  09/08/15 159/93    Past Medical History  Diagnosis Date  . Hyperlipidemia   . Hypothyroidism   . Hypertension   . GERD (gastroesophageal reflux disease)   . Anxiety   . Appendicitis     Current Outpatient Prescriptions on File Prior to Visit  Medication Sig Dispense Refill  . atorvastatin (LIPITOR) 80 MG tablet Take 1 tablet by mouth  daily 90 tablet 1  . levothyroxine (SYNTHROID, LEVOTHROID) 112 MCG tablet TAKE 1 TABLET BY MOUTH DAILY BEFORE BREAKFAST 90 tablet 1  . Multiple Vitamins-Minerals (CENTRUM SILVER ULTRA MENS) TABS Take by mouth daily.    Marland Kitchen omeprazole (PRILOSEC) 40 MG capsule Take 1 capsule by mouth  daily 90 capsule 0  . PARoxetine (PAXIL) 20 MG tablet Take 1 tablet (20 mg total) by mouth daily. 90 tablet 1  . valsartan (DIOVAN) 160 MG tablet Take 1 tablet (160 mg total) by mouth daily. 30 tablet 1   No current facility-administered medications on file prior to visit.    Allergies  Allergen Reactions  . Codeine Nausea Only  . Niacin And Related     Family History  Problem Relation Age of Onset  . Stroke Mother 47    Deceased  . Heart attack Mother   . Lymphoma Father 35    Deceased  . Heart disease Father   . Stroke Maternal Grandmother   . Stomach cancer Paternal Grandfather   . Healthy Brother     x2  . Healthy Sister   . Hypertension Daughter   . Healthy Daughter     Social History   Social History  . Marital Status: Married    Spouse Name: N/A  . Number of  Children: N/A  . Years of Education: N/A   Social History Main Topics  . Smoking status: Former Smoker    Types: Cigarettes    Quit date: 06/28/1979  . Smokeless tobacco: Never Used  . Alcohol Use: None  . Drug Use: None  . Sexual Activity: Not Asked   Other Topics Concern  . None   Social History Narrative   Review of Systems - See HPI.  All other ROS are negative.  BP 120/66 mmHg  Pulse 59  Temp(Src) 98.1 F (36.7 C) (Oral)  Ht 5\' 11"  (1.803 m)  Wt 211 lb 9.6 oz (95.981 kg)  BMI 29.53 kg/m2  SpO2 98%  Physical Exam  Constitutional: He is oriented to person, place, and time and well-developed, well-nourished, and in no distress.  HENT:  Head: Normocephalic and atraumatic.  Eyes: Conjunctivae are normal.  Cardiovascular: Normal rate, regular rhythm, normal heart sounds and intact distal pulses.   Pulmonary/Chest: Effort normal and breath sounds normal. No respiratory distress. He has no wheezes. He has no rales. He exhibits no tenderness.  Neurological: He is alert and oriented to person, place, and time.  Skin: Skin is warm and dry. No rash noted.  Psychiatric: Affect normal.  Vitals reviewed.  Assessment/Plan: Essential hypertension, benign BP improved with new medication regimen. Symptoms resolved. Continue current regimen. FU as scheduled for CPE.

## 2015-11-13 NOTE — Progress Notes (Signed)
Pre visit review using our clinic review tool, if applicable. No additional management support is needed unless otherwise documented below in the visit note. 

## 2015-11-13 NOTE — Assessment & Plan Note (Signed)
BP improved with new medication regimen. Symptoms resolved. Continue current regimen. FU as scheduled for CPE.

## 2015-11-13 NOTE — Patient Instructions (Signed)
Your blood pressure looks great today. Please continue the current regimen. I will see you when you come in for your physical.  Return sooner if you need anything.

## 2015-11-16 ENCOUNTER — Other Ambulatory Visit: Payer: Self-pay | Admitting: Physician Assistant

## 2015-11-19 ENCOUNTER — Telehealth: Payer: Self-pay

## 2015-11-20 ENCOUNTER — Other Ambulatory Visit: Payer: Self-pay | Admitting: Physician Assistant

## 2015-11-27 NOTE — Telephone Encounter (Signed)
Pre visit call completed 

## 2016-01-18 ENCOUNTER — Ambulatory Visit: Payer: 59 | Admitting: Medical

## 2016-01-19 ENCOUNTER — Ambulatory Visit (INDEPENDENT_AMBULATORY_CARE_PROVIDER_SITE_OTHER): Payer: 59 | Admitting: Physician Assistant

## 2016-01-19 ENCOUNTER — Encounter: Payer: Self-pay | Admitting: Physician Assistant

## 2016-01-19 VITALS — BP 138/82 | HR 68 | Temp 98.6°F | Resp 16 | Ht 71.0 in | Wt 210.0 lb

## 2016-01-19 DIAGNOSIS — T24201A Burn of second degree of unspecified site of right lower limb, except ankle and foot, initial encounter: Secondary | ICD-10-CM

## 2016-01-19 MED ORDER — DOXYCYCLINE HYCLATE 100 MG PO CAPS
100.0000 mg | ORAL_CAPSULE | Freq: Two times a day (BID) | ORAL | 0 refills | Status: DC
Start: 2016-01-19 — End: 2016-03-17

## 2016-01-19 MED ORDER — SILVER SULFADIAZINE 1 % EX CREA: 1.0000 "application " | TOPICAL_CREAM | Freq: Every day | CUTANEOUS | 0 refills | Status: DC

## 2016-01-19 MED FILL — SSD 1% CREAM: 1 | 30 days supply | Qty: 50 | Fill #0

## 2016-01-19 MED FILL — DOXYCYCLINE HYC 100 MG CAP: 100 | 10 days supply | Qty: 20 | Fill #0

## 2016-01-19 NOTE — Progress Notes (Signed)
Pre visit review using our clinic review tool, if applicable. No additional management support is needed unless otherwise documented below in the visit note/SLS  
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
## 2016-01-19 NOTE — Patient Instructions (Signed)
Please pick up medications. Apply the Silvadene cream daily as directed.  Take the antibiotic as directed. Clean wound with warm soapy water twice daily and dry completely.   Follow-up with me in 1 week. If anything worsens on medication, please call us immediately or go to the ER.   Second-Degree Burn A second-degree burn affects the 2 outer layers of skin. The outer layer (epidermis) and the layer underneath it (dermis) are both burned. Another name for this type of burn is a partial thickness burn. A second-degree burn may be called minor or major. This depends on the size of the burn. It also depends on what parts of the skin are burned. Minor burns may be treated with first aid. Major burns are a medical emergency. A second-degree burn is worse than a first-degree burn, but not as bad as a third-degree burn. A first-degree burn affects only the epidermis. A third-degree burn goes through all the layers of skin. A second-degree burn usually heals in 3 to 4 weeks. A minor second-degree burn usually does not leave a scar.Deeper second-degree burns may lead to scarring of the skin or contractures over joints.Contractures are scars that form over joints and may lead to reduced mobility at those joints. CAUSES  Heat (thermal) injury. This happens when skin comes in contact with something very hot. It could be a flame, a hot object, hot liquid, or steam. Most second-degree burns are thermal injuries.  Radiation. Sunlight is one type of radiation that can burn the skin. Another type of radiation is used to heat food. Radiation is also used to treat some diseases, such as cancer. All types of radiation can burn the skin. Sunlight usually causes a first-degree burn. Radiation used for heating food or treating a disease can cause a second-degree burn.  Electricity. Electrical burns can cause more damage under the skin than on the surface. They should always be treated as major burns.  Chemicals. Many  chemicals can burn the skin. The burn should be flushed with cool water and checked by an emergency caregiver. SYMPTOMS Symptoms of second-degree burns include:  Severe pain.  Extreme tenderness.  Deep redness.  Blistered skin.  Skin that has changed color.It might look blotchy, wet, or shiny.  Swelling. TREATMENT Some second-degree burns may need to be treated in a hospital. These include major burns, electrical burns, and chemical burns. Many other second-degree burns can be treated with regular first aid, such as:  Cooling the burn. Use cool, germ-free (sterile) salt water. Place the burned area of skin into a tub of water, or cover the burned area with clean, wet towels.  Taking pain medicine.  Removing the dead skin from broken blisters. A trained caregiver may do this. Do not pop blisters.  Gently washing your skin with mild soap.  Covering the burned area with a cream.Silver sulfadiazine is a cream for burns. An antibiotic cream, such as bacitracin, may also be used to fight infection. Do not use other ointments or creams unless your caregiver says it is okay.  Protecting the burn with a sterile, non-sticky bandage.  Bandaging fingers and toes separately. This keeps them from sticking together.  Taking an antibiotic. This can help prevent infection.  Getting a tetanus shot. HOME CARE INSTRUCTIONS Medication  Take any medicine prescribed by your caregiver. Follow the directions carefully.  Ask your caregiver if you can take over-the-counter medicine to relieve pain and swelling. Do not give aspirin to children.  Make sure your caregiver knows about  all other medicines you take.This includes over-the-counter medicines. Burn care  You will need to change the bandage on your burn. You may need to do this 2 or 3 times each day.  Gently clean the burned area.  Put ointment on it.  Cover the burn with a sterile bandage.  For some deeper burns or burns that  cover a large area, compression garments may be prescribed. These garments can help minimize scarring and protect your mobility.  Do not put butter or oil on your skin. Use only the cream prescribed by your caregiver.  Do not put ice on your burn.  Do not break blisters on your skin.  Keep the bandaged area dry. You might need to take a sponge bath for awhile.Ask your caregiver when you can take a shower or a tub bath again.  Do not scratch an itchy burn. Your caregiver may give you medicine to relieve very bad itching.  Infection is a big danger after a second-degree burn. Tell your caregiver right away if you have signs of infection, such as:  Redness or changing color in the burned area.  Fluid leaking from the burn.  Swelling in the burn area.  A bad smell coming from the wound. Follow-up  Keep all follow-up appointments.This is important. This is how your caregiver can tell if your treatment is working.  Protect your burn from sunlight.Use sunscreen whenever you go outside.Burned areas may be sensitive to the sun for up to 1 year. Exposure to the sun may also cause permanent darkening of scars. SEEK MEDICAL CARE IF:  You have any questions about medicines.  You have any questions about your treatment.  You wonder if it is okay to do a particular activity.  You develop a fever of more than 100.5 F (38.1 C). SEEK IMMEDIATE MEDICAL CARE IF:  You think your burn might be infected. It may change color, become red, leak fluid, swell, or smell bad.  You develop a fever of more than 102 F (38.9 C).   This information is not intended to replace advice given to you by your health care provider. Make sure you discuss any questions you have with your health care provider.   Document Released: 11/15/2010 Document Revised: 09/05/2011 Document Reviewed: 11/15/2010 Elsevier Interactive Patient Education 2016 [employer]    Cellulitis Cellulitis is an infection of the  skin and the tissue beneath it. The infected area is usually red and tender. Cellulitis occurs most often in the arms and lower legs.  CAUSES  Cellulitis is caused by bacteria that enter the skin through cracks or cuts in the skin. The most common types of bacteria that cause cellulitis are staphylococci and streptococci. SIGNS AND SYMPTOMS   Redness and warmth.  Swelling.  Tenderness or pain.  Fever. DIAGNOSIS  Your health care provider can usually determine what is wrong based on a physical exam. Blood tests may also be done. TREATMENT  Treatment usually involves taking an antibiotic medicine. HOME CARE INSTRUCTIONS   Take your antibiotic medicine as directed by your health care provider. Finish the antibiotic even if you start to feel better.  Keep the infected arm or leg elevated to reduce swelling.  Apply a warm cloth to the affected area up to 4 times per day to relieve pain.  Take medicines only as directed by your health care provider.  Keep all follow-up visits as directed by your health care provider. SEEK MEDICAL CARE IF:   You notice red streaks coming  from the infected area.  Your red area gets larger or turns dark in color.  Your bone or joint underneath the infected area becomes painful after the skin has healed.  Your infection returns in the same area or another area.  You notice a swollen bump in the infected area.  You develop new symptoms.  You have a fever. SEEK IMMEDIATE MEDICAL CARE IF:   You feel very sleepy.  You develop vomiting or diarrhea.  You have a general ill feeling (malaise) with muscle aches and pains.   This information is not intended to replace advice given to you by your health care provider. Make sure you discuss any questions you have with your health care provider.   Document Released: 03/23/2005 Document Revised: 03/04/2015 Document Reviewed: 08/29/2011 Elsevier Interactive Patient Education [employer].
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
## 2016-01-26 ENCOUNTER — Telehealth: Payer: Self-pay | Admitting: Physician Assistant

## 2016-01-26 ENCOUNTER — Ambulatory Visit: Payer: 59 | Admitting: Physician Assistant

## 2016-01-27 NOTE — Telephone Encounter (Signed)
Pt was no show 01/26/16 for f/u appt, pt has not rescheduled, 1st no show I see, charge or no charge?

## 2016-01-27 NOTE — Telephone Encounter (Signed)
No charge for 1st no-show 

## 2016-02-24 ENCOUNTER — Encounter: Payer: Self-pay | Admitting: Physician Assistant

## 2016-03-17 ENCOUNTER — Encounter: Payer: Self-pay | Admitting: Family Medicine

## 2016-03-17 ENCOUNTER — Ambulatory Visit (INDEPENDENT_AMBULATORY_CARE_PROVIDER_SITE_OTHER): Payer: 59 | Admitting: Family Medicine

## 2016-03-17 VITALS — BP 107/65 | HR 65 | Temp 97.8°F | Ht 71.0 in | Wt 205.0 lb

## 2016-03-17 DIAGNOSIS — R3129 Other microscopic hematuria: Secondary | ICD-10-CM

## 2016-03-17 DIAGNOSIS — K5289 Other specified noninfective gastroenteritis and colitis: Secondary | ICD-10-CM

## 2016-03-17 DIAGNOSIS — K5730 Diverticulosis of large intestine without perforation or abscess without bleeding: Secondary | ICD-10-CM | POA: Diagnosis not present

## 2016-03-17 HISTORY — DX: Diverticulosis of large intestine without perforation or abscess without bleeding: K57.30

## 2016-03-17 LAB — URINALYSIS, MICROSCOPIC ONLY

## 2016-03-17 NOTE — Addendum Note (Signed)
Addended by: Lamar Blinks C on: 03/17/2016 04:55 PM   Modules accepted: Orders

## 2016-03-17 NOTE — Patient Instructions (Signed)
I am glad that you are feeling better!  I think the cyst seen on your kidney on your recent CT scan is a benign finding, but we are glad to do an ultrasound for further detail if you like.   You did have diverticulosis seen on CT as well- this may never bother you, but does mean that you may have diverticulitis at some point in the future We will check your urine to make sure you do not have an abnormal amount of red blood cells  Slowly advance your diet and please let me know (or otherwise seek care) if you do not continue to improve

## 2016-03-17 NOTE — Progress Notes (Signed)
[place] at [facility] [address], [place], [place] [address] [phone]  Date:  03/17/2016   Name:  Darius Mitchell   DOB:  Dec 25, 1950   MRN:  [id number]  PCP:  Leeanne Rio, PA-C    Chief Complaint: Abdominal Pain (c/o abd pain, diarrhea, 6 severe episodes of vomiting on Monday after eating out while in [place] on Sunday. Pt was seen in the ER and did bring in records from visit.  Will get flu vaccine at work.)   History of Present Illness:  Darius Mitchell is a 65 y.o. very pleasant male patient who presents with the following:  Today is Thursday. He went to [place] for a few days for business last week.  He ate out on Sunday everning He had a burger, thought all was well. The next am - this past Monday- he awoke with severe belly pain, diarrhea and gas.  He also had vomiting x3.  He is not sure but thinks he had a fever  He went to [facility] but he was too sick to be seen there, went to the ER.   He had tests and IVF, a CT scan. Eventually it looked like food poisoning, no other pathology found. The next day he ate just a little, used meds for diarrhea and nausea.    He would like to go over his labs and results from the ER that he brings with him today.   Noted CT findings as below. Urine dip showed trace blood. Mild hypokalemia (3.3) and elevated BUN also He notes that his belly is still not 100%, he still feels gassy.  However he is overall getting better, no fever  1. There is a 3 cm cyst in the left kidney. 2. There are multiple mildly dilated fluid-filled loops of small bowel throughout the mid abdomen. This is a nonspecific bowel gas pattern. Gas and stool are seen throughout the colon. 3. Findings consistent with sigmoid diverticulosis with no evidence of diverticulitis.  Result Narrative  CT ABDOMEN PELVIS W CONTRAST  MIPS: All CT scans at this location are performed using dose reduction techniques including  automated exposure control, adjustment of the mA and/or kV according to patient size, and/or use of iterative reconstruction technique.  Clinical Information: Periumbilical abdominal pain  The imaged portions of both lung bases are normal. The liver and gallbladder are normal. The spleen and pancreas are normal. The stomach and duodenum are normal. Both adrenal glands are normal. The right kidney is normal. There is a 3 cm cyst in the left kidney. The left kidney is otherwise normal. There are multiple mildly dilated fluid-filled loops of small bowel throughout the abdomen. Gas and stool are seen within the colon. This is a nonspecific bowel gas pattern. There are multiple diverticuli seen in the sigmoid colon consistent with sigmoid diverticulosis. There is no evidence of diverticulitis. The bladder is normal. The bones and soft tissues of the pelvis are normal.   He is eating some again, he has felt a little constipated.  No further vomiting or diarrhea, no fever His most recent colonoscopy was in 2015.   He plans to get his flu shot at work this fall.    BP Readings from Last 3 Encounters:  03/17/16 107/65  01/19/16 138/82  11/13/15 120/66    Patient Active Problem List   Diagnosis Date Noted  . Carpal tunnel syndrome of right wrist 10/23/2015  . Low testosterone 05/15/2015  .  BMI 30.0-30.9,adult 05/15/2015  . Screening for ischemic heart disease 04/30/2014  . Visit for preventive health examination 04/30/2014  . Prostate cancer screening encounter, options and risks discussed 04/30/2014  . Essential hypertension, benign 03/25/2014  . Hyperlipidemia 03/25/2014  . GERD (gastroesophageal reflux disease) 03/25/2014  . Anxiety and depression 03/25/2014  . Hypothyroidism 03/25/2014    Past Medical History:  Diagnosis Date  . Anxiety   . Appendicitis   . GERD (gastroesophageal reflux disease)   . Hyperlipidemia   . Hypertension   . Hypothyroidism     Past Surgical  History:  Procedure Laterality Date  . APPENDECTOMY  1971  . [place] EXTRACTION  1972    Social History  Substance Use Topics  . Smoking status: Former Smoker    Types: Cigarettes    Quit date: 06/28/1979  . Smokeless tobacco: Never Used  . Alcohol use Not on file    Family History  Problem Relation Age of Onset  . Stroke Mother 67    Deceased  . Heart attack Mother   . Lymphoma Father 5    Deceased  . Heart disease Father   . Stroke Maternal Grandmother   . Stomach cancer Paternal Grandfather   . Healthy Brother     x2  . Healthy Sister   . Hypertension Daughter   . Healthy Daughter     Allergies  Allergen Reactions  . Codeine Nausea Only  . Niacin And Related     Medication list has been reviewed and updated.  Current Outpatient Prescriptions on File Prior to Visit  Medication Sig Dispense Refill  . atorvastatin (LIPITOR) 80 MG tablet Take 1 tablet by mouth  daily 90 tablet 1  . levothyroxine (SYNTHROID, LEVOTHROID) 112 MCG tablet Take 1 tablet by mouth  daily before breakfast 90 tablet 1  . Multiple Vitamins-Minerals (CENTRUM SILVER ULTRA MENS) TABS Take by mouth daily.    Marland Kitchen omeprazole (PRILOSEC) 40 MG capsule Take 1 capsule by mouth  daily 90 capsule 1  . PARoxetine (PAXIL) 20 MG tablet Take 1 tablet by mouth  daily 90 tablet 1  . silver sulfADIAZINE (SILVADENE) 1 % cream Apply 1 application topically daily. 50 g 0  . valsartan (DIOVAN) 160 MG tablet Take 1 tablet (160 mg total) by mouth daily. 90 tablet 1   No current facility-administered medications on file prior to visit.     Review of Systems:  As per HPI- otherwise negative.   Physical Examination: Vitals:   03/17/16 1008  BP: 107/65  Pulse: 65  Temp: 97.8 F (36.6 C)   Vitals:   03/17/16 1008  Weight: 205 lb (93 kg)  Height: 5\' 11"  (1.803 m)   Body mass index is 28.59 kg/m. Ideal Body Weight: Weight in (lb) to have BMI = 25: 178.9  GEN: WDWN, NAD, Non-toxic, A & O x 3, looks  well, overweight HEENT: Atraumatic, Normocephalic. Neck supple. No masses, No LAD. Ears and Nose: No external deformity. CV: RRR, No M/G/R. No JVD. No thrill. No extra heart sounds. PULM: CTA B, no wheezes, crackles, rhonchi. No retractions. No resp. distress. No accessory muscle use. ABD: S, NT, ND, +BS. No rebound. No HSM.  Active BS EXTR: No c/c/e NEURO Normal gait.  PSYCH: Normally interactive. Conversant. Not depressed or anxious appearing.  Calm demeanor.    Assessment and Plan: Other noninfectious gastroenteritis  Microhematuria - Plan: Urine Microscopic Only  Diverticulosis of colon without hemorrhage  Here today to follow-up from recent illness and ER  visit He was noted to have likely food borne or viral gastroenteritis.  He is getting better Will check a urine micro today to quantify any blood in the urine Discussed diverticulosis and renal cyst- offered [place] but he declines for now He would prefer to recheck his K and BUN when he has blood drawn for his routine visit in a couple of months- this is ok  See patient instructions for more details.     Signed Lamar Blinks, MD

## 2016-04-08 ENCOUNTER — Encounter: Payer: Self-pay | Admitting: Physician Assistant

## 2016-04-22 ENCOUNTER — Other Ambulatory Visit: Payer: Self-pay | Admitting: Physician Assistant

## 2016-04-25 NOTE — Telephone Encounter (Signed)
Rx request to pharmacy/SLS  

## 2016-05-13 ENCOUNTER — Other Ambulatory Visit: Payer: Self-pay | Admitting: Physician Assistant

## 2016-05-14 ENCOUNTER — Other Ambulatory Visit: Payer: Self-pay | Admitting: Physician Assistant

## 2016-05-16 ENCOUNTER — Encounter: Payer: Self-pay | Admitting: Family Medicine

## 2016-05-16 ENCOUNTER — Ambulatory Visit (INDEPENDENT_AMBULATORY_CARE_PROVIDER_SITE_OTHER): Payer: 59 | Admitting: Family Medicine

## 2016-05-16 ENCOUNTER — Encounter: Payer: 59 | Admitting: Physician Assistant

## 2016-05-16 VITALS — BP 128/62 | HR 60 | Temp 97.5°F | Ht 71.0 in | Wt 212.0 lb

## 2016-05-16 DIAGNOSIS — E039 Hypothyroidism, unspecified: Secondary | ICD-10-CM | POA: Diagnosis not present

## 2016-05-16 DIAGNOSIS — R143 Flatulence: Secondary | ICD-10-CM | POA: Diagnosis not present

## 2016-05-16 DIAGNOSIS — Z0000 Encounter for general adult medical examination without abnormal findings: Secondary | ICD-10-CM | POA: Diagnosis not present

## 2016-05-16 DIAGNOSIS — Z23 Encounter for immunization: Secondary | ICD-10-CM

## 2016-05-16 LAB — TSH: TSH: 1.53 u[IU]/mL (ref 0.35–4.50)

## 2016-05-16 MED ORDER — LEVOTHYROXINE SODIUM 112 MCG PO TABS: ORAL_TABLET | ORAL | 1 refills | Status: DC

## 2016-05-16 NOTE — Progress Notes (Signed)
Pre visit review using our clinic review tool, if applicable. No additional management support is needed unless otherwise documented below in the visit note. 

## 2016-05-16 NOTE — Progress Notes (Signed)
Chief Complaint  Patient presents with  . Annual Exam    [facility]roxine     Well Male Darius Mitchell is here for a complete physical.   His last physical was >1 year(s) ago.  Current diet: in general, a "healthy" diet   Current exercise: active outside Weight trend: stable Does pt snore? No. Daytime fatigue? No. Seat belt? Yes.   Concerns: Needs refills and has questions on vaccinations. He is also getting more flatulence as he ages. He tries to avoid gas producing foods. No pain, diarrhea, constipation, or blood in his stools.  Past Medical History:  Diagnosis Date  . Anxiety   . Appendicitis   . GERD (gastroesophageal reflux disease)   . Hyperlipidemia   . Hypertension   . Hypothyroidism     Past Surgical History:  Procedure Laterality Date  . APPENDECTOMY  1971  . WISDOM TOOTH EXTRACTION  1972   Medications  Current Outpatient Prescriptions on File Prior to Visit  Medication Sig Dispense Refill  . atorvastatin (LIPITOR) 80 MG tablet TAKE 1 TABLET BY MOUTH  DAILY 90 tablet 0  . levothyroxine (SYNTHROID, LEVOTHROID) 112 MCG tablet Take 1 tablet by mouth  daily before breakfast 90 tablet 1  . Multiple Vitamins-Minerals (CENTRUM SILVER ULTRA MENS) TABS Take by mouth daily.    Marland Kitchen omeprazole (PRILOSEC) 40 MG capsule Take 1 capsule by mouth  daily 90 capsule 1  . PARoxetine (PAXIL) 20 MG tablet Take 1 tablet by mouth  daily 90 tablet 1  . valsartan (DIOVAN) 160 MG tablet Take 1 tablet (160 mg total) by mouth daily. 90 tablet 1  . silver sulfADIAZINE (SILVADENE) 1 % cream Apply 1 application topically daily. (Patient not taking: Reported on 05/16/2016) 50 g 0   Allergies Allergies  Allergen Reactions  . Codeine Nausea Only  . Niacin And Related    Family History Family History  Problem Relation Age of Onset  . Stroke Mother 13    Deceased  . Heart attack Mother   . Lymphoma Father 54    Deceased  . Heart disease Father   . Stroke  Maternal Grandmother   . Stomach cancer Paternal Grandfather   . Healthy Brother     x2  . Healthy Sister   . Hypertension Daughter   . Healthy Daughter     Review of Systems: Constitutional:  no unexpected change in weight, no fevers or chills Eye:  no recent significant change in vision Ear/Nose/Mouth/Throat:  Ears:  no tinnitus or hearing loss Nose/Mouth/Throat:  no complaints of nasal congestion or bleeding, no sore throat and oral sores Cardiovascular:  no chest pain, no palpitations Respiratory:  no cough and no shortness of breath Gastrointestinal:  no abdominal pain, no change in bowel habits, no nausea, vomiting, diarrhea, or constipation and no black or bloody stool, +flatulence GU:  Male: negative for dysuria, frequency, and incontinence and negative for prostate symptoms Musculoskeletal/Extremities:  no pain, redness, or swelling of the joints Integumentary (Skin/Breast):  no abnormal skin lesions reported Neurologic:  no headaches, no numbness, tingling Endocrine:  weight changes, masses in the neck, heat/cold intolerance, bowel or skin changes, or cardiovascular system symptoms Hematologic/Lymphatic:  no abnormal bleeding, no HIV risk factors, no night sweats, no swollen nodes, no weight loss  Exam BP 128/62 (BP Location: Left Arm, Patient Position: Sitting, Cuff Size: Large)   Pulse 60   Temp 97.5 F (36.4 C) (Oral)   Ht 5\' 11"  (1.803 m)  Wt 212 lb (96.2 kg)   SpO2 97%   BMI 29.57 kg/m  General:  well developed, well nourished, in no apparent distress Skin:  no significant moles, warts, or growths Head:  no masses, lesions, or tenderness Eyes:  pupils equal and round, sclera anicteric without injection Ears:  canals without lesions, TMs shiny without retraction, no obvious effusion, no erythema Nose:  nares patent, septum midline, mucosa normal Throat/Pharynx:  lips and gingiva without lesion; tongue and uvula midline; non-inflamed pharynx; no exudates or  postnasal drainage Neck: neck supple without adenopathy, thyromegaly, or masses Lungs:  clear to auscultation, breath sounds equal bilaterally, no respiratory distress Cardio:  regular rate and rhythm without murmurs, heart sounds without clicks or rubs Abdomen:  abdomen soft, nontender; bowel sounds normal; no masses or organomegaly Genital (male): circumcised penis, no lesions or discharge; testes present bilaterally without masses or tenderness Rectal: Deferred Musculoskeletal:  symmetrical muscle groups noted without atrophy or deformity Extremities:  no clubbing, cyanosis, or edema, no deformities, no skin discoloration Neuro:  gait normal; deep tendon reflexes normal and symmetric Psych: well oriented with normal range of affect and appropriate judgment/insight  Assessment and Plan  Well adult exam  Hypothyroidism, unspecified type - Plan: levothyroxine (SYNTHROID, LEVOTHROID) 112 MCG tablet, TSH  Flatulence  Encounter for immunization - Plan: Flu vaccine HIGH DOSE PF   Well 65 y.o. male. Counseled on diet and exercise. Food/symptom diary for flatulence. Beano and simethicone as needed. Immunizations, labs, and further orders as above. Follow up in 6 mo to check HTN, HLD, and Thyroid. The patient voiced understanding and agreement to the plan.  [place], DO 05/16/16 9:25 AM

## 2016-05-16 NOTE — Patient Instructions (Signed)
Try to keep a food/symptom diary for your flatulence.

## 2016-06-24 ENCOUNTER — Other Ambulatory Visit: Payer: Self-pay

## 2016-06-24 ENCOUNTER — Encounter: Payer: Self-pay | Admitting: Family Medicine

## 2016-06-24 MED ORDER — LOSARTAN POTASSIUM 100 MG PO TABS: 100.0000 mg | ORAL_TABLET | Freq: Every day | ORAL | 3 refills | Status: DC

## 2016-07-02 ENCOUNTER — Other Ambulatory Visit: Payer: Self-pay | Admitting: Physician Assistant

## 2016-07-14 ENCOUNTER — Other Ambulatory Visit: Payer: Self-pay | Admitting: Physician Assistant

## 2016-08-31 ENCOUNTER — Encounter: Payer: Self-pay | Admitting: Family Medicine

## 2016-08-31 ENCOUNTER — Ambulatory Visit (INDEPENDENT_AMBULATORY_CARE_PROVIDER_SITE_OTHER): Payer: 59 | Admitting: Family Medicine

## 2016-08-31 VITALS — BP 130/80 | HR 67 | Temp 98.4°F | Resp 16 | Ht 71.0 in | Wt 213.6 lb

## 2016-08-31 DIAGNOSIS — B9789 Other viral agents as the cause of diseases classified elsewhere: Secondary | ICD-10-CM

## 2016-08-31 DIAGNOSIS — J069 Acute upper respiratory infection, unspecified: Secondary | ICD-10-CM | POA: Diagnosis not present

## 2016-08-31 MED ORDER — BENZONATATE 100 MG PO CAPS: 100.0000 mg | ORAL_CAPSULE | Freq: Three times a day (TID) | ORAL | 0 refills | Status: DC | PRN

## 2016-08-31 MED FILL — BENZONATATE 100 MG CAP: 100 | 10 days supply | Qty: 30 | Fill #0

## 2016-08-31 NOTE — Patient Instructions (Signed)
Continue to push fluids, practice good hand hygiene, and cover your mouth if you cough.  If you start having fevers, shaking or shortness of breath, seek immediate care.  

## 2016-08-31 NOTE — Progress Notes (Signed)
Pre visit review using our clinic review tool, if applicable. No additional management support is needed unless otherwise documented below in the visit note. 

## 2016-08-31 NOTE — Addendum Note (Signed)
Addended by: Ames Coupe on: 08/31/2016 08:31 AM   Modules accepted: Orders

## 2016-08-31 NOTE — Progress Notes (Signed)
Chief Complaint  Patient presents with  . Cough    started friday, taken mucinex, productive cough, heavy head, ears feels blocked    [name] here for URI complaints.  Duration: 5 days  Associated symptoms: sinus congestion, ear fullness, and cough. Denies: sinus pain, rhinorrhea, itchy watery eyes, ear pain, ear drainage, sore throat, shortness of breath, myalgia and fevers/shaking Treatment to date: Mucinex, Robitussin, Flonase (1 spray each nostril daily) Sick contacts: No  ROS:  Const: Denies fevers HEENT: As noted in HPI Lungs: No SOB  Past Medical History:  Diagnosis Date  . Anxiety   . Appendicitis   . GERD (gastroesophageal reflux disease)   . Hyperlipidemia   . Hypertension   . Hypothyroidism    Family History  Problem Relation Age of Onset  . Stroke Mother 67    Deceased  . Heart attack Mother   . Lymphoma Father 56    Deceased  . Heart disease Father   . Stroke Maternal Grandmother   . Stomach cancer Paternal Grandfather   . Healthy Brother     x2  . Healthy Sister   . Hypertension Daughter   . Healthy Daughter     BP 130/80 (BP Location: Left Arm, Cuff Size: Normal)   Pulse 67   Temp 98.4 F (36.9 C) (Oral)   Resp 16   Ht 5\' 11"  (1.803 m)   Wt 213 lb 9.6 oz (96.9 kg)   SpO2 98%   BMI 29.79 kg/m  General: Awake, alert, appears stated age HEENT: AT, [place], ears patent b/l and TM's neg, nares patent w/o discharge, pharynx pink and without exudates, MMM Neck: No masses or asymmetry Heart: RRR, no murmurs, no bruits Lungs: CTAB, no accessory muscle use Psych: Age appropriate judgment and insight, normal mood and affect  Viral URI with cough - Plan: benzonatate (TESSALON) 100 MG capsule  Orders as above. Increase Flonase to 2 sprays each nostril daily.  Continue to push fluids, practice good hand hygiene, cover mouth when coughing. F/u prn. If starting to experience fevers, shaking, or shortness of breath, seek immediate care. Pt voiced  understanding and agreement to the plan.  [place], DO 08/31/16 8:28 AM

## 2016-10-29 ENCOUNTER — Other Ambulatory Visit: Payer: Self-pay | Admitting: Family Medicine

## 2016-10-29 DIAGNOSIS — E039 Hypothyroidism, unspecified: Secondary | ICD-10-CM

## 2016-10-31 NOTE — Telephone Encounter (Signed)
Rx's sent to the pharmacy by e-script.//AB/CMA 

## 2016-11-14 ENCOUNTER — Ambulatory Visit: Payer: 59 | Admitting: Family Medicine

## 2016-11-23 ENCOUNTER — Ambulatory Visit: Payer: 59 | Admitting: Family Medicine

## 2016-11-30 ENCOUNTER — Ambulatory Visit: Payer: 59 | Admitting: Family Medicine

## 2016-12-16 ENCOUNTER — Other Ambulatory Visit: Payer: Self-pay | Admitting: Family Medicine

## 2016-12-19 NOTE — Telephone Encounter (Signed)
Rx approved and sent to the pharmacy by e-script.//AB/CMA 

## 2016-12-27 ENCOUNTER — Other Ambulatory Visit: Payer: Self-pay | Admitting: Family Medicine

## 2017-03-19 ENCOUNTER — Other Ambulatory Visit: Payer: Self-pay | Admitting: Family Medicine

## 2017-04-14 ENCOUNTER — Other Ambulatory Visit: Payer: Self-pay | Admitting: Family Medicine

## 2017-04-14 DIAGNOSIS — E039 Hypothyroidism, unspecified: Secondary | ICD-10-CM

## 2017-05-24 ENCOUNTER — Encounter: Payer: Self-pay | Admitting: Family Medicine

## 2017-05-24 ENCOUNTER — Ambulatory Visit (INDEPENDENT_AMBULATORY_CARE_PROVIDER_SITE_OTHER): Payer: 59 | Admitting: Family Medicine

## 2017-05-24 VITALS — BP 128/82 | HR 56 | Temp 97.6°F | Ht 71.0 in | Wt 216.1 lb

## 2017-05-24 DIAGNOSIS — Z0000 Encounter for general adult medical examination without abnormal findings: Secondary | ICD-10-CM | POA: Diagnosis not present

## 2017-05-24 DIAGNOSIS — Z136 Encounter for screening for cardiovascular disorders: Secondary | ICD-10-CM | POA: Diagnosis not present

## 2017-05-24 LAB — CBC
HEMATOCRIT: 40.5 % (ref 39.0–52.0)
HEMOGLOBIN: 13.9 g/dL (ref 13.0–17.0)
MCHC: 34.3 g/dL (ref 30.0–36.0)
MCV: 92.2 fl (ref 78.0–100.0)
Platelets: 158 10*3/uL (ref 150.0–400.0)
RBC: 4.39 Mil/uL (ref 4.22–5.81)
RDW: 13.4 % (ref 11.5–15.5)
WBC: 6.5 10*3/uL (ref 4.0–10.5)

## 2017-05-24 LAB — COMPREHENSIVE METABOLIC PANEL
ALK PHOS: 71 U/L (ref 39–117)
ALT: 23 U/L (ref 0–53)
AST: 23 U/L (ref 0–37)
Albumin: 4.4 g/dL (ref 3.5–5.2)
BUN: 17 mg/dL (ref 6–23)
CHLORIDE: 102 meq/L (ref 96–112)
CO2: 33 mEq/L — ABNORMAL HIGH (ref 19–32)
Calcium: 10 mg/dL (ref 8.4–10.5)
Creatinine, Ser: 1.09 mg/dL (ref 0.40–1.50)
GFR: 71.42 mL/min (ref >60.00)
GLUCOSE: 91 mg/dL (ref 70–99)
POTASSIUM: 4.5 meq/L (ref 3.5–5.1)
Sodium: 140 mEq/L (ref 135–145)
TOTAL PROTEIN: 7.4 g/dL (ref 6.0–8.3)
Total Bilirubin: 0.7 mg/dL (ref 0.2–1.2)

## 2017-05-24 LAB — LIPID PANEL
CHOLESTEROL: 183 mg/dL (ref 0–200)
HDL: 39.8 mg/dL (ref >39.00)
LDL CALC: 104 mg/dL — AB (ref 0–99)
NonHDL: 143.28
Total CHOL/HDL Ratio: 5
Triglycerides: 197 mg/dL — ABNORMAL HIGH (ref 0.0–149.0)
VLDL: 39.4 mg/dL (ref 0.0–40.0)

## 2017-05-24 NOTE — Progress Notes (Signed)
Chief Complaint  Patient presents with  . Annual Exam    Well Male Darius Mitchell is here for a complete physical.   His last physical was >1 year ago.  Current diet: in general, a "healthy" diet   Current exercise: golf, yardwork, walking Weight trend: stable Does pt snore? No. Daytime fatigue? No.  Seat belt? Yes.     Health maintenance Shingrix- No Colonoscopy- Yes Tetanus- Yes Hep C- Yes Prostate cancer screening- Yes Pneumonia vaccine- Yes AAA screening- No  Past Medical History:  Diagnosis Date  . Anxiety   . Appendicitis   . GERD (gastroesophageal reflux disease)   . Hyperlipidemia   . Hypertension   . Hypothyroidism     Past Surgical History:  Procedure Laterality Date  . APPENDECTOMY  1971  . WISDOM TOOTH EXTRACTION  1972   Medications  Current Outpatient Medications on File Prior to Visit  Medication Sig Dispense Refill  . atorvastatin (LIPITOR) 80 MG tablet TAKE 1 TABLET BY MOUTH  DAILY 90 tablet 0  . levothyroxine (SYNTHROID, LEVOTHROID) 112 MCG tablet TAKE 1 TABLET BY MOUTH  DAILY BEFORE BREAKFAST 90 tablet 0  . losartan (COZAAR) 100 MG tablet Take 1 tablet (100 mg total) by mouth daily. 90 tablet 3  . Multiple Vitamins-Minerals (CENTRUM SILVER ULTRA MENS) TABS Take by mouth daily.    Marland Kitchen omeprazole (PRILOSEC) 40 MG capsule TAKE 1 CAPSULE BY MOUTH  DAILY 90 capsule 1  . PARoxetine (PAXIL) 20 MG tablet TAKE 1 TABLET BY MOUTH  DAILY 90 tablet 0   Allergies Allergies  Allergen Reactions  . Codeine Nausea Only  . Niacin And Related    Family History Family History  Problem Relation Age of Onset  . Stroke Mother 65       Deceased  . Heart attack Mother   . Lymphoma Father 71       Deceased  . Heart disease Father   . Stroke Maternal Grandmother   . Stomach cancer Paternal Grandfather   . Healthy Brother        x2  . Healthy Sister   . Hypertension Daughter   . Healthy Daughter     Review of Systems: Constitutional:  no fevers or  chills Eye:  no recent significant change in vision Ear/Nose/Mouth/Throat:  Ears:  no tinnitus or hearing loss Nose/Mouth/Throat:  no complaints of nasal congestion or bleeding, no sore throat and oral sores Cardiovascular:  no chest pain, no palpitations Respiratory:  no cough and no shortness of breath Gastrointestinal:  no abdominal pain, no change in bowel habits, no nausea, vomiting, diarrhea, or constipation and no black or bloody stool GU:  Male: negative for dysuria, frequency, and incontinence and negative for prostate symptoms Musculoskeletal/Extremities:  no pain, redness, or swelling of the joints Integumentary (Skin):  no abnormal skin lesions reported Neurologic:  no headaches, no numbness, tingling Endocrine:  No unexpected weight changes Hematologic/Lymphatic:  no night sweats  Exam BP 128/82 (BP Location: Left Arm, Patient Position: Sitting, Cuff Size: Normal)   Pulse (!) 56   Temp 97.6 F (36.4 C) (Oral)   Ht 5\' 11"  (1.803 m)   Wt 216 lb 2 oz (98 kg)   SpO2 97%   BMI 30.14 kg/m  General:  well developed, well nourished, in no apparent distress Skin:  no significant moles, warts, or growths Head:  no masses, lesions, or tenderness Eyes:  pupils equal and round, sclera anicteric without injection Ears:  canals without lesions, TMs shiny without retraction,  no obvious effusion, no erythema Nose:  nares patent, septum midline, mucosa normal Throat/Pharynx:  lips and gingiva without lesion; tongue and uvula midline; non-inflamed pharynx; no exudates or postnasal drainage Neck: neck supple without adenopathy, thyromegaly, or masses Lungs:  clear to auscultation, breath sounds equal bilaterally, no respiratory distress Cardio:  regular rate and rhythm without murmurs, heart sounds without clicks or rubs Abdomen:  abdomen soft, nontender; bowel sounds normal; no masses or organomegaly Genital (male): Nml penis, no lesions or discharge; testes present bilaterally without  masses or tenderness Rectal: Deferred Musculoskeletal:  symmetrical muscle groups noted without atrophy or deformity Extremities:  no clubbing, cyanosis, or edema, no deformities, no skin discoloration Neuro:  gait normal; deep tendon reflexes normal and symmetric Psych: well oriented with normal range of affect and appropriate judgment/insight  Assessment and Plan  Well adult exam - Plan: CBC, Comprehensive metabolic panel, Lipid panel  Screening for AAA (abdominal aortic aneurysm) - Plan: [place] Screening AAA   Well 66 y.o. male. Counseled on diet and exercise. Other orders as above. Follow up in 6 mo.  The patient voiced understanding and agreement to the plan.  [place], DO 05/24/17 8:02 AM

## 2017-05-24 NOTE — Progress Notes (Signed)
Pre visit review using our clinic review tool, if applicable. No additional management support is needed unless otherwise documented below in the visit note. 

## 2017-05-24 NOTE — Patient Instructions (Addendum)
Keep up the good work.  Stay physically active.  Keep the diet clean.  If you do not hear anything about your ultrasound in the next 1-2 weeks, call our office and ask for an update.  Let us know if you would like a Shingles vaccine (Shingrix). It can make you feel crummy for the next 48 hours.

## 2017-06-01 ENCOUNTER — Encounter (HOSPITAL_BASED_OUTPATIENT_CLINIC_OR_DEPARTMENT_OTHER): Payer: Self-pay

## 2017-06-01 ENCOUNTER — Ambulatory Visit (HOSPITAL_BASED_OUTPATIENT_CLINIC_OR_DEPARTMENT_OTHER)
Admission: RE | Admit: 2017-06-01 | Discharge: 2017-06-01 | Disposition: A | Discharge status: Home / Self Care | Payer: 59 | Source: Ambulatory Visit | Attending: Family Medicine | Admitting: Family Medicine

## 2017-06-01 DIAGNOSIS — Z136 Encounter for screening for cardiovascular disorders: Secondary | ICD-10-CM | POA: Diagnosis present

## 2017-06-01 DIAGNOSIS — Z87891 Personal history of nicotine dependence: Secondary | ICD-10-CM | POA: Insufficient documentation

## 2017-06-02 ENCOUNTER — Other Ambulatory Visit: Payer: Self-pay | Admitting: Family Medicine

## 2017-06-08 ENCOUNTER — Other Ambulatory Visit: Payer: Self-pay | Admitting: Family Medicine

## 2017-06-13 ENCOUNTER — Telehealth: Payer: Self-pay | Admitting: Family Medicine

## 2017-06-13 ENCOUNTER — Ambulatory Visit: Payer: Self-pay | Admitting: *Deleted

## 2017-06-13 NOTE — Telephone Encounter (Signed)
Called pt to clarify that appointment is for Dr Nani Ravens is for 06/14/17; per Rod Holler there are no appointments available with any providers today; also checked to see if RN appointment available for BP check but no one was available; also advised that since pt is symptomatic he should be evaluated in ED; pt declines and to keep the appointment as scheduled; will route to [facility] pool.

## 2017-06-13 NOTE — Telephone Encounter (Signed)
Pt feels flushed and heavy headed; pt took aspirin this morning with some relief but had to take an additional ibuprofen at 1400; pt states that his SBP is usually 120-130; pt triaged per nurse protocol and accepts recommendation for Physician evaluation within 24 hours; pt offered and accepted appointment with Dr Nani Ravens 06/14/17 at 1515; pt verbalizes understanding; will notify [facility] pool for notification of this appointment.  Reason for Disposition . Systolic BP  >= 287 OR Diastolic >= 867  Answer Assessment - Initial Assessment Questions 1. BLOOD PRESSURE: "What is the blood pressure?" "Did you take at least two measurements 5 minutes apart?"     No only one reading done by work RN 2. ONSET: "When did you take your blood pressure?"     184/88 3. HOW: "How did you obtain the blood pressure?" (e.g., visiting nurse, automatic home BP monitor)     Work Therapist, sports used manual cuff on left arm 4. HISTORY: "Do you have a history of high blood pressure?"     yes 5. MEDICATIONS: "Are you taking any medications for blood pressure?" "Have you missed any doses recently?"     No; losartan 100 mg daily  6. OTHER SYMPTOMS: "Do you have any symptoms?" (e.g., headache, chest pain, blurred vision, difficulty breathing, weakness)     Feeling flushed 7. PREGNANCY: "Is there any chance you are pregnant?" "When was your last menstrual period?"     n/a  Protocols used: HIGH BLOOD PRESSURE-A-AH

## 2017-06-14 ENCOUNTER — Ambulatory Visit (INDEPENDENT_AMBULATORY_CARE_PROVIDER_SITE_OTHER): Payer: 59 | Admitting: Family Medicine

## 2017-06-14 ENCOUNTER — Encounter: Payer: Self-pay | Admitting: Family Medicine

## 2017-06-14 VITALS — BP 138/74 | HR 67 | Temp 98.1°F | Ht 71.0 in | Wt 216.2 lb

## 2017-06-14 DIAGNOSIS — F32A Depression, unspecified: Secondary | ICD-10-CM

## 2017-06-14 DIAGNOSIS — F419 Anxiety disorder, unspecified: Secondary | ICD-10-CM | POA: Diagnosis not present

## 2017-06-14 DIAGNOSIS — F329 Major depressive disorder, single episode, unspecified: Secondary | ICD-10-CM

## 2017-06-14 DIAGNOSIS — R030 Elevated blood-pressure reading, without diagnosis of hypertension: Secondary | ICD-10-CM | POA: Diagnosis not present

## 2017-06-14 MED ORDER — PAROXETINE HCL 30 MG PO TABS: 30.0000 mg | ORAL_TABLET | Freq: Every day | ORAL | 5 refills | Status: DC

## 2017-06-14 MED FILL — PARoxetine HCL 30 MG TABS: 30 | 30 days supply | Qty: 30 | Fill #0

## 2017-06-14 NOTE — Progress Notes (Signed)
Pre visit review using our clinic review tool, if applicable. No additional management support is needed unless otherwise documented below in the visit note. 

## 2017-06-14 NOTE — Patient Instructions (Addendum)
Continue checking BP's at home. If still elevated, let me know.   Your EKG is normal.   Let us know if you need anything.

## 2017-06-14 NOTE — Progress Notes (Signed)
Chief Complaint  Patient presents with  . Follow-up    blood pressure problems    Subjective Darius Mitchell is a 66 y.o. male who presents for hypertension follow up. He does monitor home blood pressures. Blood pressures ranging from 180-190's/90's over past couple days. He is compliant with medications.  He is on losartan 100 mg daily. Patient has these side effects of medication: none He is adhering to a healthy diet overall. Assoc s/s's include L neck fullness. He has been having more stress at work and at home.  A $1 million order was incorrectly placed and he is dealing with the ramifications. His daughter is getting divorced while another is getting fired. He is on Paxil 20 mg/d. No jaw pain, arm pain, sob, cp, palpitations, vision changes, nose bleeds.    Past Medical History:  Diagnosis Date  . Anxiety   . Appendicitis   . GERD (gastroesophageal reflux disease)   . Hyperlipidemia   . Hypertension   . Hypothyroidism    Family History  Problem Relation Age of Onset  . Stroke Mother 68       Deceased  . Heart attack Mother   . Lymphoma Father 61       Deceased  . Heart disease Father   . Stroke Maternal Grandmother   . Stomach cancer Paternal Grandfather   . Healthy Brother        x2  . Healthy Sister   . Hypertension Daughter   . Healthy Daughter     Medications Current Outpatient Medications on File Prior to Visit  Medication Sig Dispense Refill  . atorvastatin (LIPITOR) 80 MG tablet TAKE 1 TABLET BY MOUTH  DAILY 90 tablet 0  . levothyroxine (SYNTHROID, LEVOTHROID) 112 MCG tablet TAKE 1 TABLET BY MOUTH  DAILY BEFORE BREAKFAST 90 tablet 0  . losartan (COZAAR) 100 MG tablet Take 1 tablet (100 mg total) by mouth daily. 90 tablet 3  . Multiple Vitamins-Minerals (CENTRUM SILVER ULTRA MENS) TABS Take by mouth daily.    Marland Kitchen omeprazole (PRILOSEC) 40 MG capsule TAKE 1 CAPSULE BY MOUTH  DAILY 90 capsule 1  . PARoxetine (PAXIL) 20 MG tablet TAKE 1 TABLET BY MOUTH  DAILY 90  tablet 0   Allergies Allergies  Allergen Reactions  . Codeine Nausea Only  . Niacin And Related     Review of Systems Cardiovascular: no chest pain Respiratory:  no shortness of breath  Exam BP 138/74 (BP Location: Left Arm, Patient Position: Sitting, Cuff Size: Large)   Pulse 67   Temp 98.1 F (36.7 C) (Oral)   Ht 5\' 11"  (1.803 m)   Wt 216 lb 4 oz (98.1 kg)   SpO2 98%   BMI 30.16 kg/m  General:  well developed, well nourished, in no apparent distress Skin: warm, no pallor or diaphoresis Eyes: pupils equal and round, sclera anicteric without injection Heart: RRR, no bruits, no LE edema Lungs: clear to auscultation, no accessory muscle use Psych: well oriented with normal range of affect and appropriate judgment/insight  Elevated blood pressure reading - Plan: EKG 12-Lead  Anxiety and depression - Plan: PARoxetine (PAXIL) 30 MG tablet  Orders as above.  I felt it prudent to get an EKG given his cardiac history and left-sided neck pain.  It was close enough his jaw for me to to rule out acute coronary syndrome.  His EKG is unremarkable other than slight bradycardia. Given his increased level of anxiety, we increase his Paxil from 20 mg daily to 30  mg daily.  Hopefully this will have an effect on his blood pressure.  If it does not, he will let us know and I will add a medicine to his regimen. Counseled on signs and symptoms that would warrant seeking emergent medical care. F/u in 6 weeks. The patient voiced understanding and agreement to the plan.  [place], DO 06/14/17  4:58 PM

## 2017-06-28 ENCOUNTER — Encounter: Payer: Self-pay | Admitting: Family Medicine

## 2017-07-06 ENCOUNTER — Other Ambulatory Visit: Payer: Self-pay | Admitting: Family Medicine

## 2017-07-06 DIAGNOSIS — E039 Hypothyroidism, unspecified: Secondary | ICD-10-CM

## 2017-07-07 ENCOUNTER — Encounter: Payer: Self-pay | Admitting: Family Medicine

## 2017-07-07 ENCOUNTER — Ambulatory Visit: Payer: Self-pay

## 2017-07-07 NOTE — Telephone Encounter (Signed)
This encounter was created in error - please disregard.

## 2017-07-07 NOTE — Telephone Encounter (Signed)
  Pt calling with c/o HTN . Systolic 197/588 diastolic 32-549. Pt c/o burping more, flushed and stiff neck past 2 days. Pt has h/o HTN and take BP med. Pt states he has not missed any doses recently. Today's BP while triage call: 195/92 @ 1515 and 175/92 @ 1526. Pt denies chest pain, headache, weakness in the face, arm and leg. Pt states he has been having stiff neck but can touch his chin to his chest. Care advice given and disposition is to see physician within 24 hours. Pt given an appt 06/28/17 @ 1015 with Dimas Chyle MD. Reason for Disposition . Systolic BP  >= 826 OR Diastolic >= 415  Answer Assessment - Initial Assessment Questions 1. BLOOD PRESSURE: "What is the blood pressure?" "Did you take at least two measurements 5 minutes apart?"     195/92   175/92 2. ONSET: "When did you take your blood pressure?"    1515 and 1526 3. HOW: "How did you obtain the blood pressure?" (e.g., visiting nurse, automatic home BP monitor)     Automatic home BP monitor 4. HISTORY: "Do you have a history of high blood pressure?"     yes 5. MEDICATIONS: "Are you taking any medications for blood pressure?" "Have you missed any doses recently?"     Yes  Has not missed any doses 6. OTHER SYMPTOMS: "Do you have any symptoms?" (e.g., headache, chest pain, blurred vision, difficulty breathing, weakness)     Flushed and stiff neck 7. PREGNANCY: "Is there any chance you are pregnant?" "When was your last menstrual period?"     n/a  Protocols used: HIGH BLOOD PRESSURE-A-AH

## 2017-07-08 ENCOUNTER — Ambulatory Visit (INDEPENDENT_AMBULATORY_CARE_PROVIDER_SITE_OTHER): Payer: 59 | Admitting: Family Medicine

## 2017-07-08 ENCOUNTER — Encounter: Payer: Self-pay | Admitting: Family Medicine

## 2017-07-08 VITALS — BP 172/100 | HR 67 | Temp 97.8°F | Ht 71.0 in | Wt 218.2 lb

## 2017-07-08 DIAGNOSIS — I10 Essential (primary) hypertension: Secondary | ICD-10-CM

## 2017-07-08 MED ORDER — AMLODIPINE BESYLATE 10 MG PO TABS: 10.0000 mg | ORAL_TABLET | Freq: Every day | ORAL | 0 refills | Status: DC

## 2017-07-08 NOTE — Patient Instructions (Signed)
Start the amlodipine.  Please follow up with your primary within the next week to recheck your BP and discuss testing for your thyroid and sleep apnea.  Take care, Dr Jerline Pain

## 2017-07-08 NOTE — Progress Notes (Signed)
    Subjective:  Darius Mitchell is a 67 y.o. male who presents today for same-day appointment with a chief complaint of hypertension.   HPI:  Hypertension, established problem, worsening Several year history.  Current medications include losartan 100 mg daily.  He has been compliant with his medication without side effects.  All the past few days he has noticed his blood pressure being as high as the 200s over 100s.  No symptoms with this.  No chest Mitchell.  No shortness of breath.  No headache.  No vision changes.  He does not have any clear precipitating events.  He has been under increased stress at home work recently and recently increased his Paxil-but denies any other changes.  Occasionally catches himself snoring at night.  He has been on a stable dose of levothyroxine 112 mcg for several years.  ROS: Per HPI Objective:  Physical Exam: BP (!) 172/100 (BP Location: Left Arm, Patient Position: Sitting, Cuff Size: Normal)   Pulse 67   Temp 97.8 F (36.6 C) (Oral)   Ht 5\' 11"  (1.803 m)   Wt 218 lb 4 oz (99 kg)   SpO2 99%   BMI 30.44 kg/m   Gen: NAD, resting comfortably CV: RRR with no murmurs appreciated Pulm: NWOB, CTAB with no crackles, wheezes, or rhonchi Neuro: Cranial nerves II through XII intact. Moves all extremities Psych: Normal affect and thought content  Assessment/Plan:  Hypertension Currently asymptomatic.  Unclear reason for recent worsening.  We will start amlodipine 10 mg daily.  He does not need emergent treatment at this point.  Advised patient to continue home blood pressure monitoring with goal over the next few days of 150/90 or lower.  Advised him to follow-up with his primary within the next week for repeat blood pressure check and to discuss thyroid studies and possible sleep study to rule out OSA.  Strict return precautions reviewed.  Darius Mitchell. Darius Pain, MD 07/08/2017 10:22 AM

## 2017-07-12 ENCOUNTER — Encounter: Payer: Self-pay | Admitting: Family Medicine

## 2017-07-12 MED FILL — PARoxetine HCL 30 MG TABS: 30 | 30 days supply | Qty: 30 | Fill #1

## 2017-07-13 NOTE — Telephone Encounter (Signed)
Routine screening for thyroid abnormalities is not recommended, we can order tsh, schedule please.  Please refill amlodipine 10 mg, #90 w 1 refill and make sure [facility] doesn't have the old paroxetine dosing please. TY.

## 2017-08-01 ENCOUNTER — Other Ambulatory Visit: Payer: Self-pay | Admitting: Family Medicine

## 2017-08-01 MED ORDER — AMLODIPINE BESYLATE 10 MG PO TABS: 10.0000 mg | ORAL_TABLET | Freq: Every day | ORAL | 3 refills | Status: DC

## 2017-08-01 NOTE — Telephone Encounter (Signed)
Routing to PCP. Please advise. thanks

## 2017-08-21 ENCOUNTER — Other Ambulatory Visit: Payer: Self-pay | Admitting: Family Medicine

## 2017-08-22 ENCOUNTER — Telehealth: Payer: Self-pay | Admitting: Family Medicine

## 2017-08-22 ENCOUNTER — Encounter: Payer: Self-pay | Admitting: Family Medicine

## 2017-08-22 ENCOUNTER — Other Ambulatory Visit: Payer: Self-pay | Admitting: Family Medicine

## 2017-08-22 DIAGNOSIS — F419 Anxiety disorder, unspecified: Principal | ICD-10-CM

## 2017-08-22 DIAGNOSIS — F32A Depression, unspecified: Secondary | ICD-10-CM

## 2017-08-22 DIAGNOSIS — E039 Hypothyroidism, unspecified: Secondary | ICD-10-CM

## 2017-08-22 DIAGNOSIS — F329 Major depressive disorder, single episode, unspecified: Secondary | ICD-10-CM

## 2017-08-22 MED ORDER — PAROXETINE HCL 30 MG PO TABS: 30.0000 mg | ORAL_TABLET | Freq: Every day | ORAL | 1 refills | Status: DC

## 2017-08-22 NOTE — Telephone Encounter (Signed)
Copied from [place] [id number]. Topic: Quick Communication - See Telephone Encounter >> Aug 22, 2017 10:05 AM Aurelio Brash B wrote: CRM for notification. See Telephone encounter for:  Barnett Applebaum from [facility] called to say levothyroxine (SYNTHROID, LEVOTHROID) 112 MCG tablet not available  and is requesting to  replace the medication with generic form  Her contact number is [phone]    08/22/17.

## 2017-08-23 ENCOUNTER — Encounter: Payer: Self-pay | Admitting: Family Medicine

## 2017-08-23 ENCOUNTER — Other Ambulatory Visit: Payer: Self-pay | Admitting: Family Medicine

## 2017-08-23 DIAGNOSIS — E039 Hypothyroidism, unspecified: Secondary | ICD-10-CM

## 2017-08-23 MED ORDER — LEVOTHYROXINE SODIUM 112 MCG PO TABS: 112.0000 ug | ORAL_TABLET | Freq: Every day | ORAL | 1 refills | Status: DC

## 2017-08-23 NOTE — Telephone Encounter (Signed)
OK 

## 2017-08-23 NOTE — Telephone Encounter (Signed)
[facility] informed ok per PCP

## 2017-08-25 ENCOUNTER — Other Ambulatory Visit: Payer: Self-pay | Admitting: Family Medicine

## 2017-08-25 MED ORDER — AMLODIPINE BESYLATE 5 MG PO TABS
5.0000 mg | ORAL_TABLET | Freq: Every day | ORAL | 3 refills | Status: DC
Start: 2017-08-25 — End: 2018-06-18

## 2017-09-25 ENCOUNTER — Other Ambulatory Visit: Payer: Self-pay | Admitting: Family Medicine

## 2017-10-02 ENCOUNTER — Encounter: Payer: Self-pay | Admitting: Family Medicine

## 2017-11-02 ENCOUNTER — Encounter: Payer: Self-pay | Admitting: Family Medicine

## 2017-11-02 NOTE — Telephone Encounter (Signed)
Schedule appt. TY.  

## 2017-11-06 ENCOUNTER — Encounter: Payer: Self-pay | Admitting: Family Medicine

## 2017-11-06 ENCOUNTER — Ambulatory Visit (INDEPENDENT_AMBULATORY_CARE_PROVIDER_SITE_OTHER): Payer: 59 | Admitting: Family Medicine

## 2017-11-06 VITALS — BP 102/68 | HR 56 | Temp 98.0°F | Ht 71.0 in | Wt 213.2 lb

## 2017-11-06 DIAGNOSIS — R600 Localized edema: Secondary | ICD-10-CM | POA: Diagnosis not present

## 2017-11-06 DIAGNOSIS — E0590 Thyrotoxicosis, unspecified without thyrotoxic crisis or storm: Secondary | ICD-10-CM | POA: Diagnosis not present

## 2017-11-06 LAB — COMPREHENSIVE METABOLIC PANEL
ALT: 21 U/L (ref 0–53)
AST: 24 U/L (ref 0–37)
Albumin: 4.3 g/dL (ref 3.5–5.2)
Alkaline Phosphatase: 66 U/L (ref 39–117)
BILIRUBIN TOTAL: 0.9 mg/dL (ref 0.2–1.2)
BUN: 22 mg/dL (ref 6–23)
CO2: 30 meq/L (ref 19–32)
Calcium: 9.8 mg/dL (ref 8.4–10.5)
Chloride: 106 mEq/L (ref 96–112)
Creatinine, Ser: 1.22 mg/dL (ref 0.40–1.50)
GFR: 62.63 mL/min (ref >60.00)
GLUCOSE: 102 mg/dL — AB (ref 70–99)
Potassium: 5 mEq/L (ref 3.5–5.1)
SODIUM: 143 meq/L (ref 135–145)
Total Protein: 7.2 g/dL (ref 6.0–8.3)

## 2017-11-06 LAB — T4, FREE: Free T4: 0.84 ng/dL (ref 0.60–1.60)

## 2017-11-06 LAB — TSH: TSH: 3.88 u[IU]/mL (ref 0.35–4.50)

## 2017-11-06 NOTE — Progress Notes (Signed)
Chief Complaint  Patient presents with  . Leg Swelling    Darius Mitchell here for bilateral leg swelling.  Duration: 1 mo  Hx of prolonged bedrest, recent surgery, travel or injury? No Pain the calf? No SOB? No  Personal or family history of clot or bleeding disorder? No Hx of heart failure, renal failure, hepatic failure? No  Notices when he walks, swelling improves, when he sits it gets worse. Feels fine otherwise, no SOB or chest pain, no pain in calves.  He does have a hx of HTN and is currently on amlodipine 5 mg/d and losartan 100 mg/d. Reports compliance.   Hx of hypothyroidism, has not had labs checked in some time. No other issues and reports compliance with Synthroid.   ROS:  MSK- +leg swelling, no pain Lungs- no SOB  Past Medical History:  Diagnosis Date  . Anxiety   . Appendicitis   . GERD (gastroesophageal reflux disease)   . Hyperlipidemia   . Hypertension   . Hypothyroidism    Family History  Problem Relation Age of Onset  . Stroke Mother 55       Deceased  . Heart attack Mother   . Lymphoma Father 60       Deceased  . Heart disease Father   . Stroke Maternal Grandmother   . Stomach cancer Paternal Grandfather   . Healthy Brother        x2  . Healthy Sister   . Hypertension Daughter   . Healthy Daughter    Past Surgical History:  Procedure Laterality Date  . APPENDECTOMY  1971  . WISDOM TOOTH EXTRACTION  1972    Current Outpatient Medications:  .  amLODipine (NORVASC) 5 MG tablet, Take 1 tablet (5 mg total) by mouth daily., Disp: 90 tablet, Rfl: 3 .  atorvastatin (LIPITOR) 80 MG tablet, TAKE 1 TABLET BY MOUTH  DAILY, Disp: 90 tablet, Rfl: 0 .  levothyroxine (SYNTHROID, LEVOTHROID) 112 MCG tablet, Take 1 tablet (112 mcg total) by mouth daily before breakfast., Disp: 90 tablet, Rfl: 1 .  losartan (COZAAR) 100 MG tablet, TAKE 1 TABLET BY MOUTH  DAILY, Disp: 90 tablet, Rfl: 0 .  Multiple Vitamins-Minerals (CENTRUM SILVER ULTRA MENS) TABS, Take by  mouth daily., Disp: , Rfl:  .  omeprazole (PRILOSEC) 40 MG capsule, TAKE 1 CAPSULE BY MOUTH  DAILY, Disp: 90 capsule, Rfl: 1 .  PARoxetine (PAXIL) 30 MG tablet, Take 1 tablet (30 mg total) by mouth daily., Disp: 90 tablet, Rfl: 1  BP 102/68 (BP Location: Left Arm, Patient Position: Sitting, Cuff Size: Large)   Pulse (!) 56   Temp 98 F (36.7 C) (Oral)   Ht 5\' 11"  (1.803 m)   Wt 213 lb 4 oz (96.7 kg)   SpO2 98%   BMI 29.74 kg/m  Gen- awake, alert, appears stated age Heart- RRR, no gallops, No R sided edema noted, 1 +pitting edema around ankles and up to mid tibia. Lungs- CTAB, normal effort w/o accessory muscle use MSK- No calf pain Psych: Age appropriate judgment and insight  Edema, lower extremity - Plan: Comprehensive metabolic panel  Hyperthyroidism - Plan: TSH, T4, free  Orders as above. For now, will mind salt, encourage physical activity, elevate legs and write rx for compression stockings. If no improvement despite these changes and with normal labs after 4 weeks, will consider changing amlodipine to HCTZ vs chlorthalidone using today's labs as baseline and f/u in 1 week after starting to ck lytes and renal function. Echo  is consideration in future as well.  F/u prn. Pt voiced understanding and agreement to the plan.  [place], DO 11/06/17  8:44 AM

## 2017-11-06 NOTE — Progress Notes (Signed)
Pre visit review using our clinic review tool, if applicable. No additional management support is needed unless otherwise documented below in the visit note. 

## 2017-11-08 ENCOUNTER — Other Ambulatory Visit: Payer: Self-pay | Admitting: Family Medicine

## 2017-11-17 ENCOUNTER — Encounter: Payer: Self-pay | Admitting: Family Medicine

## 2017-11-21 ENCOUNTER — Other Ambulatory Visit: Payer: Self-pay | Admitting: Family Medicine

## 2017-11-21 DIAGNOSIS — M7989 Other specified soft tissue disorders: Secondary | ICD-10-CM

## 2017-11-29 ENCOUNTER — Ambulatory Visit (INDEPENDENT_AMBULATORY_CARE_PROVIDER_SITE_OTHER): Payer: 59 | Admitting: Cardiology

## 2017-11-29 ENCOUNTER — Encounter: Payer: Self-pay | Admitting: Cardiology

## 2017-11-29 VITALS — BP 118/66 | HR 60 | Ht 71.0 in | Wt 213.1 lb

## 2017-11-29 DIAGNOSIS — M7989 Other specified soft tissue disorders: Secondary | ICD-10-CM | POA: Diagnosis not present

## 2017-11-29 DIAGNOSIS — E782 Mixed hyperlipidemia: Secondary | ICD-10-CM

## 2017-11-29 DIAGNOSIS — I10 Essential (primary) hypertension: Secondary | ICD-10-CM | POA: Diagnosis not present

## 2017-11-29 HISTORY — DX: Other specified soft tissue disorders: M79.89

## 2017-11-29 NOTE — Patient Instructions (Signed)
Medication Instructions:  Your physician recommends that you continue on your current medications as directed. Please refer to the Current Medication list given to you today.   Labwork: None  Testing/Procedures: You had an EKG today.   Your physician has requested that you have an echocardiogram. Echocardiography is a painless test that uses sound waves to create images of your heart. It provides your doctor with information about the size and shape of your heart and how well your heart's chambers and valves are working. This procedure takes approximately one hour. There are no restrictions for this procedure.  Your physician has requested that you have a lower extremity venous duplex. This test is an ultrasound of the veins in the legs. It looks at venous blood flow that carries blood from the heart to the legs. Allow one hour for a Lower Venous exam. There are no restrictions or special instructions.  Follow-Up: Your physician recommends that you schedule a follow-up appointment in: 1 month.  If you need a refill on your cardiac medications before your next appointment, please call your pharmacy.   Thank you for choosing [facility]! Robyne Peers, RN [phone]

## 2017-11-29 NOTE — Progress Notes (Signed)
Cardiology Consultation:    Date:  11/29/2017   ID:  Darius Mitchell, DOB 07-18-1950, MRN [id number]  PCP:  [name], DO  Cardiologist:  Jenne Campus, MD   Referring MD: [name]*   Chief Complaint  Patient presents with  . Edema    Left leg, started 5 weeks ago, no pain, becomes twice the size as the opposite ankle  Have swollen legs especially left one  History of Present Illness:    Darius Mitchell is a 67 y.o. male who is being seen today for the evaluation of swelling legs at the request of [name]*.  About 5 weeks ago he noted swelling of lower extremities left leg is more swollen than right.  There is no pain no calf tenderness.  There is no shortness of breath no pleuritic chest pain.  He otherwise is doing well.  He considers himself is a healthy person he said he played golf but uses a cart.  He does not exercise on the regular basis.  He does have hypertension which is well managed.  His cholesterol LDL is 104 HDL 39.  He still smokes some cigarettes rarely while he played golf. Never had any heart trouble.  No chest pain no tightness no squeezing or pressure no burning in the chest no proximal nocturnal dyspnea.  Past Medical History:  Diagnosis Date  . Anxiety   . Appendicitis   . GERD (gastroesophageal reflux disease)   . Hyperlipidemia   . Hypertension   . Hypothyroidism     Past Surgical History:  Procedure Laterality Date  . APPENDECTOMY  1971  . WISDOM TOOTH EXTRACTION  1972    Current Medications: Current Meds  Medication Sig  . amLODipine (NORVASC) 5 MG tablet Take 1 tablet (5 mg total) by mouth daily.  Marland Kitchen atorvastatin (LIPITOR) 80 MG tablet TAKE 1 TABLET BY MOUTH  DAILY  . levothyroxine (SYNTHROID, LEVOTHROID) 112 MCG tablet Take 1 tablet (112 mcg total) by mouth daily before breakfast.  . losartan (COZAAR) 100 MG tablet TAKE 1 TABLET BY MOUTH  DAILY  . Multiple Vitamins-Minerals (CENTRUM SILVER ULTRA MENS)  TABS Take by mouth daily.  Marland Kitchen omeprazole (PRILOSEC) 40 MG capsule TAKE 1 CAPSULE BY MOUTH  DAILY  . PARoxetine (PAXIL) 30 MG tablet Take 1 tablet (30 mg total) by mouth daily.     Allergies:   Codeine and Niacin and related   Social History   Socioeconomic History  . Marital status: Married    Spouse name: Not on file  . Number of children: Not on file  . Years of education: Not on file  . Highest education level: Not on file  Occupational History  . Not on file  Social Needs  . Financial resource strain: Not on file  . Food insecurity:    Worry: Not on file    Inability: Not on file  . Transportation needs:    Medical: Not on file    Non-medical: Not on file  Tobacco Use  . Smoking status: Current Some Day Smoker    Packs/day: 0.25    Years: 25.00    Pack years: 6.25    Types: Cigarettes    Last attempt to quit: 06/28/1979    Years since quitting: 38.4  . Smokeless tobacco: Never Used  . Tobacco comment: Only when playing golf  Substance and Sexual Activity  . Alcohol use: Never    Frequency: Never  . Drug use: Never  . Sexual activity:  Not on file  Lifestyle  . Physical activity:    Days per week: Not on file    Minutes per session: Not on file  . Stress: Not on file  Relationships  . Social connections:    Talks on phone: Not on file    Gets together: Not on file    Attends religious service: Not on file    Active member of club or organization: Not on file    Attends meetings of clubs or organizations: Not on file    Relationship status: Not on file  Other Topics Concern  . Not on file  Social History Narrative  . Not on file     Family History: The patient's family history includes Healthy in his brother, daughter, and sister; Heart attack in his mother; Heart disease in his father; Hypertension in his daughter; Lymphoma (age of onset: 53) in his father; Stomach cancer in his paternal grandfather; Stroke in his maternal grandmother; Stroke (age of onset:  24) in his mother. ROS:   Please see the history of present illness.    All 14 point review of systems negative except as described per history of present illness.  EKGs/Labs/Other Studies Reviewed:    The following studies were reviewed today:   EKG:  EKG is  ordered today.  The ekg ordered today demonstrates normal sinus rhythm normal P interval incomplete right bundle branch block no ST segment changes  Recent Labs: 05/24/2017: Hemoglobin 13.9; Platelets 158.0 11/06/2017: ALT 21; BUN 22; Creatinine, Ser 1.22; Potassium 5.0; Sodium 143; TSH 3.88  Recent Lipid Panel    Component Value Date/Time   CHOL 183 05/24/2017 0803   TRIG 197.0 (H) 05/24/2017 0803   HDL 39.80 05/24/2017 0803   CHOLHDL 5 05/24/2017 0803   VLDL 39.4 05/24/2017 0803   LDLCALC 104 (H) 05/24/2017 0803    Physical Exam:    VS:  BP 118/66 (BP Location: Right Arm)   Pulse 60   Ht 5\' 11"  (1.803 m)   Wt 213 lb 1.9 oz (96.7 kg)   SpO2 98%   BMI 29.72 kg/m     Wt Readings from Last 3 Encounters:  11/29/17 213 lb 1.9 oz (96.7 kg)  11/06/17 213 lb 4 oz (96.7 kg)  07/08/17 218 lb 4 oz (99 kg)     GEN:  Well nourished, well developed in no acute distress HEENT: Normal NECK: No JVD; No carotid bruits LYMPHATICS: No lymphadenopathy CARDIAC: RRR, no murmurs, no rubs, no gallops RESPIRATORY:  Clear to auscultation without rales, wheezing or rhonchi  ABDOMEN: Soft, non-tender, non-distended MUSCULOSKELETAL:  No edema; No deformity  SKIN: Warm and dry NEUROLOGIC:  Alert and oriented x 3 PSYCHIATRIC:  Normal affect   ASSESSMENT:    1. Essential hypertension, benign   2. Swelling of both lower extremities   3. Mixed hyperlipidemia    PLAN:    In order of problems listed above:  1. Swelling of lower extremities with left being more swollen than right likely he does not have any pain in the calf I still think we need to rule out possibility of DVT.  He will have ultrasounds of his leg.  Other potential  explanation for swelling of lower extremities is congestive heart failure.  I will ask him to have echocardiogram to assess left ventricular ejection fraction as well as to check pulmonary artery pressure.  Other explanation is a calcium channel blocker Norvasc that he takes which could be responsible for swelling. 2. Essential hypertension:  Appears to be well controlled we will continue present management. 3. Mild dyslipidemia with LDL only 104.  For now conservative approach. 4. Snoring: According to his wife he snores a lot and start sometimes stop breathing at night.  In the future we will talk about sleep study.  Echocardiogram will be done to assess pulmonary artery pressure.  See him back in office in about 1 month or sooner if he get a problem   Medication Adjustments/Labs and Tests Ordered: Current medicines are reviewed at length with the patient today.  Concerns regarding medicines are outlined above.  No orders of the defined types were placed in this encounter.  No orders of the defined types were placed in this encounter.   Signed, Park Liter, MD, [facility]. 11/29/2017 9:24 AM    [place]

## 2017-12-06 ENCOUNTER — Encounter: Payer: Self-pay | Admitting: Cardiology

## 2017-12-08 ENCOUNTER — Ambulatory Visit (HOSPITAL_BASED_OUTPATIENT_CLINIC_OR_DEPARTMENT_OTHER)
Admission: RE | Admit: 2017-12-08 | Discharge: 2017-12-08 | Disposition: A | Discharge status: Home / Self Care | Payer: 59 | Source: Ambulatory Visit | Attending: Cardiology | Admitting: Cardiology

## 2017-12-08 ENCOUNTER — Encounter (HOSPITAL_BASED_OUTPATIENT_CLINIC_OR_DEPARTMENT_OTHER): Payer: 59

## 2017-12-08 DIAGNOSIS — E785 Hyperlipidemia, unspecified: Secondary | ICD-10-CM | POA: Insufficient documentation

## 2017-12-08 DIAGNOSIS — I10 Essential (primary) hypertension: Secondary | ICD-10-CM | POA: Diagnosis not present

## 2017-12-08 DIAGNOSIS — I119 Hypertensive heart disease without heart failure: Secondary | ICD-10-CM | POA: Insufficient documentation

## 2017-12-08 DIAGNOSIS — I77810 Thoracic aortic ectasia: Secondary | ICD-10-CM | POA: Diagnosis not present

## 2017-12-08 DIAGNOSIS — M7989 Other specified soft tissue disorders: Secondary | ICD-10-CM

## 2017-12-08 DIAGNOSIS — I351 Nonrheumatic aortic (valve) insufficiency: Secondary | ICD-10-CM | POA: Insufficient documentation

## 2017-12-08 NOTE — Progress Notes (Signed)
  Echocardiogram 2D Echocardiogram has been performed.  Joelene Millin 12/08/2017, 3:47 PM

## 2017-12-08 NOTE — Progress Notes (Addendum)
   Complete Lower extremity venous duplex has been performed. No evidence of DVT  Meriel Kelliher, Alyson Locket 12/08/2017, 3:46 PM

## 2017-12-21 ENCOUNTER — Other Ambulatory Visit (HOSPITAL_BASED_OUTPATIENT_CLINIC_OR_DEPARTMENT_OTHER): Payer: 59

## 2017-12-21 ENCOUNTER — Encounter (HOSPITAL_BASED_OUTPATIENT_CLINIC_OR_DEPARTMENT_OTHER): Payer: 59

## 2018-01-08 ENCOUNTER — Encounter: Payer: Self-pay | Admitting: Cardiology

## 2018-01-08 ENCOUNTER — Ambulatory Visit (INDEPENDENT_AMBULATORY_CARE_PROVIDER_SITE_OTHER): Payer: 59 | Admitting: Cardiology

## 2018-01-08 VITALS — BP 104/68 | HR 82 | Ht 71.0 in | Wt 214.0 lb

## 2018-01-08 DIAGNOSIS — M7989 Other specified soft tissue disorders: Secondary | ICD-10-CM | POA: Diagnosis not present

## 2018-01-08 DIAGNOSIS — I10 Essential (primary) hypertension: Secondary | ICD-10-CM | POA: Diagnosis not present

## 2018-01-08 DIAGNOSIS — E782 Mixed hyperlipidemia: Secondary | ICD-10-CM

## 2018-01-08 NOTE — Progress Notes (Signed)
Cardiology Office Note:    Date:  01/08/2018   ID:  Darius Mitchell, DOB [birth year], MRN [id number]  PCP:  [name], DO  Cardiologist:  Jenne Campus, MD    Referring MD: [name]*   Chief Complaint  Patient presents with  . 1 month follow up  Doing better  History of Present Illness:    Darius Mitchell is a 67 y.o. male who originally presented to me with chief complaint of swelling of lower extremities.  He did have quite extensive evaluation trying to answer the question why he had swelling DVT study of lower extremities revealed no evidence of DVT, echocardiogram showed preserved left ventricular ejection fraction.  He does have some relaxation abnormality none of this is worrisome enough to create any problem.  He in the meantime cut down some salty drinks that he was drinking.  And his swelling is dramatically better.  He still think 5 mg of amlodipine which I advised him to continue overall he is feeling good he exercised on the regular basis he walks he played golf he is trying to stay healthy and seems to be doing very well from that point review.  Another concern was about possibility of sleep apnea.  He does have normal pulmonary artery pressure.  I approach him today with idea of having sleep study done however he declined he said he does not want to do it.   Past Medical History:  Diagnosis Date  . Anxiety   . Appendicitis   . GERD (gastroesophageal reflux disease)   . Hyperlipidemia   . Hypertension   . Hypothyroidism     Past Surgical History:  Procedure Laterality Date  . APPENDECTOMY  1971  . WISDOM TOOTH EXTRACTION  1972    Current Medications: Current Meds  Medication Sig  . amLODipine (NORVASC) 5 MG tablet Take 1 tablet (5 mg total) by mouth daily.  Marland Kitchen atorvastatin (LIPITOR) 80 MG tablet TAKE 1 TABLET BY MOUTH  DAILY  . levothyroxine (SYNTHROID, LEVOTHROID) 112 MCG tablet Take 1 tablet (112 mcg total) by mouth daily before  breakfast.  . losartan (COZAAR) 100 MG tablet TAKE 1 TABLET BY MOUTH  DAILY  . Multiple Vitamins-Minerals (CENTRUM SILVER ULTRA MENS) TABS Take by mouth daily.  Marland Kitchen omeprazole (PRILOSEC) 40 MG capsule TAKE 1 CAPSULE BY MOUTH  DAILY  . PARoxetine (PAXIL) 30 MG tablet Take 1 tablet (30 mg total) by mouth daily.     Allergies:   Codeine and Niacin and related   Social History   Socioeconomic History  . Marital status: Married    Spouse name: Not on file  . Number of children: Not on file  . Years of education: Not on file  . Highest education level: Not on file  Occupational History  . Not on file  Social Needs  . Financial resource strain: Not on file  . Food insecurity:    Worry: Not on file    Inability: Not on file  . Transportation needs:    Medical: Not on file    Non-medical: Not on file  Tobacco Use  . Smoking status: Current Some Day Smoker    Packs/day: 0.25    Years: 25.00    Pack years: 6.25    Types: Cigarettes    Last attempt to quit: 06/28/1979    Years since quitting: 38.5  . Smokeless tobacco: Never Used  . Tobacco comment: Only when playing golf  Substance and Sexual Activity  . Alcohol use:  Never    Frequency: Never  . Drug use: Never  . Sexual activity: Not on file  Lifestyle  . Physical activity:    Days per week: Not on file    Minutes per session: Not on file  . Stress: Not on file  Relationships  . Social connections:    Talks on phone: Not on file    Gets together: Not on file    Attends religious service: Not on file    Active member of club or organization: Not on file    Attends meetings of clubs or organizations: Not on file    Relationship status: Not on file  Other Topics Concern  . Not on file  Social History Narrative  . Not on file     Family History: The patient's family history includes Healthy in his brother, daughter, and sister; Heart attack in his mother; Heart disease in his father; Hypertension in his daughter; Lymphoma  (age of onset: 53) in his father; Stomach cancer in his paternal grandfather; Stroke in his maternal grandmother; Stroke (age of onset: 10) in his mother. ROS:   Please see the history of present illness.    All 14 point review of systems negative except as described per history of present illness  EKGs/Labs/Other Studies Reviewed:      Recent Labs: 05/24/2017: Hemoglobin 13.9; Platelets 158.0 11/06/2017: ALT 21; BUN 22; Creatinine, Ser 1.22; Potassium 5.0; Sodium 143; TSH 3.88  Recent Lipid Panel    Component Value Date/Time   CHOL 183 05/24/2017 0803   TRIG 197.0 (H) 05/24/2017 0803   HDL 39.80 05/24/2017 0803   CHOLHDL 5 05/24/2017 0803   VLDL 39.4 05/24/2017 0803   LDLCALC 104 (H) 05/24/2017 0803    Physical Exam:    VS:  BP 104/68   Pulse 82   Ht 5\' 11"  (1.803 m)   Wt 214 lb (97.1 kg)   SpO2 98%   BMI 29.85 kg/m     Wt Readings from Last 3 Encounters:  01/08/18 214 lb (97.1 kg)  11/29/17 213 lb 1.9 oz (96.7 kg)  11/06/17 213 lb 4 oz (96.7 kg)     GEN:  Well nourished, well developed in no acute distress HEENT: Normal NECK: No JVD; No carotid bruits LYMPHATICS: No lymphadenopathy CARDIAC: RRR, no murmurs, no rubs, no gallops RESPIRATORY:  Clear to auscultation without rales, wheezing or rhonchi  ABDOMEN: Soft, non-tender, non-distended MUSCULOSKELETAL:  No edema; No deformity  SKIN: Warm and dry LOWER EXTREMITIES: no swelling NEUROLOGIC:  Alert and oriented x 3 PSYCHIATRIC:  Normal affect   ASSESSMENT:    1. Swelling of both lower extremities   2. Mixed hyperlipidemia   3. Essential hypertension, benign    PLAN:    In order of problems listed above:  1. Swelling of both lower extremities.  Improved.  Now he does not have any problem.  He changed drinks that he drink used to drink drinks with the episodes.  He changed that.  Swelling is gone.  Echocardiogram normal ejection fraction DVT study negative.  I told him to let me know if swelling will recur.   At that time we may be forced to put extra dose of water pill or cut down his amlodipine. 2. Mixed dyslipidemia followed by internal medicine team. 3. Essential hypertension.  Blood pressure well controlled.  See him back in my office in 4 5 months or sooner if he get a problem   Medication Adjustments/Labs and Tests Ordered: Current medicines  are reviewed at length with the patient today.  Concerns regarding medicines are outlined above.  No orders of the defined types were placed in this encounter.  Medication changes: No orders of the defined types were placed in this encounter.   Signed, Park Liter, MD, [facility] 01/08/2018 11:12 AM    [place]

## 2018-01-08 NOTE — Patient Instructions (Signed)
Medication Instructions:  Your physician recommends that you continue on your current medications as directed. Please refer to the Current Medication list given to you today.  Labwork: None ordered  Testing/Procedures: None ordered  Follow-Up: Your physician recommends that you schedule a follow-up appointment in: 4 months with Dr. Agustin Cree   Any Other Special Instructions Will Be Listed Below (If Applicable).     If you need a refill on your cardiac medications before your next appointment, please call your pharmacy.

## 2018-01-10 ENCOUNTER — Other Ambulatory Visit: Payer: Self-pay | Admitting: Family Medicine

## 2018-01-10 DIAGNOSIS — F32A Depression, unspecified: Secondary | ICD-10-CM

## 2018-01-10 DIAGNOSIS — F329 Major depressive disorder, single episode, unspecified: Secondary | ICD-10-CM

## 2018-01-10 DIAGNOSIS — F419 Anxiety disorder, unspecified: Principal | ICD-10-CM

## 2018-03-12 ENCOUNTER — Other Ambulatory Visit: Payer: Self-pay | Admitting: Family Medicine

## 2018-03-12 DIAGNOSIS — E039 Hypothyroidism, unspecified: Secondary | ICD-10-CM

## 2018-03-28 ENCOUNTER — Other Ambulatory Visit: Payer: Self-pay | Admitting: Family Medicine

## 2018-04-09 ENCOUNTER — Other Ambulatory Visit: Payer: Self-pay | Admitting: Family Medicine

## 2018-04-09 ENCOUNTER — Telehealth: Payer: Self-pay | Admitting: Family Medicine

## 2018-04-09 MED ORDER — LOSARTAN POTASSIUM 100 MG PO TABS: 100.0000 mg | ORAL_TABLET | Freq: Every day | ORAL | 0 refills | Status: DC

## 2018-04-09 MED FILL — LOSARTAN POTASSIUM 100 MG T: 100 | 30 days supply | Qty: 30 | Fill #0

## 2018-04-09 NOTE — Telephone Encounter (Signed)
Copied from [place] [id number]. Topic: Quick Communication - See Telephone Encounter >> Apr 09, 2018 10:06 AM Antonieta Iba C wrote: CRM for notification. See Telephone encounter for: 04/09/18.   Pt is requesting a 30 days supply sent in to his local pharmacy: [facility] says that his mail order doesn't currently have medication losartan (COZAAR) 100 MG tablet in stock at the moment. Pt says that he was advised that they will have back in stock next month. Pt will be leaving to go out of town

## 2018-05-10 MED FILL — LOSARTAN POTASSIUM 100 MG T: 100 | 30 days supply | Qty: 30 | Fill #1

## 2018-05-14 ENCOUNTER — Other Ambulatory Visit: Payer: Self-pay | Admitting: Family Medicine

## 2018-05-14 DIAGNOSIS — F329 Major depressive disorder, single episode, unspecified: Secondary | ICD-10-CM

## 2018-05-14 DIAGNOSIS — F32A Depression, unspecified: Secondary | ICD-10-CM

## 2018-05-14 DIAGNOSIS — F419 Anxiety disorder, unspecified: Principal | ICD-10-CM

## 2018-06-14 ENCOUNTER — Other Ambulatory Visit: Payer: Self-pay | Admitting: Family Medicine

## 2018-06-18 ENCOUNTER — Encounter: Payer: Self-pay | Admitting: Family Medicine

## 2018-06-18 ENCOUNTER — Ambulatory Visit (INDEPENDENT_AMBULATORY_CARE_PROVIDER_SITE_OTHER): Payer: 59 | Admitting: Family Medicine

## 2018-06-18 VITALS — BP 120/78 | HR 79 | Temp 98.6°F | Ht 71.0 in | Wt 212.0 lb

## 2018-06-18 DIAGNOSIS — Z0000 Encounter for general adult medical examination without abnormal findings: Secondary | ICD-10-CM | POA: Diagnosis not present

## 2018-06-18 DIAGNOSIS — N5089 Other specified disorders of the male genital organs: Secondary | ICD-10-CM | POA: Diagnosis not present

## 2018-06-18 DIAGNOSIS — D489 Neoplasm of uncertain behavior, unspecified: Secondary | ICD-10-CM | POA: Diagnosis not present

## 2018-06-18 NOTE — Progress Notes (Signed)
Pre visit review using our clinic review tool, if applicable. No additional management support is needed unless otherwise documented below in the visit note. 

## 2018-06-18 NOTE — Patient Instructions (Addendum)
Keep the diet clean and stay active.  Give [place] 2-3 business days to get the results of your labs back.   Do not shower for the rest of the day. When you do wash it, use only soap and water. Do not vigorously scrub. Apply triple antibiotic ointment (like Neosporin) twice daily. Keep the area clean and dry.   Things to look out for: increasing pain not relieved by ibuprofen/acetaminophen, fevers, spreading redness, drainage of pus, or foul odor.  Give [place] 1 business week to get the results of your biopsy back.   Someone will reach out to you within the next week or so (maybe longer with holidays) regarding your ultrasound.   Let us know if you need anything.

## 2018-06-18 NOTE — Progress Notes (Signed)
Chief Complaint  Patient presents with  . Annual Exam    leg swelling    Well Male Darius Mitchell is here for a complete physical.   His last physical was >1 year ago.  Current diet: in general, a "pretty good" diet.   Current exercise: active at work, golfing, yard work, walking Weight trend: stable Daytime fatigue? No. Seat belt? Yes.     Health maintenance Colonoscopy- No Tetanus- No Hep C- Yes Pneumonia vaccine- Yes   Skin lesion has been present on toe for greater than 1 year.  It was torn off in the past but came back.  Past Medical History:  Diagnosis Date  . Anxiety   . Appendicitis   . GERD (gastroesophageal reflux disease)   . Hyperlipidemia   . Hypertension   . Hypothyroidism      Past Surgical History:  Procedure Laterality Date  . APPENDECTOMY  1971  . WISDOM TOOTH EXTRACTION  1972    Medications  Current Outpatient Medications on File Prior to Visit  Medication Sig Dispense Refill  . amLODipine (NORVASC) 5 MG tablet TAKE 1 TABLET BY MOUTH  DAILY 90 tablet 3  . atorvastatin (LIPITOR) 80 MG tablet TAKE 1 TABLET BY MOUTH  DAILY 90 tablet 1  . levothyroxine (SYNTHROID, LEVOTHROID) 112 MCG tablet TAKE 1 TABLET BY MOUTH  DAILY BEFORE BREAKFAST 90 tablet 1  . losartan (COZAAR) 100 MG tablet Take 1 tablet (100 mg total) by mouth daily. 90 tablet 0  . Multiple Vitamins-Minerals (CENTRUM SILVER ULTRA MENS) TABS Take by mouth daily.    Marland Kitchen omeprazole (PRILOSEC) 40 MG capsule TAKE 1 CAPSULE BY MOUTH  DAILY 90 capsule 1  . PARoxetine (PAXIL) 30 MG tablet TAKE 1 TABLET BY MOUTH  DAILY 90 tablet 0  . aspirin EC 81 MG tablet Take 1 tablet (81 mg total) by mouth daily.     Allergies Allergies  Allergen Reactions  . Codeine Nausea Only  . Niacin And Related     Family History Family History  Problem Relation Age of Onset  . Stroke Mother 10       Deceased  . Heart attack Mother   . Lymphoma Father 70       Deceased  . Heart disease Father   . Stroke  Maternal Grandmother   . Stomach cancer Paternal Grandfather   . Healthy Brother        x2  . Healthy Sister   . Hypertension Daughter   . Healthy Daughter     Review of Systems: Constitutional:  no fevers or chills Eye:  no recent significant change in vision Ear/Nose/Mouth/Throat:  Ears:  no recent hearing loss Nose/Mouth/Throat:  no complaints of nasal congestion or sore throat Cardiovascular:  no chest pain, no palpitations Respiratory:  no cough and no shortness of breath Gastrointestinal:  no abdominal pain, no change in bowel habits GU:  Male: negative for dysuria, frequency, and incontinence and negative for prostate symptoms Musculoskeletal/Extremities:  no pain, redness, or swelling of the joints Integumentary (Skin): +lesion on L 2nd toe; otherwise no abnormal skin lesions reported Neurologic:  no headaches, Endocrine:  No unexpected weight changes Hematologic/Lymphatic:  no areas of easy bruising  Exam BP 120/78 (BP Location: Left Arm, Patient Position: Sitting, Cuff Size: Normal)   Pulse 79   Temp 98.6 F (37 C) (Oral)   Ht 5\' 11"  (1.803 m)   Wt 212 lb (96.2 kg)   SpO2 97%   BMI 29.57 kg/m  General:  well developed, well nourished, in no apparent distress Skin: see below; otherwise no significant moles, warts, or growths Head:  no masses, lesions, or tenderness Eyes:  pupils equal and round, sclera anicteric without injection Ears:  canals without lesions, TMs shiny without retraction, no obvious effusion, no erythema Nose:  nares patent, septum midline, mucosa normal Throat/Pharynx:  lips and gingiva without lesion; tongue and uvula midline; non-inflamed pharynx; no exudates or postnasal drainage Neck: neck supple without adenopathy, thyromegaly, or masses Lungs:  clear to auscultation, breath sounds equal bilaterally, no respiratory distress Cardio:  regular rate and rhythm, no LE edema or bruits Abdomen:  abdomen soft, nontender; bowel sounds normal; no  masses or organomegaly Genital (male): circumcised penis, no lesions or discharge; testes present bilaterally with cystic mass on R, no other masses or tenderness Rectal: Deferred Musculoskeletal:  symmetrical muscle groups noted without atrophy or deformity Extremities:  no clubbing, cyanosis, or edema, no deformities, no skin discoloration Neuro:  gait normal; deep tendon reflexes normal and symmetric Psych: well oriented with normal range of affect and appropriate judgment/insight    Procedure note; shave biopsy Informed consent was obtained. The area was cleaned with alcohol and injected with 0.5 mL of 1% lidocaine without epinephrine. A Dermablade was slightly bent and used to cut under the area of interest. The specimen was placed in a sterile specimen cup and sent to the lab. The area was then cauterized ensuring adequate hemostasis. The area was dressed with triple antibiotic ointment and a bandage. There were no complications noted. The patient tolerated the procedure well.   Assessment and Plan  Well adult exam - Plan: TSH  Testicle lump - Plan: US Scrotum  Neoplasm of uncertain behavior - Plan: Dermatology pathology(Darius Mitchell), PR SHAV SKIN LES <0.5 CM REMAINDER BODY   Well 67 y.o. male. Counseled on diet and exercise. Other orders as above.  Aftercare instructions provided verbally and in writing. Hopefully get back in 1 week.  Follow up in 6 mo.  The patient voiced understanding and agreement to the plan.  [place], DO 06/18/18 2:40 PM

## 2018-06-19 LAB — TSH: TSH: 2.72 u[IU]/mL (ref 0.35–4.50)

## 2018-06-22 ENCOUNTER — Ambulatory Visit (HOSPITAL_BASED_OUTPATIENT_CLINIC_OR_DEPARTMENT_OTHER)
Admission: RE | Admit: 2018-06-22 | Discharge: 2018-06-22 | Disposition: A | Discharge status: Home / Self Care | Payer: 59 | Source: Ambulatory Visit | Attending: Family Medicine | Admitting: Family Medicine

## 2018-06-22 DIAGNOSIS — D2930 Benign neoplasm of unspecified epididymis: Secondary | ICD-10-CM | POA: Insufficient documentation

## 2018-06-22 DIAGNOSIS — N5089 Other specified disorders of the male genital organs: Secondary | ICD-10-CM | POA: Diagnosis present

## 2018-07-04 ENCOUNTER — Telehealth: Payer: Self-pay | Admitting: *Deleted

## 2018-07-04 NOTE — Telephone Encounter (Signed)
Received Dermatopathology Report results from [facility]; forwarded to provider/SLS 01/08

## 2018-07-10 ENCOUNTER — Other Ambulatory Visit: Payer: Self-pay | Admitting: Family Medicine

## 2018-08-29 ENCOUNTER — Other Ambulatory Visit: Payer: Self-pay | Admitting: Family Medicine

## 2018-08-29 DIAGNOSIS — E039 Hypothyroidism, unspecified: Secondary | ICD-10-CM

## 2018-09-04 ENCOUNTER — Other Ambulatory Visit: Payer: Self-pay | Admitting: Family Medicine

## 2018-09-04 DIAGNOSIS — F329 Major depressive disorder, single episode, unspecified: Secondary | ICD-10-CM

## 2018-09-04 DIAGNOSIS — F419 Anxiety disorder, unspecified: Principal | ICD-10-CM

## 2018-09-04 DIAGNOSIS — F32A Depression, unspecified: Secondary | ICD-10-CM

## 2018-09-14 ENCOUNTER — Other Ambulatory Visit: Payer: Self-pay | Admitting: Family Medicine

## 2018-09-25 ENCOUNTER — Encounter: Payer: Self-pay | Admitting: Family Medicine

## 2018-09-26 ENCOUNTER — Other Ambulatory Visit: Payer: Self-pay | Admitting: Family Medicine

## 2018-09-26 DIAGNOSIS — M21619 Bunion of unspecified foot: Secondary | ICD-10-CM

## 2018-09-26 DIAGNOSIS — M79673 Pain in unspecified foot: Secondary | ICD-10-CM

## 2018-09-26 IMAGING — US US AORTA SCREENING (MEDICARE)
1 series · 14 of 22 positions shown · non-contrast
Comparison: CT 06/08/2016

CLINICAL DATA: Hypertension, previous tobacco abuse

EXAM:
US ABDOMINAL AORTA MEDICARE SCREENING
TECHNIQUE: Ultrasound examination of the abdominal aorta was performed as a
screening evaluation for abdominal aortic aneurysm.

[Series 1: us aorta screening (medicare) · 0.29mm/px · 14 of 22 slices shown]
[im 1/22]
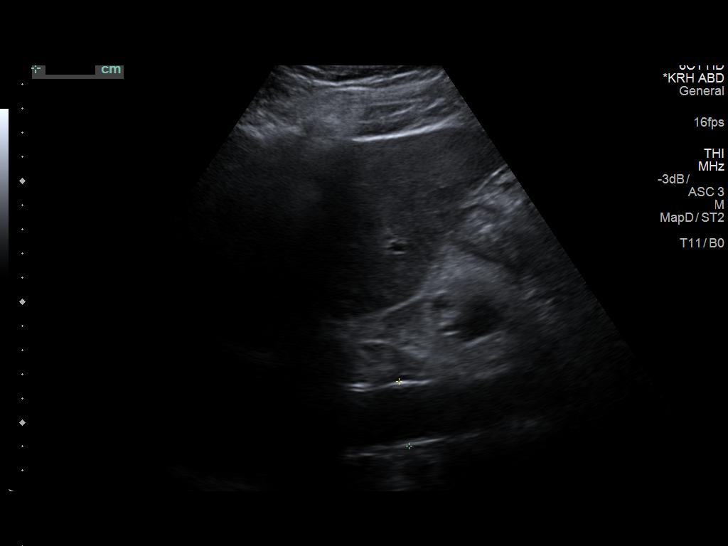
[im 3/22]
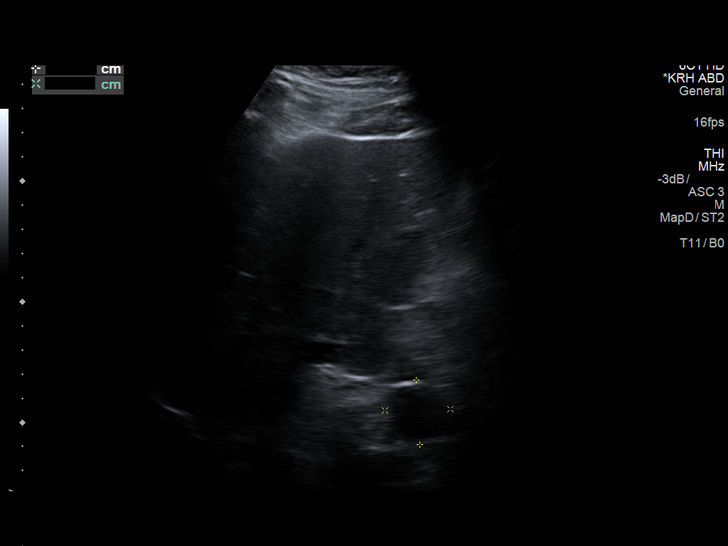
[im 4/22]
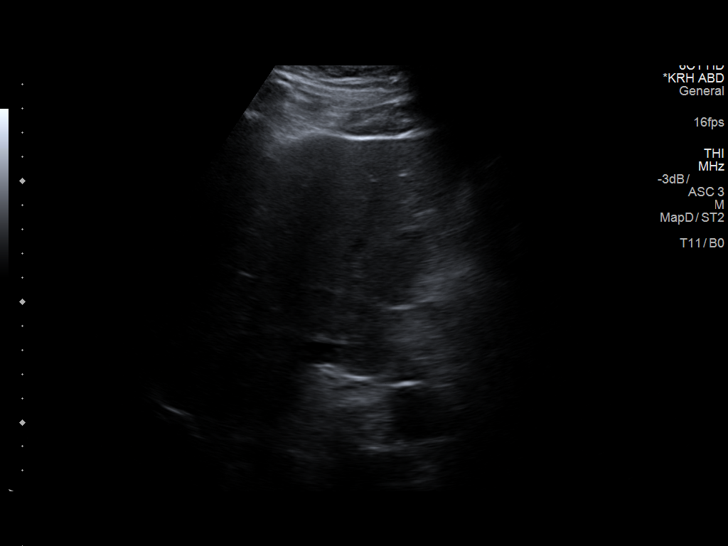
[im 6/22]
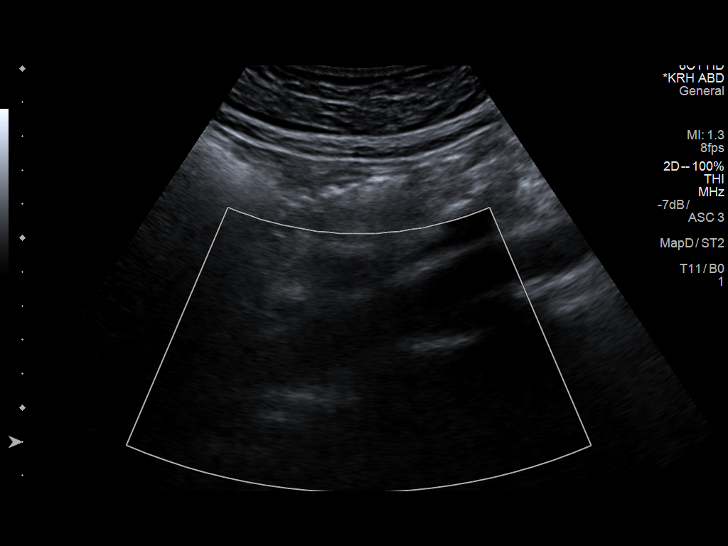
[im 8/22]
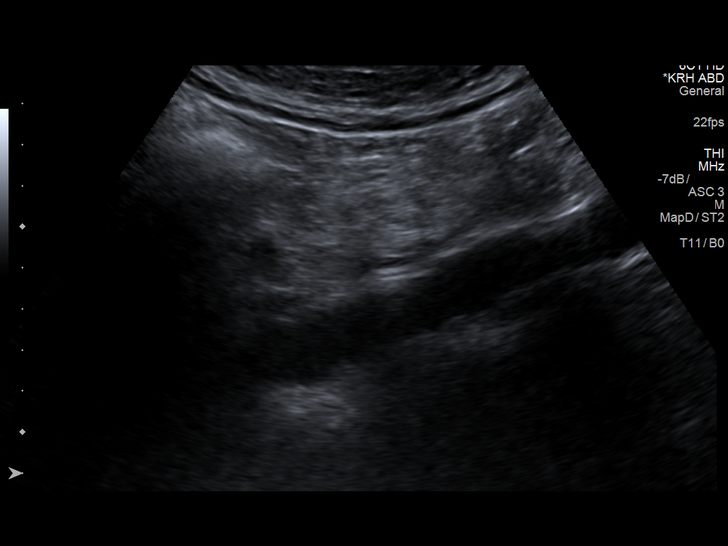
[im 9/22]
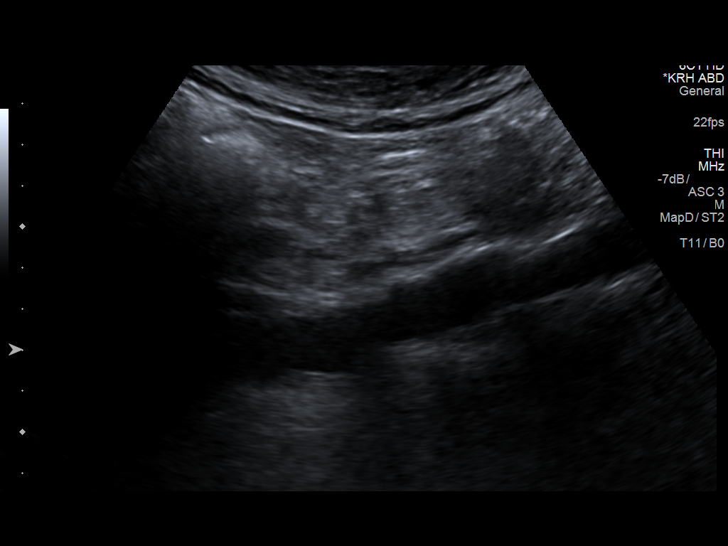
[im 11/22]
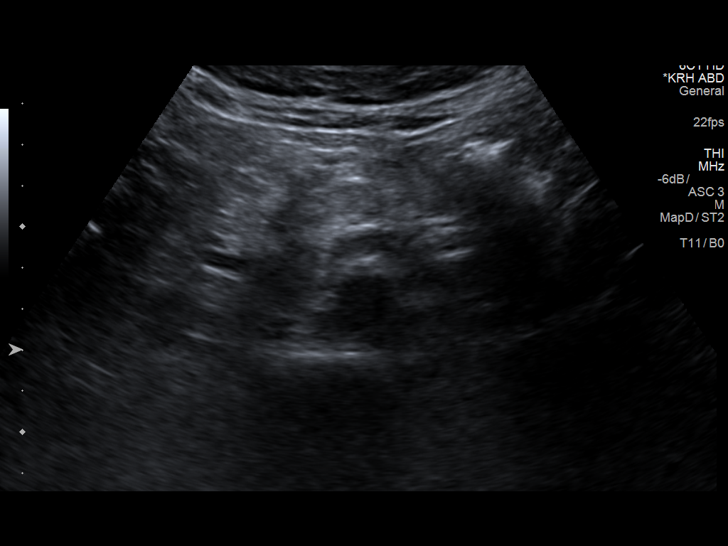
[im 12/22]
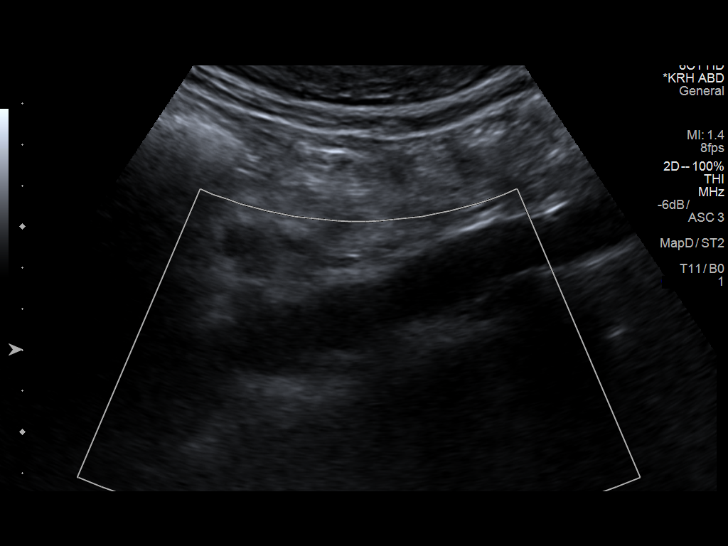
[im 14/22]
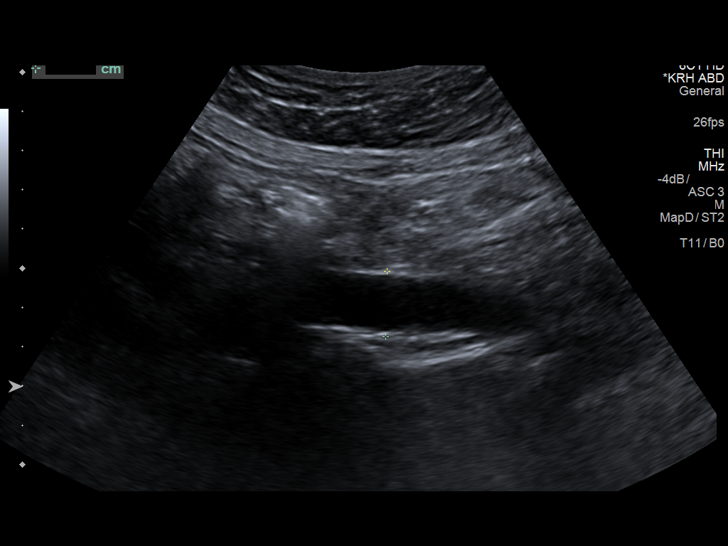
[im 15/22]
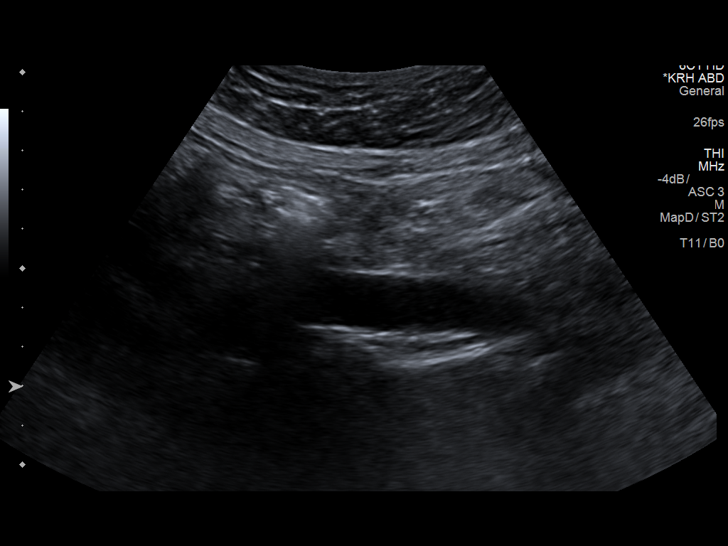
[im 17/22]
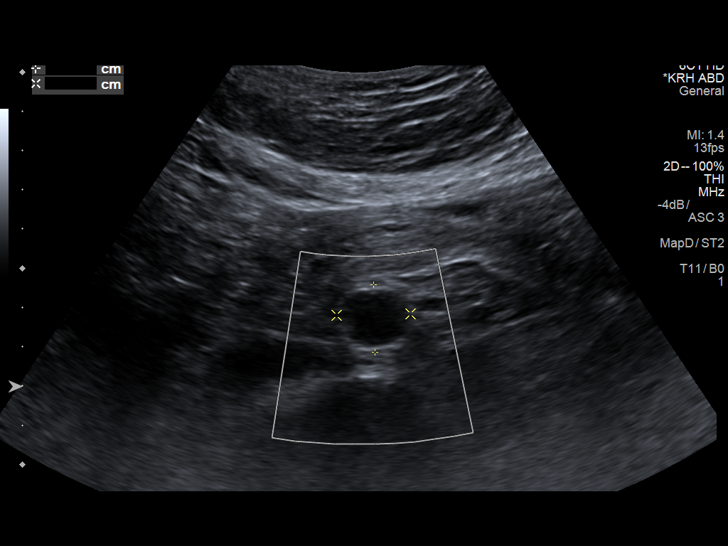
[im 19/22]
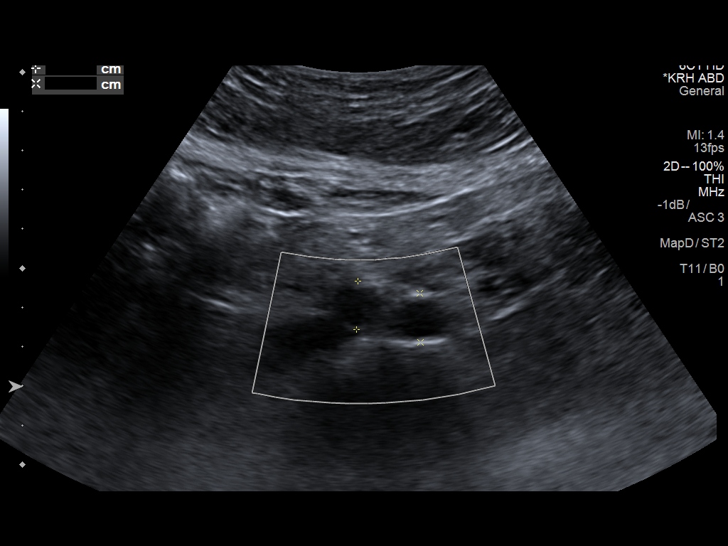
[im 20/22]
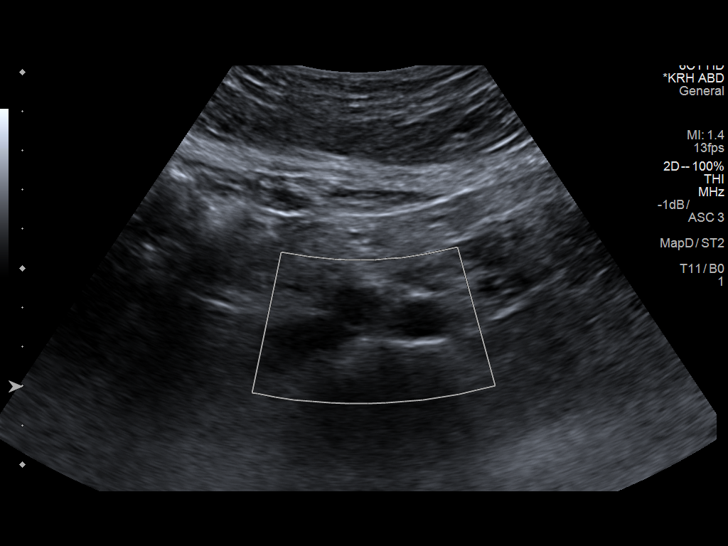
[im 22/22]
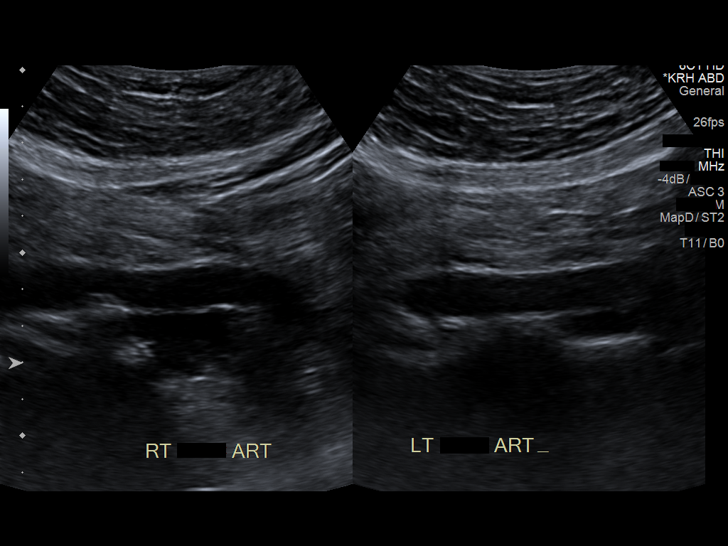

[14 of 22 positions shown; findings below may reference images not displayed]

FINDINGS: Abdominal aortic measurements as follows:

Proximal:  2.7 cm

Mid:  1.8 cm

Distal:  1.9 cm

Proximal common iliac arteries  normal in caliber.
IMPRESSION: Ectatic abdominal aorta at risk for aneurysm development. Recommend
followup by ultrasound in 5 years. This recommendation follows ACR
consensus guidelines: White Paper of the ACR Incidental Findings
Committee II on Vascular Findings. [HOSPITAL] 5306;

## 2018-10-04 ENCOUNTER — Other Ambulatory Visit: Payer: Self-pay | Admitting: Family Medicine

## 2018-10-08 ENCOUNTER — Encounter: Payer: Self-pay | Admitting: Family Medicine

## 2018-10-09 ENCOUNTER — Encounter: Payer: Self-pay | Admitting: Family Medicine

## 2018-10-09 ENCOUNTER — Other Ambulatory Visit: Payer: Self-pay

## 2018-10-09 ENCOUNTER — Ambulatory Visit (INDEPENDENT_AMBULATORY_CARE_PROVIDER_SITE_OTHER): Payer: 59 | Admitting: Family Medicine

## 2018-10-09 DIAGNOSIS — N529 Male erectile dysfunction, unspecified: Secondary | ICD-10-CM | POA: Diagnosis not present

## 2018-10-09 MED ORDER — SILDENAFIL CITRATE 100 MG PO TABS: 50.0000 mg | ORAL_TABLET | Freq: Every day | ORAL | 0 refills | Status: DC | PRN

## 2018-10-09 MED FILL — SILDENAFIL CITRATE 100 MG T: 100 | 23 days supply | Qty: 6 | Fill #0

## 2018-10-09 NOTE — Progress Notes (Signed)
Chief Complaint  Patient presents with  . Follow-up    Subjective: Patient is a 68 y.o. male here for discussion of Viagra. Due to outbreak, we are interacting via web portal for an electronic face-to-face visit. I verified patient's ID using 2 identifiers.   Pt has a hx of ED. Used Viagra before it went generic around 10 yrs ago and it helped. He is not 10 yrs older and was recently speaking of being intimate with his wife. While he has not had issues with ED, he realizes he is older and may not perform as well as he used to and would like something on hand. No AE's other than flushing with previous rx.   ROS: GU: As noted in HPI  Past Medical History:  Diagnosis Date  . Anxiety   . Appendicitis   . GERD (gastroesophageal reflux disease)   . Hyperlipidemia   . Hypertension   . Hypothyroidism     Objective: No conversational dyspnea Age appropriate judgment and insight Nml affect and mood  Assessment and Plan: Erectile dysfunction, unspecified erectile dysfunction type - Plan: sildenafil (VIAGRA) 100 MG tablet  Orders as above. Will send message regarding alt if above is too expensive. Fu prn. The patient voiced understanding and agreement to the plan.  [place], DO 10/09/18  2:15 PM

## 2018-10-17 ENCOUNTER — Ambulatory Visit (INDEPENDENT_AMBULATORY_CARE_PROVIDER_SITE_OTHER): Payer: 59

## 2018-10-17 ENCOUNTER — Other Ambulatory Visit: Payer: Self-pay

## 2018-10-17 ENCOUNTER — Ambulatory Visit (INDEPENDENT_AMBULATORY_CARE_PROVIDER_SITE_OTHER): Payer: 59 | Admitting: Podiatry

## 2018-10-17 ENCOUNTER — Encounter: Payer: Self-pay | Admitting: Podiatry

## 2018-10-17 VITALS — BP 142/76 | HR 68 | Temp 98.1°F | Resp 16

## 2018-10-17 DIAGNOSIS — M2012 Hallux valgus (acquired), left foot: Secondary | ICD-10-CM

## 2018-10-17 DIAGNOSIS — M779 Enthesopathy, unspecified: Secondary | ICD-10-CM

## 2018-10-17 DIAGNOSIS — M6740 Ganglion, unspecified site: Secondary | ICD-10-CM

## 2018-10-17 DIAGNOSIS — M7752 Other enthesopathy of left foot: Secondary | ICD-10-CM

## 2018-10-17 DIAGNOSIS — M2011 Hallux valgus (acquired), right foot: Secondary | ICD-10-CM

## 2018-10-17 MED ORDER — TRIAMCINOLONE ACETONIDE 10 MG/ML IJ SUSP
10.0000 mg | Freq: Once | INTRAMUSCULAR | Status: AC
  Administered 2018-10-17: 10 mg

## 2018-10-17 NOTE — Patient Instructions (Signed)
Bunion  A bunion is a bump on the base of the big toe that forms when the bones of the big toe joint move out of position. Bunions may be small at first, but they often get larger over time. They can make walking painful. What are the causes? A bunion may be caused by:  Wearing narrow or pointed shoes that force the big toe to press against the other toes.  Abnormal foot development that causes the foot to roll inward (pronate).  Changes in the foot that are caused by certain diseases, such as rheumatoid arthritis or polio.  A foot injury. What increases the risk? The following factors may make you more likely to develop this condition:  Wearing shoes that squeeze the toes together.  Having certain diseases, such as: ? Rheumatoid arthritis. ? Polio. ? Cerebral palsy.  Having family members who have bunions.  Being born with a foot deformity, such as flat feet or low arches.  Doing activities that put a lot of pressure on the feet, such as ballet dancing. What are the signs or symptoms? The main symptom of a bunion is a noticeable bump on the big toe. Other symptoms may include:  Pain.  Swelling around the big toe.  Redness and inflammation.  Thick or hardened skin on the big toe or between the toes.  Stiffness or loss of motion in the big toe.  Trouble with walking. How is this diagnosed? A bunion may be diagnosed based on your symptoms, medical history, and activities. You may have tests, such as:  X-rays. These allow your health care provider to check the position of the bones in your foot and look for damage to your joint. They also help your health care provider determine the severity of your bunion and the best way to treat it.  Joint aspiration. In this test, a sample of fluid is removed from the toe joint. This test may be done if you are in a lot of pain. It helps rule out diseases that cause painful swelling of the joints, such as arthritis. How is this  treated? Treatment depends on the severity of your symptoms. The goal of treatment is to relieve symptoms and prevent the bunion from getting worse. Your health care provider may recommend:  Wearing shoes that have a wide toe box.  Using bunion pads to cushion the affected area.  Taping your toes together to keep them in a normal position.  Placing a device inside your shoe (orthotics) to help reduce pressure on your toe joint.  Taking medicine to ease pain, inflammation, and swelling.  Applying heat or ice to the affected area.  Doing stretching exercises.  Surgery to remove scar tissue and move the toes back into their normal position. This treatment is rare. Follow these instructions at home: Managing pain, stiffness, and swelling   If directed, put ice on the painful area: ? Put ice in a plastic bag. ? Place a towel between your skin and the bag. ? Leave the ice on for 20 minutes, 2-3 times a day. Activity   If directed, apply heat to the affected area before you exercise. Use the heat source that your health care provider recommends, such as a moist heat pack or a heating pad. ? Place a towel between your skin and the heat source. ? Leave the heat on for 20-30 minutes. ? Remove the heat if your skin turns bright red. This is especially important if you are unable to feel pain,   heat, or cold. You may have a greater risk of getting burned.  Do exercises as told by your health care provider. General instructions  Support your toe joint with proper footwear, shoe padding, or taping as told by your health care provider.  Take over-the-counter and prescription medicines only as told by your health care provider.  Keep all follow-up visits as told by your health care provider. This is important. Contact a health care provider if your symptoms:  Get worse.  Do not improve in 2 weeks. Get help right away if you have:  Severe pain and trouble with walking. Summary  A  bunion is a bump on the base of the big toe that forms when the bones of the big toe joint move out of position.  Bunions can make walking painful.  Treatment depends on the severity of your symptoms.  Support your toe joint with proper footwear, shoe padding, or taping as told by your health care provider. This information is not intended to replace advice given to you by your health care provider. Make sure you discuss any questions you have with your health care provider. Document Released: 06/13/2005 Document Revised: 10/24/2017 Document Reviewed: 10/24/2017 Elsevier Interactive Patient Education  2019 [employer]vier Inc[employer]  

## 2018-10-17 NOTE — Progress Notes (Signed)
   Subjective:    Patient ID: Darius Mitchell, male    DOB: 12-08-1950, 68 y.o.   MRN: [id number]  HPI    Review of Systems  All other systems reviewed and are negative.      Objective:   Physical Exam        Assessment & Plan:

## 2018-10-18 NOTE — Progress Notes (Signed)
Subjective:   Patient ID: Darius Mitchell, male   DOB: 68 y.o.   MRN: [id number]   HPI Patient presents stating he has had a painful structural bunion deformity that is been going on for years and the left big toe joint has started to get really sore over the last few months.  Does not remember specific injury but states it is become bothersome patient also has small lesion on the second toe that was removed with biopsy and it came back benign but it has regrown again he wanted to get it checked.  Patient does not smoke likes to be active   Review of Systems  All other systems reviewed and are negative.       Objective:  Physical Exam Vitals signs and nursing note reviewed.  Constitutional:      Appearance: He is well-developed.  Pulmonary:     Effort: Pulmonary effort is normal.  Musculoskeletal: Normal range of motion.  Skin:    General: Skin is warm.  Neurological:     Mental Status: He is alert.     Neurovascular status found to be intact muscle strength is adequate range of motion within normal limits with patient found to have structural bunion deformity right over left with inflammation redness around the first MPJ left that is painful when pressed.  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  I did discuss bunions and they may not require correction at one point I do not really recommend correction of the cyst unless it became painful  X-rays  indicate there is structural bunion deformity bilateral with indications of soft tissue inflammation left

## 2018-11-23 ENCOUNTER — Emergency Department (HOSPITAL_BASED_OUTPATIENT_CLINIC_OR_DEPARTMENT_OTHER): Payer: 59

## 2018-11-23 ENCOUNTER — Encounter (HOSPITAL_BASED_OUTPATIENT_CLINIC_OR_DEPARTMENT_OTHER): Payer: Self-pay | Admitting: *Deleted

## 2018-11-23 ENCOUNTER — Emergency Department (HOSPITAL_BASED_OUTPATIENT_CLINIC_OR_DEPARTMENT_OTHER)
Admission: EM | Admit: 2018-11-23 | Discharge: 2018-11-23 | Disposition: A | Discharge status: Home / Self Care | Payer: 59 | Attending: Emergency Medicine | Admitting: Emergency Medicine

## 2018-11-23 ENCOUNTER — Other Ambulatory Visit: Payer: Self-pay

## 2018-11-23 DIAGNOSIS — Z7901 Long term (current) use of anticoagulants: Secondary | ICD-10-CM | POA: Diagnosis not present

## 2018-11-23 DIAGNOSIS — E039 Hypothyroidism, unspecified: Secondary | ICD-10-CM | POA: Insufficient documentation

## 2018-11-23 DIAGNOSIS — I4891 Unspecified atrial fibrillation: Secondary | ICD-10-CM | POA: Insufficient documentation

## 2018-11-23 DIAGNOSIS — I10 Essential (primary) hypertension: Secondary | ICD-10-CM | POA: Diagnosis not present

## 2018-11-23 DIAGNOSIS — F17210 Nicotine dependence, cigarettes, uncomplicated: Secondary | ICD-10-CM | POA: Diagnosis not present

## 2018-11-23 DIAGNOSIS — Z79899 Other long term (current) drug therapy: Secondary | ICD-10-CM | POA: Insufficient documentation

## 2018-11-23 DIAGNOSIS — Z7982 Long term (current) use of aspirin: Secondary | ICD-10-CM | POA: Insufficient documentation

## 2018-11-23 DIAGNOSIS — R079 Chest pain, unspecified: Secondary | ICD-10-CM

## 2018-11-23 DIAGNOSIS — R002 Palpitations: Secondary | ICD-10-CM | POA: Diagnosis present

## 2018-11-23 LAB — CBC
HCT: 41.7 % (ref 39.0–52.0)
Hemoglobin: 14.1 g/dL (ref 13.0–17.0)
MCH: 31.9 pg (ref 26.0–34.0)
MCHC: 33.8 g/dL (ref 30.0–36.0)
MCV: 94.3 fL (ref 80.0–100.0)
Platelets: 149 10*3/uL — ABNORMAL LOW (ref 150–400)
RBC: 4.42 MIL/uL (ref 4.22–5.81)
RDW: 12.9 % (ref 11.5–15.5)
WBC: 10.3 10*3/uL (ref 4.0–10.5)
nRBC: 0 % (ref 0.0–0.2)

## 2018-11-23 LAB — TROPONIN I: Troponin I: <0.03 ng/mL (ref <0.03)

## 2018-11-23 LAB — BASIC METABOLIC PANEL
Anion gap: 9 (ref 5–15)
BUN: 20 mg/dL (ref 8–23)
CO2: 24 mmol/L (ref 22–32)
Calcium: 9.1 mg/dL (ref 8.9–10.3)
Chloride: 105 mmol/L (ref 98–111)
Creatinine, Ser: 1.16 mg/dL (ref 0.61–1.24)
GFR calc Af Amer: >60 mL/min (ref >60)
GFR calc non Af Amer: >60 mL/min (ref >60)
Glucose, Bld: 123 mg/dL — ABNORMAL HIGH (ref 70–99)
Potassium: 3.1 mmol/L — ABNORMAL LOW (ref 3.5–5.1)
Sodium: 138 mmol/L (ref 135–145)

## 2018-11-23 LAB — MAGNESIUM: Magnesium: 2 mg/dL (ref 1.7–2.4)

## 2018-11-23 MED ORDER — APIXABAN 2.5 MG PO TABS
5.0000 mg | ORAL_TABLET | Freq: Once | ORAL | Status: AC
  Administered 2018-11-23: 5 mg via ORAL
  Filled 2018-11-23: qty 2

## 2018-11-23 MED ORDER — APIXABAN 5 MG PO TABS: 5.0000 mg | ORAL_TABLET | Freq: Two times a day (BID) | ORAL | 0 refills | Status: DC

## 2018-11-23 MED ORDER — ETOMIDATE 2 MG/ML IV SOLN
5.0000 mg | Freq: Once | INTRAVENOUS | Status: AC
  Administered 2018-11-23: 5 mg via INTRAVENOUS
  Filled 2018-11-23: qty 10

## 2018-11-23 MED ORDER — METOPROLOL SUCCINATE ER 25 MG PO TB24: 25.0000 mg | ORAL_TABLET | Freq: Every day | ORAL | 0 refills | Status: DC

## 2018-11-23 MED ORDER — SODIUM CHLORIDE 0.9% FLUSH
3.0000 mL | Freq: Once | INTRAVENOUS | Status: DC
  Filled 2018-11-23: qty 3

## 2018-11-23 NOTE — ED Notes (Signed)
Pt. Daughter Lyndle Herrlich called to check on the Pt.   RN explained the Pt. Is in stable condition with Cardioversion to be done and that the Pt. Wants the cardioversion verses being admitted to the hospital

## 2018-11-23 NOTE — ED Notes (Signed)
Pt. Was cutting trees and began to feel weak with feeling like his heart was racing.  Pt. Here in ED states he has no chest pain.  Pt. Is alert and oriented with no nausea.  Pt. Reports not feeling like his heart is racing at this time.  EDP at bedside.

## 2018-11-23 NOTE — ED Notes (Signed)
Cardioverted with 100 joules

## 2018-11-23 NOTE — ED Provider Notes (Addendum)
[facility] Provider Note   CSN: [id number] Arrival date & time: 11/23/18  1807    History   Chief Complaint Chief Complaint  Patient presents with  . Palpitations    HPI Darius Mitchell is a 68 y.o. male.     The history is provided by the patient.  Palpitations  Palpitations quality:  Irregular Onset quality:  Sudden Duration:  2 hours Timing:  Constant Progression:  Unchanged Chronicity:  New Context: exercise (was cutting down tress)   Relieved by:  Nothing Worsened by:  Nothing Associated symptoms: chest pain (resolved), dizziness, numbness (brief left arm parasthesia) and weakness   Associated symptoms: no back pain, no cough, no shortness of breath and no vomiting   Risk factors: no hx of atrial fibrillation     Past Medical History:  Diagnosis Date  . Anxiety   . Appendicitis   . GERD (gastroesophageal reflux disease)   . Hyperlipidemia   . Hypertension   . Hypothyroidism     Patient Active Problem List   Diagnosis Date Noted  . Swelling of both lower extremities 11/29/2017  . Diverticulosis of colon without hemorrhage 03/17/2016  . Carpal tunnel syndrome of right wrist 10/23/2015  . Low testosterone 05/15/2015  . BMI 30.0-30.9,adult 05/15/2015  . Screening for ischemic heart disease 04/30/2014  . Visit for preventive health examination 04/30/2014  . Prostate cancer screening encounter, options and risks discussed 04/30/2014  . Essential hypertension, benign 03/25/2014  . Hyperlipidemia 03/25/2014  . GERD (gastroesophageal reflux disease) 03/25/2014  . Anxiety and depression 03/25/2014  . Hypothyroidism 03/25/2014    Past Surgical History:  Procedure Laterality Date  . APPENDECTOMY  1971  . [place] Medications    Prior to Admission medications   Medication Sig Start Date End Date Taking? Authorizing Provider  amLODipine (NORVASC) 5 MG tablet TAKE 1 TABLET BY MOUTH  DAILY  06/18/18  Yes [name], DO  aspirin EC 81 MG tablet Take 1 tablet (81 mg total) by mouth daily. 06/18/18  Yes [name], DO  atorvastatin (LIPITOR) 80 MG tablet TAKE 1 TABLET BY MOUTH  DAILY 09/27/18  Yes Wendling, Crosby Oyster, DO  levothyroxine (SYNTHROID, LEVOTHROID) 112 MCG tablet TAKE 1 TABLET BY MOUTH  DAILY BEFORE BREAKFAST 08/30/18  Yes [name], DO  losartan (COZAAR) 100 MG tablet TAKE 1 TABLET BY MOUTH  DAILY 10/08/18  Yes [name], DO  Multiple Vitamins-Minerals (CENTRUM SILVER ULTRA MENS) TABS Take by mouth daily.   Yes [provider]  omeprazole (PRILOSEC) 40 MG capsule TAKE 1 CAPSULE BY MOUTH  DAILY 09/17/18  Yes [name], DO  PARoxetine (PAXIL) 30 MG tablet TAKE 1 TABLET BY MOUTH  DAILY 09/05/18  Yes [name], DO  apixaban (ELIQUIS) 5 MG TABS tablet Take 1 tablet (5 mg total) by mouth 2 (two) times daily for 30 days. 11/23/18 12/23/18  Arlyce Circle, DO  metoprolol succinate (TOPROL-XL) 25 MG 24 hr tablet Take 1 tablet (25 mg total) by mouth daily for 30 days. 11/23/18 12/23/18  Rhea Kaelin, DO  sildenafil (VIAGRA) 100 MG tablet Take 0.5-1 tablets (50-100 mg total) by mouth daily as needed for erectile dysfunction. 10/09/18   [name], DO    Family History Family History  Problem Relation Age of Onset  . Stroke Mother 37       Deceased  . Heart attack Mother   .  Lymphoma Father 68       Deceased  . Heart disease Father   . Stroke Maternal Grandmother   . Stomach cancer Paternal Grandfather   . Healthy Brother        x2  . Healthy Sister   . Hypertension Daughter   . Healthy Daughter     Social History Social History   Tobacco Use  . Smoking status: Current Some Day Smoker    Packs/day: 0.25    Years: 25.00    Pack years: 6.25    Types: Cigarettes    Last attempt to quit: 06/28/1979    Years since quitting: 39.4  . Smokeless tobacco: Never Used  . Tobacco  comment: Only when playing golf  Substance Use Topics  . Alcohol use: Never    Frequency: Never  . Drug use: Never     Allergies   Codeine and Niacin and related   Review of Systems Review of Systems  Constitutional: Negative for chills and fever.  HENT: Negative for ear pain and sore throat.   Eyes: Negative for pain and visual disturbance.  Respiratory: Negative for cough and shortness of breath.   Cardiovascular: Positive for chest pain (resolved) and palpitations.  Gastrointestinal: Negative for abdominal pain and vomiting.  Genitourinary: Negative for dysuria and hematuria.  Musculoskeletal: Negative for arthralgias and back pain.  Skin: Negative for color change and rash.  Neurological: Positive for dizziness, weakness and numbness (brief left arm parasthesia). Negative for seizures and syncope.  All other systems reviewed and are negative.    Physical Exam Updated Vital Signs BP (!) 160/70 (BP Location: Left Arm)   Pulse 81   Temp 97.8 F (36.6 C) (Oral)   Resp 15   Ht 5\' 11"  (1.803 m)   Wt 90.7 kg   SpO2 100%   BMI 27.89 kg/m   Physical Exam Vitals signs and nursing note reviewed.  Constitutional:      General: He is not in acute distress.    Appearance: He is well-developed. He is not ill-appearing.  HENT:     Head: Normocephalic and atraumatic.     Nose: Nose normal.     Mouth/Throat:     Mouth: Mucous membranes are moist.  Eyes:     Extraocular Movements: Extraocular movements intact.     Conjunctiva/sclera: Conjunctivae normal.     Pupils: Pupils are equal, round, and reactive to light.  Neck:     Musculoskeletal: Neck supple.  Cardiovascular:     Rate and Rhythm: Tachycardia present. Rhythm irregular.     Pulses: Normal pulses.     Heart sounds: Normal heart sounds. No murmur.  Pulmonary:     Effort: Pulmonary effort is normal. No respiratory distress.     Breath sounds: Normal breath sounds.  Abdominal:     Palpations: Abdomen is soft.      Tenderness: There is no abdominal tenderness.  Musculoskeletal: Normal range of motion.  Skin:    General: Skin is warm and dry.  Neurological:     General: No focal deficit present.     Mental Status: He is alert and oriented to person, place, and time.     Cranial Nerves: No cranial nerve deficit.     Sensory: No sensory deficit.     Motor: No weakness.     Coordination: Coordination normal.     Gait: Gait normal.      ED Treatments / Results  Labs (all labs ordered are listed, but only abnormal  results are displayed) Labs Reviewed  BASIC METABOLIC PANEL - Abnormal; Notable for the following components:      Result Value   Potassium 3.1 (*)    Glucose, Bld 123 (*)    All other components within normal limits  CBC - Abnormal; Notable for the following components:   Platelets 149 (*)    All other components within normal limits  TROPONIN I  MAGNESIUM    EKG EKG Interpretation  Date/Time:  Friday Nov 23 2018 19:15:54 EDT Ventricular Rate:  82 PR Interval:    QRS Duration: 105 QT Interval:  383 QTC Calculation: 448 R Axis:   -25 Text Interpretation:  Sinus rhythm Incomplete RBBB and LAFB Low voltage, precordial leads Confirmed by Lennice Sites [id number]) on 11/23/2018 7:22:48 PM   Radiology Dg Chest Port 1 View  Result Date: 11/23/2018 CLINICAL DATA:  Chest pain EXAM: PORTABLE CHEST 1 VIEW COMPARISON:  None. FINDINGS: Normal heart size. Normal mediastinal contour. No pneumothorax. No pleural effusion. Lungs appear clear, with no acute consolidative airspace disease and no pulmonary edema. IMPRESSION: No active disease. Electronically Signed   By: Ilona Sorrel M.D.   On: 11/23/2018 19:09    Procedures .Cardioversion Date/Time: 11/23/2018 7:44 PM Performed by: Lennice Sites, DO Authorized by: Lennice Sites, DO   Consent:    Consent obtained:  Emergent situation   Consent given by:  Patient   Risks discussed:  Cutaneous burn, death, induced arrhythmia and pain    Alternatives discussed:  Rate-control medication Pre-procedure details:    Cardioversion basis:  Emergent   Rhythm:  Atrial fibrillation   Electrode placement:  Anterior-posterior Patient sedated: Yes. Refer to sedation procedure documentation for details of sedation.  Attempt one:    Cardioversion mode:  Synchronous   Shock (Joules):  100   Shock outcome:  Conversion to normal sinus rhythm Post-procedure details:    Patient status:  Awake   Patient tolerance of procedure:  Tolerated well, no immediate complications .Sedation Date/Time: 11/23/2018 7:45 PM Performed by: Lennice Sites, DO Authorized by: Lennice Sites, DO   Consent:    Consent obtained:  Verbal and emergent situation   Consent given by:  Patient   Risks discussed:  Allergic reaction, dysrhythmia, inadequate sedation, nausea, prolonged hypoxia resulting in organ damage, prolonged sedation necessitating reversal, respiratory compromise necessitating ventilatory assistance and intubation and vomiting   Alternatives discussed:  Anxiolysis Universal protocol:    Procedure explained and questions answered to patient or proxy's satisfaction: yes     Relevant documents present and verified: yes     Test results available and properly labeled: yes     Imaging studies available: yes     Required blood products, implants, devices, and special equipment available: yes     Immediately prior to procedure a time out was called: yes   Indications:    Procedure performed:  Cardioversion Pre-sedation assessment:    Time since last food or drink:  1 hout   ASA classification: class 1 - normal, healthy patient     Neck mobility: normal     Mouth opening:  3 or more finger widths   Thyromental distance:  4 finger widths   Mallampati score:  I - soft palate, uvula, fauces, pillars visible   Pre-sedation assessments completed and reviewed: airway patency, cardiovascular function, hydration status, mental status, nausea/vomiting, pain  level, respiratory function and temperature     Pre-sedation assessment completed:  11/23/2018 7:46 PM Immediate pre-procedure details:    Reassessment: Patient reassessed immediately  prior to procedure     Reviewed: vital signs, relevant labs/tests and NPO status     Verified: bag valve mask available, emergency equipment available, intubation equipment available, IV patency confirmed, oxygen available, reversal medications available and suction available   Procedure details (see MAR for exact dosages):    Preoxygenation:  Nasal cannula   Sedation:  Etomidate   Intra-procedure monitoring:  Blood pressure monitoring, cardiac monitor, continuous capnometry, continuous pulse oximetry, frequent LOC assessments and frequent vital sign checks   Intra-procedure events: none     Total Provider sedation time (minutes):  15 Post-procedure details:    Post-sedation assessment completed:  11/23/2018 7:46 PM   Attendance: Constant attendance by certified staff until patient recovered     Recovery: Patient returned to pre-procedure baseline     Post-sedation assessments completed and reviewed: airway patency, cardiovascular function, hydration status, mental status, nausea/vomiting, pain level, respiratory function and temperature     Patient is stable for discharge or admission: yes     Patient tolerance:  Tolerated well, no immediate complications .Critical Care Performed by: Lennice Sites, DO Authorized by: Lennice Sites, DO   Critical care provider statement:    Critical care time (minutes):  35   Critical care time was exclusive of:  Separately billable procedures and treating other patients and teaching time   Critical care was necessary to treat or prevent imminent or life-threatening deterioration of the following conditions:  Cardiac failure   Critical care was time spent personally by me on the following activities:  Blood draw for specimens, development of treatment plan with patient or  surrogate, discussions with consultants, evaluation of patient's response to treatment, examination of patient, obtaining history from patient or surrogate, ordering and performing treatments and interventions, ordering and review of laboratory studies, ordering and review of radiographic studies, re-evaluation of patient's condition, pulse oximetry and review of old charts   I assumed direction of critical care for this patient from another provider in my specialty: no     (including critical care time)  Medications Ordered in ED Medications  sodium chloride flush (NS) 0.9 % injection 3 mL (3 mLs Intravenous Not Given 11/23/18 1822)  apixaban (ELIQUIS) tablet 5 mg (has no administration in time range)  etomidate (AMIDATE) injection 5 mg (5 mg Intravenous Given 11/23/18 1902)     Initial Impression / Assessment and Plan / ED Course  I have reviewed the triage vital signs and the nursing notes.  Pertinent labs & imaging results that were available during my care of the patient were reviewed by me and considered in my medical decision making (see chart for details).     Darius Mitchell is a 68 year old male history of hypertension, high cholesterol who presents to the ED with palpitations.  Patient found to have atrial fibrillation RVR on EKG upon arrival.  Heart rate in the 130-160s.  Had some mild chest discomfort prior to arrival but overall appears stable.  Symptoms started 2 hours ago.  No history of the same.  He is well within the window of cardioversion which he is amenable to.  Patient was sedated with etomidate and had successful cardioversion to a normal sinus rhythm.  Discussed the case with cardiology on-call and they recommend starting the patient on metoprolol and Eliquis given that he has a chads vasc score of 2.  Lab work was overall unremarkable including troponin.  No significant anemia, electrolyte abnormality.  Chest x-ray normal.  Patient tolerated sedation and cardioversion well.  Was discharged at his baseline.  Understands return precautions.  Has follow-up with atrial fibrillation clinic next week.  Given return precautions.  This chart was dictated using voice recognition software.  Despite best efforts to proofread,  errors can occur which can change the documentation meaning.    Final Clinical Impressions(s) / ED Diagnoses   Final diagnoses:  Atrial fibrillation with RVR [facility])    ED Discharge Orders         Ordered    Amb referral to AFIB Clinic     11/23/18 1833    apixaban (ELIQUIS) 5 MG TABS tablet  2 times daily     11/23/18 1950    metoprolol succinate (TOPROL-XL) 25 MG 24 hr tablet  Daily     11/23/18 1950           Lennice Sites, DO 11/23/18 [place], [place], DO 11/23/18 1956

## 2018-11-23 NOTE — ED Triage Notes (Signed)
After cutting trees and bushes with a chain saw and when he got home he had an episode of nausea, burping and an irregular pulse. Tingling in his left hand.

## 2018-11-23 NOTE — ED Notes (Signed)
Pt. Daughter Lyndle Herrlich phone [phone]

## 2018-11-28 ENCOUNTER — Other Ambulatory Visit: Payer: Self-pay | Admitting: Family Medicine

## 2018-11-28 DIAGNOSIS — F329 Major depressive disorder, single episode, unspecified: Secondary | ICD-10-CM

## 2018-11-28 DIAGNOSIS — F32A Depression, unspecified: Secondary | ICD-10-CM

## 2018-12-03 ENCOUNTER — Encounter: Payer: Self-pay | Admitting: Cardiology

## 2018-12-03 ENCOUNTER — Telehealth (INDEPENDENT_AMBULATORY_CARE_PROVIDER_SITE_OTHER): Payer: 59 | Admitting: Cardiology

## 2018-12-03 ENCOUNTER — Other Ambulatory Visit: Payer: Self-pay

## 2018-12-03 DIAGNOSIS — I10 Essential (primary) hypertension: Secondary | ICD-10-CM | POA: Diagnosis not present

## 2018-12-03 DIAGNOSIS — I480 Paroxysmal atrial fibrillation: Secondary | ICD-10-CM

## 2018-12-03 DIAGNOSIS — K219 Gastro-esophageal reflux disease without esophagitis: Secondary | ICD-10-CM

## 2018-12-03 DIAGNOSIS — M7989 Other specified soft tissue disorders: Secondary | ICD-10-CM

## 2018-12-03 HISTORY — DX: Paroxysmal atrial fibrillation: I48.0

## 2018-12-03 NOTE — Progress Notes (Signed)
Virtual Visit via Video Note   This visit type was conducted due to national recommendations for restrictions regarding the COVID-19 Pandemic (e.g. social distancing) in an effort to limit this patient's exposure and mitigate transmission in our community.  Due to his co-morbid illnesses, this patient is at least at moderate risk for complications without adequate follow up.  This format is felt to be most appropriate for this patient at this time.  All issues noted in this document were discussed and addressed.  A limited physical exam was performed with this format.  Please refer to the patient's chart for his consent to telehealth for [facility].  Evaluation Performed:  Follow-up visit  This visit type was conducted due to national recommendations for restrictions regarding the COVID-19 Pandemic (e.g. social distancing).  This format is felt to be most appropriate for this patient at this time.  All issues noted in this document were discussed and addressed.  No physical exam was performed (except for noted visual exam findings with Video Visits).  Please refer to the patient's chart (MyChart message for video visits and phone note for telephone visits) for the patient's consent to telehealth for [facility].  Date:  12/03/2018  ID: [name], DOB 1951/03/03, MRN [id number]   Patient Location: [address] [place] [address]   Provider location:   [place] Office  PCP:  [name], DO  Cardiologist:  Jenne Campus, MD     Chief Complaint: I was in the hospital because of atrial fibrillation  History of Present Illness:    Darius Mitchell is a 68 y.o. male  who presents via audio/video conferencing for a telehealth visit today.  With hypertension, dyslipidemia, GERD.  Recently he was working the garden in the morning he did not have much to drink much to eat that day.  Eventually started feeling some palpitations did not feel well went to  the emergency room was noted to be in atrial fibrillation with fast ventricular rate was cardioverted electrically successfully and now he is talking to me about that issue this is the first episode of atrial fibrillation that he had.  He clearly felt that so I do not think of previous episodes.  Interestingly last echocardiogram we did last year showed some enlargement of both atria's.  Overall doing great right now and left awake oriented x3 not in any distress denies having any problem and he is working hard on able to do it without difficulties.   The patient does not have symptoms concerning for COVID-19 infection (fever, chills, cough, or new SHORTNESS OF BREATH).    Prior CV studies:   The following studies were reviewed today:  Echocardiogram in 2019 showed normal left ventricle ejection fraction biatrial enlargement     Past Medical History:  Diagnosis Date  . Anxiety   . Appendicitis   . GERD (gastroesophageal reflux disease)   . Hyperlipidemia   . Hypertension   . Hypothyroidism     Past Surgical History:  Procedure Laterality Date  . APPENDECTOMY  1971  . WISDOM TOOTH EXTRACTION  1972     Current Meds  Medication Sig  . amLODipine (NORVASC) 5 MG tablet TAKE 1 TABLET BY MOUTH  DAILY  . apixaban (ELIQUIS) 5 MG TABS tablet Take 1 tablet (5 mg total) by mouth 2 (two) times daily for 30 days.  Marland Kitchen atorvastatin (LIPITOR) 80 MG tablet TAKE 1 TABLET BY MOUTH  DAILY  . levothyroxine (SYNTHROID, LEVOTHROID) 112 MCG  tablet TAKE 1 TABLET BY MOUTH  DAILY BEFORE BREAKFAST  . losartan (COZAAR) 100 MG tablet TAKE 1 TABLET BY MOUTH  DAILY  . metoprolol succinate (TOPROL-XL) 25 MG 24 hr tablet Take 1 tablet (25 mg total) by mouth daily for 30 days.  . Multiple Vitamins-Minerals (CENTRUM SILVER ULTRA MENS) TABS Take by mouth daily.  Marland Kitchen omeprazole (PRILOSEC) 40 MG capsule TAKE 1 CAPSULE BY MOUTH  DAILY  . PARoxetine (PAXIL) 30 MG tablet TAKE 1 TABLET BY MOUTH ONCE DAILY  . sildenafil  (VIAGRA) 100 MG tablet Take 0.5-1 tablets (50-100 mg total) by mouth daily as needed for erectile dysfunction.      Family History: The patient's family history includes Healthy in his brother, daughter, and sister; Heart attack in his mother; Heart disease in his father; Hypertension in his daughter; Lymphoma (age of onset: 36) in his father; Stomach cancer in his paternal grandfather; Stroke in his maternal grandmother; Stroke (age of onset: 24) in his mother.   ROS:   Please see the history of present illness.     All other systems reviewed and are negative.   Labs/Other Tests and Data Reviewed:     Recent Labs: 06/18/2018: TSH 2.72 11/23/2018: BUN 20; Creatinine, Ser 1.16; Hemoglobin 14.1; Magnesium 2.0; Platelets 149; Potassium 3.1; Sodium 138  Recent Lipid Panel    Component Value Date/Time   CHOL 183 05/24/2017 0803   TRIG 197.0 (H) 05/24/2017 0803   HDL 39.80 05/24/2017 0803   CHOLHDL 5 05/24/2017 0803   VLDL 39.4 05/24/2017 0803   LDLCALC 104 (H) 05/24/2017 0803      Exam:    Vital Signs:  There were no vitals taken for this visit.    Wt Readings from Last 3 Encounters:  11/23/18 200 lb (90.7 kg)  06/18/18 212 lb (96.2 kg)  01/08/18 214 lb (97.1 kg)     Well nourished, well developed in no acute distress. Alert awake and at the time 3 3 talking of a video link looking good without any distress  Diagnosis for this visit:   1. Essential hypertension, benign   2. Paroxysmal atrial fibrillation (HCC)   3. Swelling of both lower extremities   4. Gastroesophageal reflux disease without esophagitis      ASSESSMENT & PLAN:    1.  Essential hypertension blood pressure well controlled continue present management. 2.  Paroxysmal atrial fibrillation was spent Gradle of time I explained to him what atrial fibrillation is wanted to risk I told him that his chads 2 Vascor equals 2 and he need to be anticoagulated which she will continue.  He was also taking aspirin  asking to stop that.  We will continue with metoprolol.  I told him When he got episode of atrial fibrillation and he is feeling good meaning he does not have chest pain shortness of breath pressure in the chest he simply can wait as long as he take he is anticoagulation.  I also told him to get up a lot so he can monitor his heart rhythm. 3.  Swelling of lower extremities none present. 4.  Gastroesophageal reflux disease stable from that point review.  COVID-19 Education: The signs and symptoms of COVID-19 were discussed with the patient and how to seek care for testing (follow up with PCP or arrange E-visit).  The importance of social distancing was discussed today.  Patient Risk:   After full review of this patients clinical status, I feel that they are at least moderate risk at  this time.  Time:   Today, I have spent 21 minutes with the patient with telehealth technology discussing pt health issues.  I spent 5 minutes reviewing her chart before the visit.  Visit was finished at 10:01 AM.    Medication Adjustments/Labs and Tests Ordered: Current medicines are reviewed at length with the patient today.  Concerns regarding medicines are outlined above.  No orders of the defined types were placed in this encounter.  Medication changes: No orders of the defined types were placed in this encounter.    Disposition: Follow-up in 6 weeks  Signed, Park Liter, MD, [facility] 12/03/2018 10:01 AM    [place]

## 2018-12-03 NOTE — Addendum Note (Signed)
Addended by: Ashok Norris on: 12/03/2018 10:10 AM   Modules accepted: Orders

## 2018-12-03 NOTE — Patient Instructions (Signed)
Medication Instructions:  Your physician recommends that you continue on your current medications as directed. Please refer to the Current Medication list given to you today.  If you need a refill on your cardiac medications before your next appointment, please call your pharmacy.   Lab work: None.  If you have labs (blood work) drawn today and your tests are completely normal, you will receive your results only by: Marland Kitchen MyChart Message (if you have MyChart) OR . A paper copy in the mail If you have any lab test that is abnormal or we need to change your treatment, we will call you to review the results.  Testing/Procedures: Your physician has requested that you have an echocardiogram. Echocardiography is a painless test that uses sound waves to create images of your heart. It provides your doctor with information about the size and shape of your heart and how well your heart's chambers and valves are working. This procedure takes approximately one hour. There are no restrictions for this procedure.    Follow-Up: At [facility], you and your health needs are our priority.  As part of our continuing mission to provide you with exceptional heart care, we have created designated Provider Care Teams.  These Care Teams include your primary Cardiologist (physician) and Advanced Practice Providers (APPs -  Physician Assistants and Nurse Practitioners) who all work together to provide you with the care you need, when you need it. You will need a follow up appointment in 6 weeks.  Please call our office 2 months in advance to schedule this appointment.  You may see No primary care provider on file. or another member of our [employer] Provider Team in [place]: Shirlee More, MD . Jyl Heinz, MD  Any Other Special Instructions Will Be Listed Below (If Applicable).   Echocardiogram An echocardiogram is a procedure that uses painless sound waves (ultrasound) to produce an image of the heart.  Images from an echocardiogram can provide important information about:  Signs of coronary artery disease (CAD).  Aneurysm detection. An aneurysm is a weak or damaged part of an artery wall that bulges out from the normal force of blood pumping through the body.  Heart size and shape. Changes in the size or shape of the heart can be associated with certain conditions, including heart failure, aneurysm, and CAD.  Heart muscle function.  Heart valve function.  Signs of a past heart attack.  Fluid buildup around the heart.  Thickening of the heart muscle.  A tumor or infectious growth around the heart valves. Tell a health care provider about:  Any allergies you have.  All medicines you are taking, including vitamins, herbs, eye drops, creams, and over-the-counter medicines.  Any blood disorders you have.  Any surgeries you have had.  Any medical conditions you have.  Whether you are pregnant or may be pregnant. What are the risks? Generally, this is a safe procedure. However, problems may occur, including:  Allergic reaction to dye (contrast) that may be used during the procedure. What happens before the procedure? No specific preparation is needed. You may eat and drink normally. What happens during the procedure?   An IV tube may be inserted into one of your veins.  You may receive contrast through this tube. A contrast is an injection that improves the quality of the pictures from your heart.  A gel will be applied to your chest.  A wand-like tool (transducer) will be moved over your chest. The gel will help to  transmit the sound waves from the transducer.  The sound waves will harmlessly bounce off of your heart to allow the heart images to be captured in real-time motion. The images will be recorded on a computer. The procedure may vary among health care providers and hospitals. What happens after the procedure?  You may return to your normal, everyday life,  including diet, activities, and medicines, unless your health care provider tells you not to do that. Summary  An echocardiogram is a procedure that uses painless sound waves (ultrasound) to produce an image of the heart.  Images from an echocardiogram can provide important information about the size and shape of your heart, heart muscle function, heart valve function, and fluid buildup around your heart.  You do not need to do anything to prepare before this procedure. You may eat and drink normally.  After the echocardiogram is completed, you may return to your normal, everyday life, unless your health care provider tells you not to do that. This information is not intended to replace advice given to you by your health care provider. Make sure you discuss any questions you have with your health care provider. Document Released: 06/10/2000 Document Revised: 07/16/2016 Document Reviewed: 07/16/2016 Elsevier Interactive Patient Education  2019 Elsevier Inc.    

## 2018-12-11 ENCOUNTER — Other Ambulatory Visit: Payer: Self-pay

## 2018-12-11 ENCOUNTER — Ambulatory Visit (HOSPITAL_BASED_OUTPATIENT_CLINIC_OR_DEPARTMENT_OTHER)
Admission: RE | Admit: 2018-12-11 | Discharge: 2018-12-11 | Disposition: A | Discharge status: Home / Self Care | Payer: 59 | Source: Ambulatory Visit | Attending: Cardiology | Admitting: Cardiology

## 2018-12-11 DIAGNOSIS — I480 Paroxysmal atrial fibrillation: Secondary | ICD-10-CM

## 2018-12-11 DIAGNOSIS — I10 Essential (primary) hypertension: Secondary | ICD-10-CM

## 2018-12-11 NOTE — Progress Notes (Signed)
  Echocardiogram 2D Echocardiogram has been performed.  Cardell Peach 12/11/2018, 3:51 PM

## 2018-12-12 ENCOUNTER — Telehealth: Payer: Self-pay | Admitting: Emergency Medicine

## 2018-12-12 NOTE — Telephone Encounter (Signed)
Left message for patient to return call to be scheduled for a office visit for ekg per Dr. Merita Norton message.

## 2018-12-17 ENCOUNTER — Other Ambulatory Visit: Payer: Self-pay | Admitting: Emergency Medicine

## 2018-12-17 MED ORDER — APIXABAN 5 MG PO TABS: 5.0000 mg | ORAL_TABLET | Freq: Two times a day (BID) | ORAL | 2 refills | Status: DC

## 2018-12-17 MED ORDER — METOPROLOL SUCCINATE ER 25 MG PO TB24: 25.0000 mg | ORAL_TABLET | Freq: Every day | ORAL | 1 refills | Status: DC

## 2018-12-17 MED FILL — METOPROLOL SUCCINATE ER 25: 25 | 30 days supply | Qty: 30 | Fill #0

## 2018-12-21 MED FILL — ELIQUIS 5 MG TABLET: 5 | 30 days supply | Qty: 60 | Fill #0

## 2018-12-28 ENCOUNTER — Other Ambulatory Visit: Payer: Self-pay | Admitting: Family Medicine

## 2019-01-15 ENCOUNTER — Telehealth (INDEPENDENT_AMBULATORY_CARE_PROVIDER_SITE_OTHER): Payer: 59 | Admitting: Cardiology

## 2019-01-15 ENCOUNTER — Other Ambulatory Visit: Payer: Self-pay

## 2019-01-15 ENCOUNTER — Encounter: Payer: Self-pay | Admitting: Cardiology

## 2019-01-15 VITALS — BP 128/74 | Wt 198.0 lb

## 2019-01-15 DIAGNOSIS — I480 Paroxysmal atrial fibrillation: Secondary | ICD-10-CM | POA: Diagnosis not present

## 2019-01-15 DIAGNOSIS — I10 Essential (primary) hypertension: Secondary | ICD-10-CM

## 2019-01-15 DIAGNOSIS — E782 Mixed hyperlipidemia: Secondary | ICD-10-CM

## 2019-01-15 NOTE — Patient Instructions (Signed)
Medication Instructions:  Your physician recommends that you continue on your current medications as directed. Please refer to the Current Medication list given to you today.  If you need a refill on your cardiac medications before your next appointment, please call your pharmacy.   Lab work: None ordered  If you have labs (blood work) drawn today and your tests are completely normal, you will receive your results only by: Marland Kitchen MyChart Message (if you have MyChart) OR . A paper copy in the mail If you have any lab test that is abnormal or we need to change your treatment, we will call you to review the results.  Testing/Procedures: None ordered  Follow-Up: At [facility], you and your health needs are our priority.  As part of our continuing mission to provide you with exceptional heart care, we have created designated Provider Care Teams.  These Care Teams include your primary Cardiologist (physician) and Advanced Practice Providers (APPs -  Physician Assistants and Nurse Practitioners) who all work together to provide you with the care you need, when you need it. You will need a follow up appointment in 5 months.  You may see  Jenne Campus or another member of our [employer] Provider Team in [place]: Shirlee More, MD . Jyl Heinz, MD

## 2019-01-15 NOTE — Progress Notes (Signed)
Virtual Visit via Video Note   This visit type was conducted due to national recommendations for restrictions regarding the COVID-19 Pandemic (e.g. social distancing) in an effort to limit this patient's exposure and mitigate transmission in our community.  Due to his co-morbid illnesses, this patient is at least at moderate risk for complications without adequate follow up.  This format is felt to be most appropriate for this patient at this time.  All issues noted in this document were discussed and addressed.  A limited physical exam was performed with this format.  Please refer to the patient's chart for his consent to telehealth for [facility].  Evaluation Performed:  Follow-up visit  This visit type was conducted due to national recommendations for restrictions regarding the COVID-19 Pandemic (e.g. social distancing).  This format is felt to be most appropriate for this patient at this time.  All issues noted in this document were discussed and addressed.  No physical exam was performed (except for noted visual exam findings with Video Visits).  Please refer to the patient's chart (MyChart message for video visits and phone note for telephone visits) for the patient's consent to telehealth for [facility].  Date:  01/15/2019  ID: [name], DOB 1951/02/12, MRN [id number]   Patient Location: [address] [place] [address]   Provider location:   [place] Office  PCP:  [name], DO  Cardiologist:  Jenne Campus, MD     Chief Complaint: Doing very well  History of Present Illness:    Darius Mitchell is a 68 y.o. male  who presents via audio/video conferencing for a telehealth visit today.  With paroxysmal atrial fibrillation, chads 2 Vascor equals 2.  He is anticoagulated which I will continue.  Overall he is doing very well.  Denies having any chest pain tightness squeezing pressure burning chest.  He is very busy working as well as  exercising have no difficulty doing it denies having any palpitations   The patient does not have symptoms concerning for COVID-19 infection (fever, chills, cough, or new SHORTNESS OF BREATH).    Prior CV studies:   The following studies were reviewed today:       Past Medical History:  Diagnosis Date  . Anxiety   . Appendicitis   . GERD (gastroesophageal reflux disease)   . Hyperlipidemia   . Hypertension   . Hypothyroidism     Past Surgical History:  Procedure Laterality Date  . APPENDECTOMY  1971  . WISDOM TOOTH EXTRACTION  1972     Current Meds  Medication Sig  . amLODipine (NORVASC) 5 MG tablet TAKE 1 TABLET BY MOUTH  DAILY  . apixaban (ELIQUIS) 5 MG TABS tablet Take 1 tablet (5 mg total) by mouth 2 (two) times daily for 30 days. (Patient taking differently: Take 5 mg by mouth daily. )  . atorvastatin (LIPITOR) 80 MG tablet TAKE 1 TABLET BY MOUTH  DAILY  . levothyroxine (SYNTHROID, LEVOTHROID) 112 MCG tablet TAKE 1 TABLET BY MOUTH  DAILY BEFORE BREAKFAST  . losartan (COZAAR) 100 MG tablet TAKE 1 TABLET BY MOUTH  DAILY  . metoprolol succinate (TOPROL-XL) 25 MG 24 hr tablet Take 1 tablet (25 mg total) by mouth daily.  . Multiple Vitamins-Minerals (CENTRUM SILVER ULTRA MENS) TABS Take by mouth daily.  Marland Kitchen omeprazole (PRILOSEC) 40 MG capsule TAKE 1 CAPSULE BY MOUTH  DAILY  . PARoxetine (PAXIL) 30 MG tablet TAKE 1 TABLET BY MOUTH ONCE DAILY  . sildenafil (  VIAGRA) 100 MG tablet Take 0.5-1 tablets (50-100 mg total) by mouth daily as needed for erectile dysfunction.      Family History: The patient's family history includes Healthy in his brother, daughter, and sister; Heart attack in his mother; Heart disease in his father; Hypertension in his daughter; Lymphoma (age of onset: 81) in his father; Stomach cancer in his paternal grandfather; Stroke in his maternal grandmother; Stroke (age of onset: 11) in his mother.   ROS:   Please see the history of present illness.      All other systems reviewed and are negative.   Labs/Other Tests and Data Reviewed:     Recent Labs: 06/18/2018: TSH 2.72 11/23/2018: BUN 20; Creatinine, Ser 1.16; Hemoglobin 14.1; Magnesium 2.0; Platelets 149; Potassium 3.1; Sodium 138  Recent Lipid Panel    Component Value Date/Time   CHOL 183 05/24/2017 0803   TRIG 197.0 (H) 05/24/2017 0803   HDL 39.80 05/24/2017 0803   CHOLHDL 5 05/24/2017 0803   VLDL 39.4 05/24/2017 0803   LDLCALC 104 (H) 05/24/2017 0803      Exam:    Vital Signs:  BP 128/74   Wt 198 lb (89.8 kg)   BMI 27.62 kg/m     Wt Readings from Last 3 Encounters:  01/15/19 198 lb (89.8 kg)  11/23/18 200 lb (90.7 kg)  06/18/18 212 lb (96.2 kg)     Well nourished, well developed in no acute distress. I am talking to him over the video link he is sitting in his office in office in [place].  He denies having any problems right now.  Diagnosis for this visit:   1. Paroxysmal atrial fibrillation (HCC)   2. Essential hypertension, benign   3. Mixed hyperlipidemia      ASSESSMENT & PLAN:    1.  Paroxysmal atrial fibrillation doing well from that point review we will continue present management.  He is anticoagulated which I will continue he was taking Eliquis only once daily we spent at least 5 minutes talking about the importance of taking this medication twice daily which we will do 2.  Essential hypertension blood pressure well controlled 3.  Mixed dyslipidemia we will continue present management  COVID-19 Education: The signs and symptoms of COVID-19 were discussed with the patient and how to seek care for testing (follow up with PCP or arrange E-visit).  The importance of social distancing was discussed today.  Patient Risk:   After full review of this patients clinical status, I feel that they are at least moderate risk at this time.  Time:   Today, I have spent 15 minutes with the patient with telehealth technology discussing pt health issues.  I  spent 5 minutes reviewing her chart before the visit.  Visit was finished at 4:40 PM.    Medication Adjustments/Labs and Tests Ordered: Current medicines are reviewed at length with the patient today.  Concerns regarding medicines are outlined above.  No orders of the defined types were placed in this encounter.  Medication changes: No orders of the defined types were placed in this encounter.    Disposition: Follow-up in 5 months  Signed, Park Liter, MD, [facility] 01/15/2019 4:41 PM    [place]

## 2019-01-17 ENCOUNTER — Telehealth: Payer: 59 | Admitting: Cardiology

## 2019-01-18 MED FILL — METOPROLOL SUCCINATE ER 25: 25 | 30 days supply | Qty: 30 | Fill #1

## 2019-02-04 MED FILL — ELIQUIS 5 MG TABLET: 5 | 30 days supply | Qty: 60 | Fill #1

## 2019-02-15 ENCOUNTER — Other Ambulatory Visit: Payer: Self-pay | Admitting: Family Medicine

## 2019-02-15 DIAGNOSIS — E039 Hypothyroidism, unspecified: Secondary | ICD-10-CM

## 2019-02-15 MED FILL — METOPROLOL SUCCINATE ER 25: 25 | 30 days supply | Qty: 30 | Fill #2

## 2019-02-21 ENCOUNTER — Other Ambulatory Visit: Payer: Self-pay | Admitting: Family Medicine

## 2019-02-21 DIAGNOSIS — F329 Major depressive disorder, single episode, unspecified: Secondary | ICD-10-CM

## 2019-02-21 DIAGNOSIS — F32A Depression, unspecified: Secondary | ICD-10-CM

## 2019-02-21 DIAGNOSIS — F419 Anxiety disorder, unspecified: Secondary | ICD-10-CM

## 2019-03-03 ENCOUNTER — Other Ambulatory Visit: Payer: Self-pay | Admitting: Family Medicine

## 2019-03-15 ENCOUNTER — Other Ambulatory Visit: Payer: Self-pay | Admitting: Family Medicine

## 2019-03-19 MED FILL — METOPROLOL SUCCINATE ER 25: 25 | 30 days supply | Qty: 30 | Fill #3

## 2019-03-22 ENCOUNTER — Ambulatory Visit (INDEPENDENT_AMBULATORY_CARE_PROVIDER_SITE_OTHER): Payer: 59

## 2019-03-22 ENCOUNTER — Other Ambulatory Visit: Payer: Self-pay

## 2019-03-22 ENCOUNTER — Ambulatory Visit (INDEPENDENT_AMBULATORY_CARE_PROVIDER_SITE_OTHER): Payer: 59 | Admitting: Podiatry

## 2019-03-22 ENCOUNTER — Encounter: Payer: Self-pay | Admitting: Podiatry

## 2019-03-22 DIAGNOSIS — M778 Other enthesopathies, not elsewhere classified: Secondary | ICD-10-CM

## 2019-03-22 DIAGNOSIS — G5762 Lesion of plantar nerve, left lower limb: Secondary | ICD-10-CM

## 2019-03-22 DIAGNOSIS — M79672 Pain in left foot: Secondary | ICD-10-CM | POA: Diagnosis not present

## 2019-03-22 DIAGNOSIS — M6740 Ganglion, unspecified site: Secondary | ICD-10-CM

## 2019-03-22 DIAGNOSIS — M7752 Other enthesopathy of left foot: Secondary | ICD-10-CM | POA: Diagnosis not present

## 2019-03-24 ENCOUNTER — Encounter: Payer: Self-pay | Admitting: Family Medicine

## 2019-03-24 ENCOUNTER — Other Ambulatory Visit: Payer: Self-pay | Admitting: Family Medicine

## 2019-03-25 NOTE — Progress Notes (Signed)
Subjective:  Patient ID: Darius Mitchell, male    DOB: [birth year],  MRN: [id number]  Chief Complaint  Patient presents with  . Foot Pain    Dorsal forefoot left (1st interspace) - sharp, shooting pains x 2 weeks, active in golf, no injury, lots of pain when walking  . Toe Pain    2nd toe left - lateral aspect - lesion, removed by PCP but came back, has been using wart meds    68 y.o. male presents with the above complaint. Hx confirmed with patient. There is also secondary complaints of left second mucoid cyst. Stable in nature. Patient states it doesn't bother him.   Review of Systems: Negative except as noted in the HPI. Denies N/V/F/Ch.  Past Medical History:  Diagnosis Date  . Anxiety   . Appendicitis   . GERD (gastroesophageal reflux disease)   . Hyperlipidemia   . Hypertension   . Hypothyroidism     Current Outpatient Medications:  .  amLODipine (NORVASC) 5 MG tablet, TAKE 1 TABLET BY MOUTH  DAILY, Disp: 90 tablet, Rfl: 3 .  apixaban (ELIQUIS) 5 MG TABS tablet, Take 1 tablet (5 mg total) by mouth 2 (two) times daily for 30 days. (Patient taking differently: Take 5 mg by mouth daily. ), Disp: 60 tablet, Rfl: 2 .  atorvastatin (LIPITOR) 80 MG tablet, TAKE 1 TABLET BY MOUTH  DAILY, Disp: 90 tablet, Rfl: 3 .  levothyroxine (SYNTHROID) 112 MCG tablet, TAKE 1 TABLET BY MOUTH  DAILY BEFORE BREAKFAST, Disp: 90 tablet, Rfl: 3 .  losartan (COZAAR) 100 MG tablet, TAKE 1 TABLET BY MOUTH  DAILY, Disp: 90 tablet, Rfl: 3 .  metoprolol succinate (TOPROL-XL) 25 MG 24 hr tablet, Take 1 tablet (25 mg total) by mouth daily., Disp: 90 tablet, Rfl: 1 .  Multiple Vitamins-Minerals (CENTRUM SILVER ULTRA MENS) TABS, Take by mouth daily., Disp: , Rfl:  .  omeprazole (PRILOSEC) 40 MG capsule, TAKE 1 CAPSULE BY MOUTH  DAILY, Disp: 90 capsule, Rfl: 3 .  PARoxetine (PAXIL) 30 MG tablet, TAKE 1 TABLET BY MOUTH ONCE DAILY, Disp: 90 tablet, Rfl: 3 .  sildenafil (VIAGRA) 100 MG tablet, Take 0.5-1 tablets  (50-100 mg total) by mouth daily as needed for erectile dysfunction., Disp: 10 tablet, Rfl: 0  Social History   Tobacco Use  Smoking Status Current Some Day Smoker  . Packs/day: 0.25  . Years: 25.00  . Pack years: 6.25  . Types: Cigarettes  . Last attempt to quit: 06/28/1979  . Years since quitting: 39.7  Smokeless Tobacco Never Used  Tobacco Comment   Only when playing golf    Allergies  Allergen Reactions  . Codeine Nausea Only  . Niacin And Related    Objective:  There were no vitals filed for this visit. There is no height or weight on file to calculate BMI. Constitutional no acute distress, alert/oriented x3 and appropriate mood and affect  Vascular Dorsalis pedis pulse: present Posterior tibial pulse: present and  Hair pattern: normal Warmth: no warmth  Neurologic Epicritic sensations intact. Palpable click noted on the first interspace. Negative tinel signs. Pain illicited  on squeezing all toes.   Dermatologic Left second digit cyst noted stable. No discoloration noted.0.1 cm x 0.1 cm  Orthopedic: Swelling: none Tenderness: mild     Radiographs: Posterior heel spur noted.  There is a bunion deformity of the left foot noted.  There is increasing soft tissue density around the medial eminence of the first MPJ.  No other bony deformities  noted. Assessment:   1. Morton's neuroma of left foot   2. Pain in left foot   3. Mucoid cyst, joint    Plan:  Patient was evaluated and treated and all questions answered.  L Neuroma of 1st interspace --Injection was given for nerve block if patient symptoms improve will start the therapy for sclerosing injection to treat neuroma.  -- Metatarsal pad was dispensed to alleviate the plantar pressure.   L mucoid cyst of Left second digit    - Stable in nature, patient has history of prior removal. However, the cyst recurred. Pt denies any acute complaint of the lesion.     - Will hold off on any intervention for now and monitor.      Return in about 2 weeks (around 04/05/2019).

## 2019-04-05 ENCOUNTER — Ambulatory Visit: Payer: 59 | Admitting: Podiatry

## 2019-04-09 ENCOUNTER — Encounter: Payer: Self-pay | Admitting: Podiatry

## 2019-04-09 ENCOUNTER — Other Ambulatory Visit: Payer: Self-pay

## 2019-04-09 ENCOUNTER — Ambulatory Visit (INDEPENDENT_AMBULATORY_CARE_PROVIDER_SITE_OTHER): Payer: 59 | Admitting: Podiatry

## 2019-04-09 DIAGNOSIS — M79672 Pain in left foot: Secondary | ICD-10-CM | POA: Diagnosis not present

## 2019-04-09 DIAGNOSIS — G5762 Lesion of plantar nerve, left lower limb: Secondary | ICD-10-CM

## 2019-04-09 NOTE — Progress Notes (Signed)
Subjective:  Patient ID: Darius Mitchell, male    DOB: [birth year],  MRN: [id number]  Chief Complaint  Patient presents with  . Neuroma    Pt states injection helped with left foot neuroma temporarily but wore off.   . Cyst    Pt states the cyst has returned but currently does not bother him.    68 y.o. male presents with the above complaint.  Patient states that the injection that was given in the first interspace for the neuroma had helped for last couple of days.  That was actually my intentions because it was a diagnostic block to rule in first interspace neuroma.  I explained to the patient different treatment options including surgical he elected to have dehydrate alcohol injections.  He denies any other complaints.  He also states that his is mucoid cyst the pain has improved however the cyst is still present.  He states that it does not bother him and he does not want to get it treated.  Review of Systems: Negative except as noted in the HPI. Denies N/V/F/Ch.  Past Medical History:  Diagnosis Date  . Anxiety   . Appendicitis   . GERD (gastroesophageal reflux disease)   . Hyperlipidemia   . Hypertension   . Hypothyroidism     Current Outpatient Medications:  .  amLODipine (NORVASC) 5 MG tablet, TAKE 1 TABLET BY MOUTH  DAILY, Disp: 90 tablet, Rfl: 3 .  atorvastatin (LIPITOR) 80 MG tablet, TAKE 1 TABLET BY MOUTH  DAILY, Disp: 90 tablet, Rfl: 3 .  levothyroxine (SYNTHROID) 112 MCG tablet, TAKE 1 TABLET BY MOUTH  DAILY BEFORE BREAKFAST, Disp: 90 tablet, Rfl: 3 .  losartan (COZAAR) 100 MG tablet, TAKE 1 TABLET BY MOUTH  DAILY, Disp: 90 tablet, Rfl: 3 .  metoprolol succinate (TOPROL-XL) 25 MG 24 hr tablet, Take 1 tablet (25 mg total) by mouth daily., Disp: 90 tablet, Rfl: 1 .  Multiple Vitamins-Minerals (CENTRUM SILVER ULTRA MENS) TABS, Take by mouth daily., Disp: , Rfl:  .  omeprazole (PRILOSEC) 40 MG capsule, TAKE 1 CAPSULE BY MOUTH  DAILY, Disp: 90 capsule, Rfl: 3 .  PARoxetine  (PAXIL) 30 MG tablet, TAKE 1 TABLET BY MOUTH ONCE DAILY, Disp: 90 tablet, Rfl: 3 .  sildenafil (VIAGRA) 100 MG tablet, Take 0.5-1 tablets (50-100 mg total) by mouth daily as needed for erectile dysfunction., Disp: 10 tablet, Rfl: 0 .  apixaban (ELIQUIS) 5 MG TABS tablet, Take 1 tablet (5 mg total) by mouth 2 (two) times daily for 30 days. (Patient taking differently: Take 5 mg by mouth daily. ), Disp: 60 tablet, Rfl: 2  Social History   Tobacco Use  Smoking Status Current Some Day Smoker  . Packs/day: 0.25  . Years: 25.00  . Pack years: 6.25  . Types: Cigarettes  . Last attempt to quit: 06/28/1979  . Years since quitting: 39.8  Smokeless Tobacco Never Used  Tobacco Comment   Only when playing golf    Allergies  Allergen Reactions  . Codeine Nausea Only  . Niacin And Related    Objective:  There were no vitals filed for this visit. There is no height or weight on file to calculate BMI. Constitutional Well developed. Well nourished.  Vascular Dorsalis pedis pulses palpable bilaterally. Posterior tibial pulses palpable bilaterally. Capillary refill normal to all digits.  No cyanosis or clubbing noted. Pedal hair growth normal.  Neurologic Normal speech. Oriented to person, place, and time. Tingling shooting pain noted in between the first and second  toe with positive Mulder click  Dermatologic Nails well groomed and normal in appearance. No open wounds. No skin lesions.  Orthopedic:  Positive Mulder click in the first interspace pain on lateral squeeze test of the left foot.   Radiographs: None Assessment:   1. Morton's neuroma of left foot   2. Pain in left foot    Plan:  Patient was evaluated and treated and all questions answered.  Left first interspace neuroma -Patient elected to have dehydrated alcohol injections to kill the nerve root that is causing the pain.  I explained to the patient that this can recur and can cause further pain later in life.  There is also  no guarantees as to the outcome of the alcohol injections.  I also gave the patient an option for surgical removal of the neuroma which patient declined and would like to try the dehydrated alcohol injections first. -Today will be his first 2 cc of dehydrated alcohol for neurolysis injected in the first interspace of the left foot in standard manner.  No complications noted.  Return in about 3 weeks (around 04/30/2019).

## 2019-04-15 MED FILL — METOPROLOL SUCCINATE ER 25: 25 | 30 days supply | Qty: 30 | Fill #4

## 2019-05-01 ENCOUNTER — Ambulatory Visit (INDEPENDENT_AMBULATORY_CARE_PROVIDER_SITE_OTHER): Payer: 59 | Admitting: Podiatry

## 2019-05-01 ENCOUNTER — Encounter: Payer: Self-pay | Admitting: Podiatry

## 2019-05-01 ENCOUNTER — Other Ambulatory Visit: Payer: Self-pay

## 2019-05-01 DIAGNOSIS — M79672 Pain in left foot: Secondary | ICD-10-CM | POA: Diagnosis not present

## 2019-05-01 DIAGNOSIS — G5762 Lesion of plantar nerve, left lower limb: Secondary | ICD-10-CM | POA: Diagnosis not present

## 2019-05-01 NOTE — Progress Notes (Signed)
Subjective:  Patient ID: Darius Mitchell, male    DOB: 23-May-1951,  MRN: [id number]  No chief complaint on file.   68 y.o. male presents with the above complaint.  Patient is here for his neuroma injection but dehydrated alcohol.  He denies any other acute complaints he states that this has been helping a little bit.  This will be his second injection. His pain has improved.   Review of Systems: Negative except as noted in the HPI. Denies N/V/F/Ch.  Past Medical History:  Diagnosis Date  . Anxiety   . Appendicitis   . GERD (gastroesophageal reflux disease)   . Hyperlipidemia   . Hypertension   . Hypothyroidism     Current Outpatient Medications:  .  amLODipine (NORVASC) 5 MG tablet, TAKE 1 TABLET BY MOUTH  DAILY, Disp: 90 tablet, Rfl: 3 .  apixaban (ELIQUIS) 5 MG TABS tablet, Take 1 tablet (5 mg total) by mouth 2 (two) times daily for 30 days. (Patient taking differently: Take 5 mg by mouth daily. ), Disp: 60 tablet, Rfl: 2 .  atorvastatin (LIPITOR) 80 MG tablet, TAKE 1 TABLET BY MOUTH  DAILY, Disp: 90 tablet, Rfl: 3 .  levothyroxine (SYNTHROID) 112 MCG tablet, TAKE 1 TABLET BY MOUTH  DAILY BEFORE BREAKFAST, Disp: 90 tablet, Rfl: 3 .  losartan (COZAAR) 100 MG tablet, TAKE 1 TABLET BY MOUTH  DAILY, Disp: 90 tablet, Rfl: 3 .  metoprolol succinate (TOPROL-XL) 25 MG 24 hr tablet, Take 1 tablet (25 mg total) by mouth daily., Disp: 90 tablet, Rfl: 1 .  Multiple Vitamins-Minerals (CENTRUM SILVER ULTRA MENS) TABS, Take by mouth daily., Disp: , Rfl:  .  omeprazole (PRILOSEC) 40 MG capsule, TAKE 1 CAPSULE BY MOUTH  DAILY, Disp: 90 capsule, Rfl: 3 .  PARoxetine (PAXIL) 30 MG tablet, TAKE 1 TABLET BY MOUTH ONCE DAILY, Disp: 90 tablet, Rfl: 3 .  sildenafil (VIAGRA) 100 MG tablet, Take 0.5-1 tablets (50-100 mg total) by mouth daily as needed for erectile dysfunction., Disp: 10 tablet, Rfl: 0  Social History   Tobacco Use  Smoking Status Current Some Day Smoker  . Packs/day: 0.25  . Years: 25.00   . Pack years: 6.25  . Types: Cigarettes  . Last attempt to quit: 06/28/1979  . Years since quitting: 39.8  Smokeless Tobacco Never Used  Tobacco Comment   Only when playing golf    Allergies  Allergen Reactions  . Codeine Nausea Only  . Niacin And Related    Objective:  There were no vitals filed for this visit. There is no height or weight on file to calculate BMI. Constitutional Well developed. Well nourished.  Vascular Dorsalis pedis pulses palpable bilaterally. Posterior tibial pulses palpable bilaterally. Capillary refill normal to all digits.  No cyanosis or clubbing noted. Pedal hair growth normal.  Neurologic Normal speech. Oriented to person, place, and time. Epicritic sensation to light touch grossly present bilaterally.  Dermatologic Nails well groomed and normal in appearance. No open wounds. No skin lesions.  Orthopedic: Positive Mulder click in the first interspace pain on lateral squeeze test of the left foot.   Radiographs: None Assessment:   1. Pain in left foot   2. Morton's neuroma of left foot    Plan:  Patient was evaluated and treated and all questions answered.  Left first interspace neuroma -Today will be his second 2 cc of dehydrated alcohol for neurolysis injected in the first interspace of the left foot in standard manner.  No complications noted. -  No  follow-ups on file.

## 2019-05-02 ENCOUNTER — Encounter: Payer: Self-pay | Admitting: Podiatry

## 2019-05-09 MED FILL — ELIQUIS 5 MG TABLET: 5 | 30 days supply | Qty: 60 | Fill #2

## 2019-05-13 MED FILL — METOPROLOL SUCCINATE ER 25: 25 | 30 days supply | Qty: 30 | Fill #5

## 2019-05-16 ENCOUNTER — Other Ambulatory Visit: Payer: Self-pay | Admitting: Family Medicine

## 2019-05-22 ENCOUNTER — Ambulatory Visit (INDEPENDENT_AMBULATORY_CARE_PROVIDER_SITE_OTHER): Payer: 59 | Admitting: Podiatry

## 2019-05-22 ENCOUNTER — Other Ambulatory Visit: Payer: Self-pay

## 2019-05-22 DIAGNOSIS — M79672 Pain in left foot: Secondary | ICD-10-CM

## 2019-05-22 DIAGNOSIS — G5762 Lesion of plantar nerve, left lower limb: Secondary | ICD-10-CM | POA: Diagnosis not present

## 2019-05-25 ENCOUNTER — Encounter: Payer: Self-pay | Admitting: Podiatry

## 2019-05-25 NOTE — Progress Notes (Signed)
Subjective:  Patient ID: Darius Mitchell, male    DOB: 1951-06-13,  MRN: [id number]  Chief Complaint  Patient presents with  . Foot Pain    pt is here for 3 weeks foot pain, pt states that his pain is a whole lot better on the left foot, pt also states that the injection he recieved has somewhat helped as well    68 y.o. male presents with the above complaint.  Patient is here for his neuroma injection but dehydrated alcohol.  He denies any other acute complaints he states that this has been helping a little bit.  This will be his third injection. His pain has improved.   Review of Systems: Negative except as noted in the HPI. Denies N/V/F/Ch.  Past Medical History:  Diagnosis Date  . Anxiety   . Appendicitis   . GERD (gastroesophageal reflux disease)   . Hyperlipidemia   . Hypertension   . Hypothyroidism     Current Outpatient Medications:  .  amLODipine (NORVASC) 5 MG tablet, TAKE 1 TABLET BY MOUTH  DAILY, Disp: 90 tablet, Rfl: 3 .  apixaban (ELIQUIS) 5 MG TABS tablet, Take 1 tablet (5 mg total) by mouth 2 (two) times daily for 30 days. (Patient taking differently: Take 5 mg by mouth daily. ), Disp: 60 tablet, Rfl: 2 .  atorvastatin (LIPITOR) 80 MG tablet, TAKE 1 TABLET BY MOUTH  DAILY, Disp: 90 tablet, Rfl: 3 .  levothyroxine (SYNTHROID) 112 MCG tablet, TAKE 1 TABLET BY MOUTH  DAILY BEFORE BREAKFAST, Disp: 90 tablet, Rfl: 3 .  losartan (COZAAR) 100 MG tablet, TAKE 1 TABLET BY MOUTH  DAILY, Disp: 90 tablet, Rfl: 3 .  metoprolol succinate (TOPROL-XL) 25 MG 24 hr tablet, Take 1 tablet (25 mg total) by mouth daily., Disp: 90 tablet, Rfl: 1 .  Multiple Vitamins-Minerals (CENTRUM SILVER ULTRA MENS) TABS, Take by mouth daily., Disp: , Rfl:  .  omeprazole (PRILOSEC) 40 MG capsule, TAKE 1 CAPSULE BY MOUTH  DAILY, Disp: 90 capsule, Rfl: 3 .  PARoxetine (PAXIL) 30 MG tablet, TAKE 1 TABLET BY MOUTH ONCE DAILY, Disp: 90 tablet, Rfl: 3 .  sildenafil (VIAGRA) 100 MG tablet, Take 0.5-1 tablets  (50-100 mg total) by mouth daily as needed for erectile dysfunction., Disp: 10 tablet, Rfl: 0  Social History   Tobacco Use  Smoking Status Current Some Day Smoker  . Packs/day: 0.25  . Years: 25.00  . Pack years: 6.25  . Types: Cigarettes  . Last attempt to quit: 06/28/1979  . Years since quitting: 39.9  Smokeless Tobacco Never Used  Tobacco Comment   Only when playing golf    Allergies  Allergen Reactions  . Codeine Nausea Only  . Niacin And Related    Objective:  There were no vitals filed for this visit. There is no height or weight on file to calculate BMI. Constitutional Well developed. Well nourished.  Vascular Dorsalis pedis pulses palpable bilaterally. Posterior tibial pulses palpable bilaterally. Capillary refill normal to all digits.  No cyanosis or clubbing noted. Pedal hair growth normal.  Neurologic Normal speech. Oriented to person, place, and time. Epicritic sensation to light touch grossly present bilaterally.  Dermatologic Nails well groomed and normal in appearance. No open wounds. No skin lesions.  Orthopedic: Positive Mulder click in the first interspace pain on lateral squeeze test of the left foot.  The clinic is slightly improving.   Radiographs: None Assessment:   1. Morton's neuroma of left foot   2. Pain in left foot  Plan:  Patient was evaluated and treated and all questions answered.  Left first interspace neuroma -Today will be his Third 2 cc of dehydrated alcohol for neurolysis injected in the first interspace of the left foot in standard manner.  No complications noted. -Given the patient's pain is improving over the course of these injections, I will continue to do them until we achieve complete destruction of the peripheral nerve.  No follow-ups on file.

## 2019-06-06 ENCOUNTER — Emergency Department (HOSPITAL_BASED_OUTPATIENT_CLINIC_OR_DEPARTMENT_OTHER)
Admission: EM | Admit: 2019-06-06 | Discharge: 2019-06-06 | Disposition: A | Discharge status: Home / Self Care | Payer: 59 | Attending: Emergency Medicine | Admitting: Emergency Medicine

## 2019-06-06 ENCOUNTER — Encounter (HOSPITAL_BASED_OUTPATIENT_CLINIC_OR_DEPARTMENT_OTHER): Payer: Self-pay | Admitting: *Deleted

## 2019-06-06 ENCOUNTER — Other Ambulatory Visit: Payer: Self-pay

## 2019-06-06 ENCOUNTER — Telehealth: Payer: Self-pay | Admitting: Cardiology

## 2019-06-06 DIAGNOSIS — Z885 Allergy status to narcotic agent status: Secondary | ICD-10-CM | POA: Insufficient documentation

## 2019-06-06 DIAGNOSIS — E039 Hypothyroidism, unspecified: Secondary | ICD-10-CM | POA: Diagnosis not present

## 2019-06-06 DIAGNOSIS — Z888 Allergy status to other drugs, medicaments and biological substances status: Secondary | ICD-10-CM | POA: Diagnosis not present

## 2019-06-06 DIAGNOSIS — I10 Essential (primary) hypertension: Secondary | ICD-10-CM | POA: Diagnosis not present

## 2019-06-06 DIAGNOSIS — Z7901 Long term (current) use of anticoagulants: Secondary | ICD-10-CM | POA: Insufficient documentation

## 2019-06-06 DIAGNOSIS — E785 Hyperlipidemia, unspecified: Secondary | ICD-10-CM | POA: Diagnosis not present

## 2019-06-06 DIAGNOSIS — F17210 Nicotine dependence, cigarettes, uncomplicated: Secondary | ICD-10-CM | POA: Diagnosis not present

## 2019-06-06 DIAGNOSIS — Z79899 Other long term (current) drug therapy: Secondary | ICD-10-CM | POA: Insufficient documentation

## 2019-06-06 NOTE — ED Provider Notes (Signed)
[facility] EMERGENCY DEPARTMENT Provider Note   CSN: [id number] Arrival date & time: 06/06/19  1313     History Chief Complaint  Patient presents with  . Hypertension    Darius Mitchell is a 68 y.o. male.  68yo male with history of HTN and a-fib, on Eliquis, taking medications as prescribed. BP at work 188/110, felt flushed and took a few aspirin. BP remained elevated and continued to feel flushed, PCP not in the office and was advised to come to the ER. Taking  Cozaar, Metoprolol (discontinue Norvasc in October due to leg discoloration).  Last took Viagra- unknown, not taking. Feels fine at this time and suspects BP is back to normal.  Denies CP, SHOB, palpitations, headache.         Past Medical History:  Diagnosis Date  . Anxiety   . Appendicitis   . GERD (gastroesophageal reflux disease)   . Hyperlipidemia   . Hypertension   . Hypothyroidism     Patient Active Problem List   Diagnosis Date Noted  . Paroxysmal atrial fibrillation ([place]) 12/03/2018  . Swelling of both lower extremities 11/29/2017  . Diverticulosis of colon without hemorrhage 03/17/2016  . Carpal tunnel syndrome of right wrist 10/23/2015  . Low testosterone 05/15/2015  . BMI 30.0-30.9,adult 05/15/2015  . Screening for ischemic heart disease 04/30/2014  . Visit for preventive health examination 04/30/2014  . Prostate cancer screening encounter, options and risks discussed 04/30/2014  . Essential hypertension, benign 03/25/2014  . Hyperlipidemia 03/25/2014  . GERD (gastroesophageal reflux disease) 03/25/2014  . Anxiety and depression 03/25/2014  . Hypothyroidism 03/25/2014    Past Surgical History:  Procedure Laterality Date  . APPENDECTOMY  1971  . WISDOM TOOTH EXTRACTION  1972       Family History  Problem Relation Age of Onset  . Stroke Mother 77       Deceased  . Heart attack Mother   . Lymphoma Father 48       Deceased  . Heart disease Father   . Stroke Maternal  Grandmother   . Stomach cancer Paternal Grandfather   . Healthy Brother        x2  . Healthy Sister   . Hypertension Daughter   . Healthy Daughter     Social History   Tobacco Use  . Smoking status: Current Some Day Smoker    Packs/day: 0.25    Years: 25.00    Pack years: 6.25    Types: Cigarettes    Last attempt to quit: 06/28/1979    Years since quitting: 39.9  . Smokeless tobacco: Never Used  . Tobacco comment: Only when playing golf  Substance Use Topics  . Alcohol use: Never  . Drug use: Never    Home Medications Prior to Admission medications   Medication Sig Start Date End Date Taking? Authorizing Provider  amLODipine (NORVASC) 5 MG tablet TAKE 1 TABLET BY MOUTH  DAILY 05/17/19   Wendling, Crosby Oyster, DO  apixaban (ELIQUIS) 5 MG TABS tablet Take 1 tablet (5 mg total) by mouth 2 (two) times daily for 30 days. Patient taking differently: Take 5 mg by mouth daily.  12/17/18 01/16/19  Park Liter, MD  atorvastatin (LIPITOR) 80 MG tablet TAKE 1 TABLET BY MOUTH  DAILY 03/18/19   [name], DO  levothyroxine (SYNTHROID) 112 MCG tablet TAKE 1 TABLET BY MOUTH  DAILY BEFORE BREAKFAST 02/18/19   Wendling, Crosby Oyster, DO  losartan (COZAAR) 100 MG tablet TAKE 1 TABLET BY  MOUTH  DAILY 03/25/19   [name], DO  metoprolol succinate (TOPROL-XL) 25 MG 24 hr tablet Take 1 tablet (25 mg total) by mouth daily. 12/17/18   Park Liter, MD  Multiple Vitamins-Minerals (CENTRUM SILVER ULTRA MENS) TABS Take by mouth daily.    [provider]  omeprazole (PRILOSEC) 40 MG capsule TAKE 1 CAPSULE BY MOUTH  DAILY 03/05/19   [name], DO  PARoxetine (PAXIL) 30 MG tablet TAKE 1 TABLET BY MOUTH ONCE DAILY 02/22/19   [name], DO  sildenafil (VIAGRA) 100 MG tablet Take 0.5-1 tablets (50-100 mg total) by mouth daily as needed for erectile dysfunction. 10/09/18   [name], DO    Allergies    Codeine and Niacin  and related  Review of Systems   Review of Systems  Constitutional: Negative for chills, diaphoresis and fever.  Eyes: Negative for visual disturbance.  Respiratory: Negative for shortness of breath.   Cardiovascular: Negative for chest pain, palpitations and leg swelling.  Gastrointestinal: Negative for abdominal pain, nausea and vomiting.  Musculoskeletal: Negative for gait problem.  Skin: Negative for rash and wound.  Allergic/Immunologic: Negative for immunocompromised state.  Neurological: Negative for speech difficulty, weakness and headaches.  All other systems reviewed and are negative.   Physical Exam Updated Vital Signs BP (!) 192/94 (BP Location: Right Arm)   Pulse 61   Temp 98.5 F (36.9 C) (Oral)   Resp 18   Ht 5\' 11"  (1.803 m)   Wt 95.5 kg   SpO2 100%   BMI 29.36 kg/m   Physical Exam Vitals and nursing note reviewed.  Constitutional:      General: He is not in acute distress.    Appearance: He is well-developed. He is not diaphoretic.  HENT:     Head: Normocephalic and atraumatic.  Cardiovascular:     Rate and Rhythm: Regular rhythm. Bradycardia present.     Pulses: Normal pulses.     Heart sounds: Normal heart sounds.  Pulmonary:     Effort: Pulmonary effort is normal.     Breath sounds: Normal breath sounds.  Abdominal:     Palpations: Abdomen is soft.     Tenderness: There is no abdominal tenderness.  Musculoskeletal:     Right lower leg: No edema.     Left lower leg: No edema.  Skin:    General: Skin is warm and dry.     Findings: No erythema or rash.  Neurological:     Mental Status: He is alert and oriented to person, place, and time.  Psychiatric:        Behavior: Behavior normal.     ED Results / Procedures / Treatments   Labs (all labs ordered are listed, but only abnormal results are displayed) Labs Reviewed - No data to display  EKG EKG Interpretation  Date/Time:  Thursday June 06 2019 13:22:39 EST Ventricular Rate:   55 PR Interval:  186 QRS Duration: 102 QT Interval:  446 QTC Calculation: 426 R Axis:   21 Text Interpretation: Sinus bradycardia Otherwise normal ECG No significant change since last tracing Confirmed by Gareth Morgan [phone]) on 06/06/2019 2:48:14 PM   Radiology No results found.  Procedures Procedures (including critical care time)  Medications Ordered in ED Medications - No data to display  ED Course  I have reviewed the triage vital signs and the nursing notes.  Pertinent labs & imaging results that were available during my care of the patient were reviewed by  me and considered in my medical decision making (see chart for details).  Clinical Course as of Jun 05 1453  Thu Jun 05, 2241  3570 68 year old male with complaint of elevated blood pressure and feeling flushed at work today.  Symptoms have since completely resolved, denies ever experiencing chest pain, headache, visual disturbance, shortness of breath or palpitations.  Patient is taking his Eliquis and blood pressure medications as prescribed.  BP currently 160s over 90s.  Discussed with patient, continue to monitor blood pressure record readings for PCP follow-up.  Advised to rest today, drink plenty of hydrating fluids and avoid caffeine and tobacco products.  Patient to return to ER for any concerning symptoms will otherwise follow-up with PCP.   [LM]    Clinical Course User Index [LM] Darius Mitchell   MDM Rules/Calculators/A&P                        Final Clinical Impression(s) / ED Diagnoses Final diagnoses:  Hypertension, unspecified type    Rx / DC Orders ED Discharge Orders    None       Tacy Learn, PA-C 06/06/19 1454    Gareth Morgan, MD 06/06/19 2249

## 2019-06-06 NOTE — Telephone Encounter (Signed)
Left message for patient to return call.

## 2019-06-06 NOTE — Discharge Instructions (Addendum)
Monitor your blood pressure, record readings and report here provider. Home to rest today drink plenty of hydrating fluids. Limit caffeine intake, do not smoke.  Return to ER for worsening or concerning symptoms.

## 2019-06-06 NOTE — Telephone Encounter (Signed)
Pt. Requesting a call back she says that her husband blood pressure is up and she wants to know what she need sto do to bring it down

## 2019-06-06 NOTE — ED Triage Notes (Signed)
He became flushed and took his BP at work and it was elevated. Hx of atrial fib. He has not missed any BP medication. No symptoms.

## 2019-06-07 NOTE — Telephone Encounter (Signed)
Left message for patient to return call.

## 2019-06-14 ENCOUNTER — Ambulatory Visit (INDEPENDENT_AMBULATORY_CARE_PROVIDER_SITE_OTHER): Payer: 59 | Admitting: Podiatry

## 2019-06-14 ENCOUNTER — Other Ambulatory Visit: Payer: Self-pay

## 2019-06-14 ENCOUNTER — Encounter: Payer: Self-pay | Admitting: Podiatry

## 2019-06-14 DIAGNOSIS — G5762 Lesion of plantar nerve, left lower limb: Secondary | ICD-10-CM

## 2019-06-14 DIAGNOSIS — M79672 Pain in left foot: Secondary | ICD-10-CM

## 2019-06-14 NOTE — Progress Notes (Signed)
Subjective:  Patient ID: Darius Mitchell, male    DOB: December 10, 1950,  MRN: [id number]  Chief Complaint  Patient presents with  . Foot Pain    pt is here for mortons neuroma f/u of the left heel    68 y.o. male presents with the above complaint.  Patient is here for his neuroma injection with dehydrated alcohol.  He denies any other acute complaints he states that this has been helping a little bit.  This will be his third injection. His pain has improved.   Review of Systems: Negative except as noted in the HPI. Denies N/V/F/Ch.  Past Medical History:  Diagnosis Date  . Anxiety   . Appendicitis   . GERD (gastroesophageal reflux disease)   . Hyperlipidemia   . Hypertension   . Hypothyroidism     Current Outpatient Medications:  .  amLODipine (NORVASC) 5 MG tablet, TAKE 1 TABLET BY MOUTH  DAILY, Disp: 90 tablet, Rfl: 3 .  atorvastatin (LIPITOR) 80 MG tablet, TAKE 1 TABLET BY MOUTH  DAILY, Disp: 90 tablet, Rfl: 3 .  levothyroxine (SYNTHROID) 112 MCG tablet, TAKE 1 TABLET BY MOUTH  DAILY BEFORE BREAKFAST, Disp: 90 tablet, Rfl: 3 .  losartan (COZAAR) 100 MG tablet, TAKE 1 TABLET BY MOUTH  DAILY, Disp: 90 tablet, Rfl: 3 .  metoprolol succinate (TOPROL-XL) 25 MG 24 hr tablet, Take 1 tablet (25 mg total) by mouth daily., Disp: 90 tablet, Rfl: 1 .  Multiple Vitamins-Minerals (CENTRUM SILVER ULTRA MENS) TABS, Take by mouth daily., Disp: , Rfl:  .  omeprazole (PRILOSEC) 40 MG capsule, TAKE 1 CAPSULE BY MOUTH  DAILY, Disp: 90 capsule, Rfl: 3 .  PARoxetine (PAXIL) 30 MG tablet, TAKE 1 TABLET BY MOUTH ONCE DAILY, Disp: 90 tablet, Rfl: 3 .  sildenafil (VIAGRA) 100 MG tablet, Take 0.5-1 tablets (50-100 mg total) by mouth daily as needed for erectile dysfunction., Disp: 10 tablet, Rfl: 0 .  apixaban (ELIQUIS) 5 MG TABS tablet, Take 1 tablet (5 mg total) by mouth 2 (two) times daily for 30 days. (Patient taking differently: Take 5 mg by mouth daily. ), Disp: 60 tablet, Rfl: 2  Social History    Tobacco Use  Smoking Status Current Some Day Smoker  . Packs/day: 0.25  . Years: 25.00  . Pack years: 6.25  . Types: Cigarettes  . Last attempt to quit: 06/28/1979  . Years since quitting: 39.9  Smokeless Tobacco Never Used  Tobacco Comment   Only when playing golf    Allergies  Allergen Reactions  . Codeine Nausea Only  . Niacin And Related    Objective:  There were no vitals filed for this visit. There is no height or weight on file to calculate BMI. Constitutional Well developed. Well nourished.  Vascular Dorsalis pedis pulses palpable bilaterally. Posterior tibial pulses palpable bilaterally. Capillary refill normal to all digits.  No cyanosis or clubbing noted. Pedal hair growth normal.  Neurologic Normal speech. Oriented to person, place, and time. Epicritic sensation to light touch grossly present bilaterally.  Dermatologic Nails well groomed and normal in appearance. No open wounds. No skin lesions.  Orthopedic: Positive Mulder click in the first interspace pain on lateral squeeze test of the left foot.  The clinic is slightly improving.   Radiographs: None Assessment:   1. Morton's neuroma of left foot   2. Pain in left foot    Plan:  Patient was evaluated and treated and all questions answered.  Left first interspace neuroma -Today will be his fourth  2 cc of dehydrated alcohol for neurolysis injected in the first interspace of the left foot in standard manner.  No complications noted. -Given the patient's pain is improving over the course of these injections, I will continue to do them until we achieve complete destruction of the peripheral nerve.  No follow-ups on file.

## 2019-06-17 ENCOUNTER — Other Ambulatory Visit: Payer: Self-pay | Admitting: Cardiology

## 2019-06-18 MED FILL — METOPROLOL SUCCINATE ER 25: 25 | 30 days supply | Qty: 30 | Fill #0

## 2019-06-18 MED FILL — ELIQUIS 5 MG TABLET: 5 | 30 days supply | Qty: 60 | Fill #0

## 2019-06-18 NOTE — Telephone Encounter (Signed)
Left message for patient to return call.

## 2019-06-19 NOTE — Telephone Encounter (Addendum)
Called patient he sent a mychart message with all his concerns. Dr. Agustin Cree advised he increase eliquis to 5 mg twice daily. Patient also advised he no longer takes amlodipine, he hasn't since July due to it causing a rash. Will take this off his chart. Per Dr. Wendy Poet response they will discuss further at next office visit. Patient verbally understood. No further questions.

## 2019-06-24 ENCOUNTER — Ambulatory Visit (INDEPENDENT_AMBULATORY_CARE_PROVIDER_SITE_OTHER): Payer: 59 | Admitting: Cardiology

## 2019-06-24 ENCOUNTER — Other Ambulatory Visit: Payer: Self-pay

## 2019-06-24 ENCOUNTER — Encounter: Payer: Self-pay | Admitting: Cardiology

## 2019-06-24 VITALS — BP 164/82 | HR 52 | Ht 71.0 in | Wt 209.4 lb

## 2019-06-24 DIAGNOSIS — I480 Paroxysmal atrial fibrillation: Secondary | ICD-10-CM

## 2019-06-24 NOTE — Progress Notes (Signed)
Cardiology Office Note:    Date:  06/24/2019   ID:  Darius Mitchell, DOB 05-13-1951, MRN [id number]  PCP:  [name], DO  Cardiologist:  Jenne Campus, MD    Referring MD: [name]*   Chief Complaint  Patient presents with  . Follow-up    5 MO FU   I have a problem with the blood pressure  History of Present Illness:    Darius Mitchell is a 68 y.o. male with hypertension, paroxysmal atrial fibrillation.  Recently he ended going to the emergency room because of blood pressure being 190/111.  He also got headache at the same time he is also very frustrated with the fact that his doctor gradually getting up he is complaining about the fact that he is doing everything right but in spite of that he is elevated.  I explained to him the fact that when we get older blood pressure normally.  And we simply facing this phenomena with him.  I will do Chem-7 today and based on that I will decide what medication to help to help with his blood pressure.  Past Medical History:  Diagnosis Date  . Anxiety   . Appendicitis   . GERD (gastroesophageal reflux disease)   . Hyperlipidemia   . Hypertension   . Hypothyroidism     Past Surgical History:  Procedure Laterality Date  . APPENDECTOMY  1971  . WISDOM TOOTH EXTRACTION  1972    Current Medications: Current Meds  Medication Sig  . atorvastatin (LIPITOR) 80 MG tablet TAKE 1 TABLET BY MOUTH  DAILY  . ELIQUIS 5 MG TABS tablet TAKE 1 TABLET BY MOUTH TWICE DAILY FOR 30 DAYS  . levothyroxine (SYNTHROID) 112 MCG tablet TAKE 1 TABLET BY MOUTH  DAILY BEFORE BREAKFAST  . losartan (COZAAR) 100 MG tablet TAKE 1 TABLET BY MOUTH  DAILY  . metoprolol succinate (TOPROL-XL) 25 MG 24 hr tablet TAKE 1 TABLET BY MOUTH ONCE DAILY  . Multiple Vitamins-Minerals (CENTRUM SILVER ULTRA MENS) TABS Take by mouth daily.  Marland Kitchen omeprazole (PRILOSEC) 40 MG capsule TAKE 1 CAPSULE BY MOUTH  DAILY  . PARoxetine (PAXIL) 30 MG tablet TAKE 1 TABLET BY  MOUTH ONCE DAILY  . sildenafil (VIAGRA) 100 MG tablet Take 0.5-1 tablets (50-100 mg total) by mouth daily as needed for erectile dysfunction.     Allergies:   Codeine and Niacin and related   Social History   Socioeconomic History  . Marital status: Married    Spouse name: Not on file  . Number of children: Not on file  . Years of education: Not on file  . Highest education level: Not on file  Occupational History  . Not on file  Tobacco Use  . Smoking status: Former Smoker    Packs/day: 0.25    Years: 25.00    Pack years: 6.25    Types: Cigarettes    Quit date: 05/28/2019    Years since quitting: 0.0  . Smokeless tobacco: Never Used  Substance and Sexual Activity  . Alcohol use: Never  . Drug use: Never  . Sexual activity: Not on file  Other Topics Concern  . Not on file  Social History Narrative  . Not on file   Social Determinants of Health   Financial Resource Strain:   . Difficulty of Paying Living Expenses: Not on file  Food Insecurity:   . Worried About Charity fundraiser in the Last Year: Not on file  . Ran Out of Food  in the Last Year: Not on file  Transportation Needs:   . Lack of Transportation (Medical): Not on file  . Lack of Transportation (Non-Medical): Not on file  Physical Activity:   . Days of Exercise per Week: Not on file  . Minutes of Exercise per Session: Not on file  Stress:   . Feeling of Stress : Not on file  Social Connections:   . Frequency of Communication with Friends and Family: Not on file  . Frequency of Social Gatherings with Friends and Family: Not on file  . Attends Religious Services: Not on file  . Active Member of Clubs or Organizations: Not on file  . Attends Archivist Meetings: Not on file  . Marital Status: Not on file     Family History: The patient's family history includes Healthy in his brother, daughter, and sister; Heart attack in his mother; Heart disease in his father; Hypertension in his daughter;  Lymphoma (age of onset: 52) in his father; Stomach cancer in his paternal grandfather; Stroke in his maternal grandmother; Stroke (age of onset: 11) in his mother. ROS:   Please see the history of present illness.    All 14 point review of systems negative except as described per history of present illness  EKGs/Labs/Other Studies Reviewed:      Recent Labs: 11/23/2018: BUN 20; Creatinine, Ser 1.16; Hemoglobin 14.1; Magnesium 2.0; Platelets 149; Potassium 3.1; Sodium 138  Recent Lipid Panel    Component Value Date/Time   CHOL 183 05/24/2017 0803   TRIG 197.0 (H) 05/24/2017 0803   HDL 39.80 05/24/2017 0803   CHOLHDL 5 05/24/2017 0803   VLDL 39.4 05/24/2017 0803   LDLCALC 104 (H) 05/24/2017 0803    Physical Exam:    VS:  BP (!) 164/82   Pulse (!) 52   Ht '5\' 11"'$  (1.803 m)   Wt 209 lb 6.4 oz (95 kg)   SpO2 96%   BMI 29.21 kg/m     Wt Readings from Last 3 Encounters:  06/24/19 209 lb 6.4 oz (95 kg)  06/06/19 210 lb 8 oz (95.5 kg)  01/15/19 198 lb (89.8 kg)     GEN:  Well nourished, well developed in no acute distress HEENT: Normal NECK: No JVD; No carotid bruits LYMPHATICS: No lymphadenopathy CARDIAC: RRR, no murmurs, no rubs, no gallops RESPIRATORY:  Clear to auscultation without rales, wheezing or rhonchi  ABDOMEN: Soft, non-tender, non-distended MUSCULOSKELETAL:  No edema; No deformity  SKIN: Warm and dry LOWER EXTREMITIES: no swelling NEUROLOGIC:  Alert and oriented x 3 PSYCHIATRIC:  Normal affect   ASSESSMENT:    1. Paroxysmal atrial fibrillation (HCC)    PLAN:    In order of problems listed above:  1. Paroxysmal atrial fibrillation.  He is anticoagulated which I will continue.  Doing well from that point review continue present management. 2. Essential hypertension we will do Chem-7 that we will decide about future therapy.  He did have problem with Norvasc because of sinus skin changes. 3. Hypothyroidism followed by 10 medicine team.   Medication  Adjustments/Labs and Tests Ordered: Current medicines are reviewed at length with the patient today.  Concerns regarding medicines are outlined above.  Orders Placed This Encounter  Procedures  . Comp Met (CMET)   Medication changes: No orders of the defined types were placed in this encounter.   Signed, Park Liter, MD, [facility] 06/24/2019 4:39 PM    [place]

## 2019-06-24 NOTE — Patient Instructions (Signed)
Medication Instructions:  Your physician recommends that you continue on your current medications as directed. Please refer to the Current Medication list given to you today.  *If you need a refill on your cardiac medications before your next appointment, please call your pharmacy*  Lab Work: CMET today If you have labs (blood work) drawn today and your tests are completely normal, you will receive your results only by: Marland Kitchen MyChart Message (if you have MyChart) OR . A paper copy in the mail If you have any lab test that is abnormal or we need to change your treatment, we will call you to review the results.  Testing/Procedures: None today  Follow-Up: At [facility], you and your health needs are our priority.  As part of our continuing mission to provide you with exceptional heart care, we have created designated Provider Care Teams.  These Care Teams include your primary Cardiologist (physician) and Advanced Practice Providers (APPs -  Physician Assistants and Nurse Practitioners) who all work together to provide you with the care you need, when you need it.  Your next appointment:   1 month(s)  The format for your next appointment:   In Person  Provider:   Jenne Campus, MD  Other Instructions Next apt 07/26/2019 8:05 am [place] office.

## 2019-06-25 LAB — COMPREHENSIVE METABOLIC PANEL
ALT: 23 IU/L (ref 0–44)
AST: 28 IU/L (ref 0–40)
Albumin/Globulin Ratio: 1.9 (ref 1.2–2.2)
Albumin: 4.7 g/dL (ref 3.8–4.8)
Alkaline Phosphatase: 85 IU/L (ref 39–117)
BUN/Creatinine Ratio: 17 (ref 10–24)
BUN: 18 mg/dL (ref 8–27)
Bilirubin Total: 0.8 mg/dL (ref 0.0–1.2)
CO2: 26 mmol/L (ref 20–29)
Calcium: 9.8 mg/dL (ref 8.6–10.2)
Chloride: 104 mmol/L (ref 96–106)
Creatinine, Ser: 1.08 mg/dL (ref 0.76–1.27)
GFR calc Af Amer: 80 mL/min/{1.73_m2} (ref >59)
GFR calc non Af Amer: 69 mL/min/{1.73_m2} (ref >59)
Globulin, Total: 2.5 g/dL (ref 1.5–4.5)
Glucose: 92 mg/dL (ref 65–99)
Potassium: 4.9 mmol/L (ref 3.5–5.2)
Sodium: 142 mmol/L (ref 134–144)
Total Protein: 7.2 g/dL (ref 6.0–8.5)

## 2019-06-26 ENCOUNTER — Telehealth: Payer: Self-pay | Admitting: Emergency Medicine

## 2019-06-26 NOTE — Telephone Encounter (Signed)
Left message for patient to return call regarding lab results.  

## 2019-07-05 ENCOUNTER — Telehealth: Payer: Self-pay

## 2019-07-05 ENCOUNTER — Ambulatory Visit: Payer: 59 | Admitting: Podiatry

## 2019-07-05 ENCOUNTER — Other Ambulatory Visit: Payer: Self-pay

## 2019-07-05 MED ORDER — AMLODIPINE BESYLATE 5 MG PO TABS: 5.0000 mg | ORAL_TABLET | Freq: Every day | ORAL | 3 refills | Status: DC

## 2019-07-05 NOTE — Telephone Encounter (Signed)
Called patient and left detailed message with Dr. Marthann Schiller recommendations. Told patient to call back if he had any further questions or concerns.   Confirmed ok to leave message per DPR.    Will put in request for amlodipine 5 MG daily to pharmacy

## 2019-07-18 MED FILL — METOPROLOL SUCCINATE ER 25: 25 | 30 days supply | Qty: 30 | Fill #1

## 2019-07-19 ENCOUNTER — Ambulatory Visit (INDEPENDENT_AMBULATORY_CARE_PROVIDER_SITE_OTHER): Payer: 59 | Admitting: Podiatry

## 2019-07-19 ENCOUNTER — Encounter: Payer: Self-pay | Admitting: Podiatry

## 2019-07-19 ENCOUNTER — Other Ambulatory Visit: Payer: Self-pay

## 2019-07-19 DIAGNOSIS — M79672 Pain in left foot: Secondary | ICD-10-CM | POA: Diagnosis not present

## 2019-07-19 DIAGNOSIS — G5762 Lesion of plantar nerve, left lower limb: Secondary | ICD-10-CM | POA: Diagnosis not present

## 2019-07-19 NOTE — Progress Notes (Signed)
Subjective:  Patient ID: Darius Mitchell, male    DOB: 1951/06/20,  MRN: [id number]  Chief Complaint  Patient presents with  . Neuroma    pt is here for a 3 week neuroma injection f/u, pt states that the left foot does feel better with the injections, pt is on his 5th possible neuroma injection, pt has no comments or concerns    69 y.o. male presents with the above complaint.  Patient is here for his neuroma injection with dehydrated alcohol.  He denies any other acute complaints he states that this has been helping a little bit.  This will be his fifth injection. His pain has improved.   Review of Systems: Negative except as noted in the HPI. Denies N/V/F/Ch.  Past Medical History:  Diagnosis Date  . Anxiety   . Appendicitis   . GERD (gastroesophageal reflux disease)   . Hyperlipidemia   . Hypertension   . Hypothyroidism     Current Outpatient Medications:  .  amLODipine (NORVASC) 5 MG tablet, Take 1 tablet (5 mg total) by mouth daily., Disp: 180 tablet, Rfl: 3 .  atorvastatin (LIPITOR) 80 MG tablet, TAKE 1 TABLET BY MOUTH  DAILY, Disp: 90 tablet, Rfl: 3 .  ELIQUIS 5 MG TABS tablet, TAKE 1 TABLET BY MOUTH TWICE DAILY FOR 30 DAYS, Disp: 60 tablet, Rfl: 2 .  levothyroxine (SYNTHROID) 112 MCG tablet, TAKE 1 TABLET BY MOUTH  DAILY BEFORE BREAKFAST, Disp: 90 tablet, Rfl: 3 .  losartan (COZAAR) 100 MG tablet, TAKE 1 TABLET BY MOUTH  DAILY, Disp: 90 tablet, Rfl: 3 .  metoprolol succinate (TOPROL-XL) 25 MG 24 hr tablet, TAKE 1 TABLET BY MOUTH ONCE DAILY, Disp: 90 tablet, Rfl: 1 .  Multiple Vitamins-Minerals (CENTRUM SILVER ULTRA MENS) TABS, Take by mouth daily., Disp: , Rfl:  .  omeprazole (PRILOSEC) 40 MG capsule, TAKE 1 CAPSULE BY MOUTH  DAILY, Disp: 90 capsule, Rfl: 3 .  PARoxetine (PAXIL) 30 MG tablet, TAKE 1 TABLET BY MOUTH ONCE DAILY, Disp: 90 tablet, Rfl: 3 .  sildenafil (VIAGRA) 100 MG tablet, Take 0.5-1 tablets (50-100 mg total) by mouth daily as needed for erectile dysfunction.,  Disp: 10 tablet, Rfl: 0  Social History   Tobacco Use  Smoking Status Former Smoker  . Packs/day: 0.25  . Years: 25.00  . Pack years: 6.25  . Types: Cigarettes  . Quit date: 05/28/2019  . Years since quitting: 0.1  Smokeless Tobacco Never Used    Allergies  Allergen Reactions  . Codeine Nausea Only  . Niacin And Related    Objective:  There were no vitals filed for this visit. There is no height or weight on file to calculate BMI. Constitutional Well developed. Well nourished.  Vascular Dorsalis pedis pulses palpable bilaterally. Posterior tibial pulses palpable bilaterally. Capillary refill normal to all digits.  No cyanosis or clubbing noted. Pedal hair growth normal.  Neurologic Normal speech. Oriented to person, place, and time. Epicritic sensation to light touch grossly present bilaterally.  Dermatologic Nails well groomed and normal in appearance. No open wounds. No skin lesions.  Orthopedic: Positive Mulder click in the first interspace pain on lateral squeeze test of the left foot.  The clinic is slightly improving.   Radiographs: None Assessment:   1. Morton's neuroma of left foot   2. Pain in left foot    Plan:  Patient was evaluated and treated and all questions answered.  Left first interspace neuroma -Today will be his fifth 2 cc of dehydrated alcohol  for neurolysis injected in the first interspace of the left foot in standard manner.  No complications noted. -Given the patient's pain is improving over the course of these injections, I will continue to do them until we achieve complete destruction of the peripheral nerve.  No follow-ups on file.

## 2019-07-26 ENCOUNTER — Encounter: Payer: Self-pay | Admitting: Cardiology

## 2019-07-26 ENCOUNTER — Other Ambulatory Visit: Payer: Self-pay

## 2019-07-26 ENCOUNTER — Ambulatory Visit (INDEPENDENT_AMBULATORY_CARE_PROVIDER_SITE_OTHER): Payer: 59 | Admitting: Cardiology

## 2019-07-26 VITALS — BP 134/76 | HR 53 | Ht 71.0 in | Wt 211.0 lb

## 2019-07-26 DIAGNOSIS — E782 Mixed hyperlipidemia: Secondary | ICD-10-CM

## 2019-07-26 DIAGNOSIS — I480 Paroxysmal atrial fibrillation: Secondary | ICD-10-CM | POA: Diagnosis not present

## 2019-07-26 DIAGNOSIS — I10 Essential (primary) hypertension: Secondary | ICD-10-CM

## 2019-07-26 NOTE — Patient Instructions (Signed)
Medication Instructions:  Your physician recommends that you continue on your current medications as directed. Please refer to the Current Medication list given to you today.  *If you need a refill on your cardiac medications before your next appointment, please call your pharmacy*  Lab Work: None.  If you have labs (blood work) drawn today and your tests are completely normal, you will receive your results only by: Marland Kitchen MyChart Message (if you have MyChart) OR . A paper copy in the mail If you have any lab test that is abnormal or we need to change your treatment, we will call you to review the results.  Testing/Procedures: None.   Follow-Up: At [facility], you and your health needs are our priority.  As part of our continuing mission to provide you with exceptional heart care, we have created designated Provider Care Teams.  These Care Teams include your primary Cardiologist (physician) and Advanced Practice Providers (APPs -  Physician Assistants and Nurse Practitioners) who all work together to provide you with the care you need, when you need it.  Your next appointment:   4 month(s)  The format for your next appointment:   In Person  Provider:   Jenne Campus, MD  Other Instructions

## 2019-07-26 NOTE — Progress Notes (Signed)
Cardiology Office Note:    Date:  07/26/2019   ID:  Darius Mitchell, DOB 07-14-1950, MRN [id number]  PCP:  [name], DO  Cardiologist:  Jenne Campus, MD    Referring MD: [name]*   Chief Complaint  Patient presents with  . Follow-up    1 MO FU     History of Present Illness:    Darius Mitchell is a 69 y.o. male with past medical history significant for paroxysmal atrial fibrillation, essential hypertension, dyslipidemia.  Comes today to my office for follow-up overall he is doing great asymptomatic.  Denies have any palpitation no tightness squeezing pressure burning chest.  He is trying to be more active and exercise more complain of poor weather.  He loves to do golfing but cannot right now because of weather is getting ready to retire.  We talked in length about healthy lifestyle need to exercise and good eating habits.  Past Medical History:  Diagnosis Date  . Anxiety   . Appendicitis   . GERD (gastroesophageal reflux disease)   . Hyperlipidemia   . Hypertension   . Hypothyroidism     Past Surgical History:  Procedure Laterality Date  . APPENDECTOMY  1971  . WISDOM TOOTH EXTRACTION  1972    Current Medications: Current Meds  Medication Sig  . amLODipine (NORVASC) 5 MG tablet Take 1 tablet (5 mg total) by mouth daily.  Marland Kitchen atorvastatin (LIPITOR) 80 MG tablet TAKE 1 TABLET BY MOUTH  DAILY  . ELIQUIS 5 MG TABS tablet TAKE 1 TABLET BY MOUTH TWICE DAILY FOR 30 DAYS  . levothyroxine (SYNTHROID) 112 MCG tablet TAKE 1 TABLET BY MOUTH  DAILY BEFORE BREAKFAST  . losartan (COZAAR) 100 MG tablet TAKE 1 TABLET BY MOUTH  DAILY  . metoprolol succinate (TOPROL-XL) 25 MG 24 hr tablet TAKE 1 TABLET BY MOUTH ONCE DAILY  . Multiple Vitamins-Minerals (CENTRUM SILVER ULTRA MENS) TABS Take by mouth daily.  Marland Kitchen omeprazole (PRILOSEC) 40 MG capsule TAKE 1 CAPSULE BY MOUTH  DAILY  . PARoxetine (PAXIL) 30 MG tablet TAKE 1 TABLET BY MOUTH ONCE DAILY     Allergies:    Codeine and Niacin and related   Social History   Socioeconomic History  . Marital status: Married    Spouse name: Not on file  . Number of children: Not on file  . Years of education: Not on file  . Highest education level: Not on file  Occupational History  . Not on file  Tobacco Use  . Smoking status: Former Smoker    Packs/day: 0.25    Years: 25.00    Pack years: 6.25    Types: Cigarettes    Quit date: 05/28/2019    Years since quitting: 0.1  . Smokeless tobacco: Never Used  Substance and Sexual Activity  . Alcohol use: Never  . Drug use: Never  . Sexual activity: Not on file  Other Topics Concern  . Not on file  Social History Narrative  . Not on file   Social Determinants of Health   Financial Resource Strain:   . Difficulty of Paying Living Expenses: Not on file  Food Insecurity:   . Worried About Charity fundraiser in the Last Year: Not on file  . Ran Out of Food in the Last Year: Not on file  Transportation Needs:   . Lack of Transportation (Medical): Not on file  . Lack of Transportation (Non-Medical): Not on file  Physical Activity:   . Days of  Exercise per Week: Not on file  . Minutes of Exercise per Session: Not on file  Stress:   . Feeling of Stress : Not on file  Social Connections:   . Frequency of Communication with Friends and Family: Not on file  . Frequency of Social Gatherings with Friends and Family: Not on file  . Attends Religious Services: Not on file  . Active Member of Clubs or Organizations: Not on file  . Attends Archivist Meetings: Not on file  . Marital Status: Not on file     Family History: The patient's family history includes Healthy in his brother, daughter, and sister; Heart attack in his mother; Heart disease in his father; Hypertension in his daughter; Lymphoma (age of onset: 58) in his father; Stomach cancer in his paternal grandfather; Stroke in his maternal grandmother; Stroke (age of onset: 98) in his  mother. ROS:   Please see the history of present illness.    All 14 point review of systems negative except as described per history of present illness  EKGs/Labs/Other Studies Reviewed:    Echocardiogram done on 12/11/2018 showed:   1. The left ventricle has normal systolic function with an ejection fraction of 60-65%. The cavity size was normal. There is moderate concentric left ventricular hypertrophy. Left ventricular diastolic parameters were normal.  2. The right ventricle has normal systolic function. The cavity was normal. There is no increase in right ventricular wall thickness.  3. Left atrial size was moderately dilated.  4. Right atrial size was mildly dilated.  5. The mitral valve is degenerative. Mild calcification of the anterior mitral valve leaflet. No evidence of mitral valve stenosis.  6. The aortic valve is tricuspid. Mild thickening of the aortic valve. Aortic valve regurgitation is mild by color flow Doppler.  7. There is moderate dilatation of the ascending aorta measuring 40 mm.  Recent Labs: 11/23/2018: Hemoglobin 14.1; Magnesium 2.0; Platelets 149 06/24/2019: ALT 23; BUN 18; Creatinine, Ser 1.08; Potassium 4.9; Sodium 142  Recent Lipid Panel    Component Value Date/Time   CHOL 183 05/24/2017 0803   TRIG 197.0 (H) 05/24/2017 0803   HDL 39.80 05/24/2017 0803   CHOLHDL 5 05/24/2017 0803   VLDL 39.4 05/24/2017 0803   LDLCALC 104 (H) 05/24/2017 0803    Physical Exam:    VS:  BP 134/76   Pulse (!) 53   Ht 5\' 11"  (1.803 m)   Wt 211 lb (95.7 kg)   SpO2 99%   BMI 29.43 kg/m     Wt Readings from Last 3 Encounters:  07/26/19 211 lb (95.7 kg)  06/24/19 209 lb 6.4 oz (95 kg)  06/06/19 210 lb 8 oz (95.5 kg)     GEN:  Well nourished, well developed in no acute distress HEENT: Normal NECK: No JVD; No carotid bruits LYMPHATICS: No lymphadenopathy CARDIAC: RRR, no murmurs, no rubs, no gallops RESPIRATORY:  Clear to auscultation without rales, wheezing or  rhonchi  ABDOMEN: Soft, non-tender, non-distended MUSCULOSKELETAL:  No edema; No deformity  SKIN: Warm and dry LOWER EXTREMITIES: no swelling NEUROLOGIC:  Alert and oriented x 3 PSYCHIATRIC:  Normal affect   ASSESSMENT:    No diagnosis found. PLAN:    In order of problems listed above:  1. Paroxysmal atrial fibrillation, anticoagulated which I will continue.  Denies have any palpitations doing well. 2. Essential hypertension blood pressure appears to be well controlled asking to keep check on the regular basis let me know if you have some difficulties.  Last time we checked complete metabolic panel which was perfectly normal. 3. Dyslipidemia he is on high intensity statin which I will continue.  We will make arrangements for fasting lipid profile. 4. Aortic root enlargement measuring 40 mm on echocardiogram from summer.  We will schedule him to have another test done in the of this year.  Overall he is doing well I see him back in 4 months or sooner if he has a problem   Medication Adjustments/Labs and Tests Ordered: Current medicines are reviewed at length with the patient today.  Concerns regarding medicines are outlined above.  No orders of the defined types were placed in this encounter.  Medication changes: No orders of the defined types were placed in this encounter.   Signed, Park Liter, MD, [facility] 07/26/2019 8:35 AM    [place]

## 2019-08-07 ENCOUNTER — Other Ambulatory Visit: Payer: Self-pay

## 2019-08-07 ENCOUNTER — Ambulatory Visit (INDEPENDENT_AMBULATORY_CARE_PROVIDER_SITE_OTHER): Payer: 59 | Admitting: Podiatry

## 2019-08-07 DIAGNOSIS — G5762 Lesion of plantar nerve, left lower limb: Secondary | ICD-10-CM

## 2019-08-07 DIAGNOSIS — M79672 Pain in left foot: Secondary | ICD-10-CM | POA: Diagnosis not present

## 2019-08-08 ENCOUNTER — Encounter: Payer: Self-pay | Admitting: Podiatry

## 2019-08-08 NOTE — Progress Notes (Signed)
Subjective:  Patient ID: Darius Mitchell, male    DOB: 22-Nov-1950,  MRN: [id number]  Chief Complaint  Patient presents with  . Neuroma    Pt states injections very effective "I do not have any pain"    69 y.o. male presents with the above complaint.  Patient is here for his neuroma injection with dehydrated alcohol.  Patient states his pain is considerably improved.  He has been able to return back to normal activities.  Given the clinical improvement I believe this will be his last injection today.  He denies any other acute complaints.   Review of Systems: Negative except as noted in the HPI. Denies N/V/F/Ch.  Past Medical History:  Diagnosis Date  . Anxiety   . Appendicitis   . GERD (gastroesophageal reflux disease)   . Hyperlipidemia   . Hypertension   . Hypothyroidism     Current Outpatient Medications:  .  amLODipine (NORVASC) 5 MG tablet, Take 1 tablet (5 mg total) by mouth daily., Disp: 180 tablet, Rfl: 3 .  atorvastatin (LIPITOR) 80 MG tablet, TAKE 1 TABLET BY MOUTH  DAILY, Disp: 90 tablet, Rfl: 3 .  ELIQUIS 5 MG TABS tablet, TAKE 1 TABLET BY MOUTH TWICE DAILY FOR 30 DAYS, Disp: 60 tablet, Rfl: 2 .  levothyroxine (SYNTHROID) 112 MCG tablet, TAKE 1 TABLET BY MOUTH  DAILY BEFORE BREAKFAST, Disp: 90 tablet, Rfl: 3 .  losartan (COZAAR) 100 MG tablet, TAKE 1 TABLET BY MOUTH  DAILY, Disp: 90 tablet, Rfl: 3 .  metoprolol succinate (TOPROL-XL) 25 MG 24 hr tablet, TAKE 1 TABLET BY MOUTH ONCE DAILY, Disp: 90 tablet, Rfl: 1 .  Multiple Vitamins-Minerals (CENTRUM SILVER ULTRA MENS) TABS, Take by mouth daily., Disp: , Rfl:  .  omeprazole (PRILOSEC) 40 MG capsule, TAKE 1 CAPSULE BY MOUTH  DAILY, Disp: 90 capsule, Rfl: 3 .  PARoxetine (PAXIL) 30 MG tablet, TAKE 1 TABLET BY MOUTH ONCE DAILY, Disp: 90 tablet, Rfl: 3 .  sildenafil (VIAGRA) 100 MG tablet, Take 0.5-1 tablets (50-100 mg total) by mouth daily as needed for erectile dysfunction., Disp: 10 tablet, Rfl: 0  Social History    Tobacco Use  Smoking Status Former Smoker  . Packs/day: 0.25  . Years: 25.00  . Pack years: 6.25  . Types: Cigarettes  . Quit date: 05/28/2019  . Years since quitting: 0.1  Smokeless Tobacco Never Used    Allergies  Allergen Reactions  . Codeine Nausea Only  . Niacin And Related    Objective:  There were no vitals filed for this visit. There is no height or weight on file to calculate BMI. Constitutional Well developed. Well nourished.  Vascular Dorsalis pedis pulses palpable bilaterally. Posterior tibial pulses palpable bilaterally. Capillary refill normal to all digits.  No cyanosis or clubbing noted. Pedal hair growth normal.  Neurologic Normal speech. Oriented to person, place, and time. Epicritic sensation to light touch grossly present bilaterally.  Dermatologic Nails well groomed and normal in appearance. No open wounds. No skin lesions.  Orthopedic:  Negative Mulder click in the first interspace pain on lateral squeeze test of the left foot.  No further pain on palpation T patient has clinically improved completely.   Radiographs: None Assessment:   1. Morton's neuroma of left foot   2. Pain in left foot    Plan:  Patient was evaluated and treated and all questions answered.  Left first interspace neuroma -Today will be his final and sixth injection of 2 cc of dehydrated alcohol for neurolysis  injected in the first interspace of the left foot in standard manner.  No complications noted. -Patient has achieved clinical improvement of the neuroma.  At this time I will hold off on any further injections.  I have asked the patient to come back and see me if any foot and ankle issues arises.  This will be his last injection.  Patient states understanding and will do so.  Return if symptoms worsen or fail to improve.

## 2019-08-11 ENCOUNTER — Encounter: Payer: Self-pay | Admitting: Family Medicine

## 2019-08-12 ENCOUNTER — Ambulatory Visit (INDEPENDENT_AMBULATORY_CARE_PROVIDER_SITE_OTHER): Payer: 59 | Admitting: Internal Medicine

## 2019-08-12 ENCOUNTER — Encounter: Payer: Self-pay | Admitting: Internal Medicine

## 2019-08-12 ENCOUNTER — Other Ambulatory Visit: Payer: Self-pay

## 2019-08-12 VITALS — BP 125/65 | HR 52 | Temp 95.6°F | Resp 16 | Ht 71.0 in | Wt 216.2 lb

## 2019-08-12 DIAGNOSIS — R202 Paresthesia of skin: Secondary | ICD-10-CM

## 2019-08-12 DIAGNOSIS — I10 Essential (primary) hypertension: Secondary | ICD-10-CM

## 2019-08-12 DIAGNOSIS — R600 Localized edema: Secondary | ICD-10-CM

## 2019-08-12 MED ORDER — HYDROCHLOROTHIAZIDE 25 MG PO TABS: 25.0000 mg | ORAL_TABLET | Freq: Every day | ORAL | 0 refills | Status: DC

## 2019-08-12 MED FILL — HYDROCHLOROTHIAZIDE 25 MG T: 25 | 30 days supply | Qty: 30 | Fill #0

## 2019-08-12 NOTE — Progress Notes (Signed)
Subjective:    Patient ID: Darius Mitchell, male    DOB: 20-Jul-1950, 69 y.o.   MRN: [id number]  DOS:  08/12/2019 Type of visit - description: Acute  For several months, his left leg has been more swollen than the right. He remains concerned about this issue. Denies any pain, redness, warmness. Edema is not necessarily worse at the end of the day.  Also, has episodic pain located around the left shoulder, the pain radiates down to his left arm up to the wrist. Described as a numbness or "pins-and-needles". This started a month ago. Denies neck pain per se. Denies lower extremity paresthesias or any gait disorder.  Good compliance with medications including Eliquis  Review of Systems Denies chest pain or difficulty breathing No headaches   Past Medical History:  Diagnosis Date  . Anxiety   . Appendicitis   . GERD (gastroesophageal reflux disease)   . Hyperlipidemia   . Hypertension   . Hypothyroidism     Past Surgical History:  Procedure Laterality Date  . APPENDECTOMY  1971  . WISDOM TOOTH EXTRACTION  1972    Allergies as of 08/12/2019      Reactions   Codeine Nausea Only   Niacin And Related       Medication List       Accurate as of August 12, 2019 11:59 PM. If you have any questions, ask your nurse or doctor.        STOP taking these medications   amLODipine 5 MG tablet Commonly known as: NORVASC Stopped by: Kathlene November, MD     TAKE these medications   atorvastatin 80 MG tablet Commonly known as: LIPITOR TAKE 1 TABLET BY MOUTH  DAILY   Centrum Silver Ultra Mens Tabs Take by mouth daily.   Eliquis 5 MG Tabs tablet Generic drug: apixaban TAKE 1 TABLET BY MOUTH TWICE DAILY FOR 30 DAYS   hydrochlorothiazide 25 MG tablet Commonly known as: HYDRODIURIL Take 1 tablet (25 mg total) by mouth daily. Started by: Kathlene November, MD   levothyroxine 112 MCG tablet Commonly known as: SYNTHROID TAKE 1 TABLET BY MOUTH  DAILY BEFORE BREAKFAST   losartan 100 MG  tablet Commonly known as: COZAAR TAKE 1 TABLET BY MOUTH  DAILY   metoprolol succinate 25 MG 24 hr tablet Commonly known as: TOPROL-XL TAKE 1 TABLET BY MOUTH ONCE DAILY   omeprazole 40 MG capsule Commonly known as: PRILOSEC TAKE 1 CAPSULE BY MOUTH  DAILY   PARoxetine 30 MG tablet Commonly known as: PAXIL TAKE 1 TABLET BY MOUTH ONCE DAILY   sildenafil 100 MG tablet Commonly known as: VIAGRA Take 0.5-1 tablets (50-100 mg total) by mouth daily as needed for erectile dysfunction.             Objective:   Physical Exam BP 125/65 (BP Location: Right Arm, Patient Position: Sitting, Cuff Size: Normal)   Pulse (!) 52   Temp (!) 95.6 F (35.3 C) (Temporal)   Resp 16   Ht 5\' 11"  (1.803 m)   Wt 216 lb 4 oz (98.1 kg)   SpO2 100%   BMI 30.16 kg/m  General:   Well developed, NAD, BMI noted. HEENT:  Normocephalic . Face symmetric, atraumatic Neck: No TTP at the cervical spine, range of motion seems normal. Lungs:  CTA B Normal respiratory effort, no intercostal retractions, no accessory muscle use. Heart: Seems regular today, no murmur.  Lower extremities:   Bilateral small varicose veins without erythema or tenderness. Good pedal  pulses bilaterally Trace pretibial edema, calves symmetric and not tender +/+++ Periankle edema L>R  Skin: Not pale. Not jaundice Neurologic:  alert & oriented X3.  Speech normal, gait appropriate for age and unassisted. Motor: Symmetric.  DTR symmetric. Psych--  Cognition and judgment appear intact.  Cooperative with normal attention span and concentration.  Behavior appropriate. No anxious or depressed appearing.      Assessment    69 year old gentleman, PMH includes paroxysmal atrial fibrillation anticoagulated, thyroid disease, high cholesterol, HTN, and with:  Lower extremity edema: Likely combination of things including varicose veins and CCB's. He had a cardiology evaluation back in 2019 for essentially the same problem (L>R LE  edama) no DVT found at the time. Low suspicion for acute DVT at this point. Plan: Stop amlodipine, consider compression stockings. HTN: Seems well controlled on losartan, metoprolol.  We are stopping amlodipine thus will start hydrochlorothiazide.  BMP in 2 weeks, monitor BPs. Hypothyroidism: Check a TSH in 2 weeks Left upper arm paresthesia: Discussed possibly referral, declined for now, recommend to discuss with PCP RTC 2 weeks for blood work, 6 weeks CPX PCP  This visit occurred during the SARS-CoV-2 public health emergency.  Safety protocols were in place, including screening questions prior to the visit, additional usage of staff PPE, and extensive cleaning of exam room while observing appropriate contact time as indicated for disinfecting solutions.

## 2019-08-12 NOTE — Progress Notes (Signed)
Pre visit review using our clinic review tool, if applicable. No additional management support is needed unless otherwise documented below in the visit note. 

## 2019-08-12 NOTE — Patient Instructions (Addendum)
  GO TO THE FRONT DESK Come back for blood work only in 2 weeks, please make an appointment  Schedule an appointment to see Dr. Nani Ravens and have a complete physical in 6 weeks    Stop amlodipine  Start hydrochlorothiazide 25 mg 1 tablet in the morning  Continue all other medications  Check your blood pressure 3-4 times a week BP GOAL is between 110/65 and  135/85. If it is consistently higher or lower, let me know  Consider using a compression stocking

## 2019-08-19 MED FILL — METOPROLOL SUCCINATE ER 25: 25 | 30 days supply | Qty: 30 | Fill #2

## 2019-08-28 ENCOUNTER — Other Ambulatory Visit (INDEPENDENT_AMBULATORY_CARE_PROVIDER_SITE_OTHER): Payer: 59

## 2019-08-28 ENCOUNTER — Other Ambulatory Visit: Payer: Self-pay

## 2019-08-28 ENCOUNTER — Other Ambulatory Visit: Payer: 59

## 2019-08-28 DIAGNOSIS — I10 Essential (primary) hypertension: Secondary | ICD-10-CM | POA: Diagnosis not present

## 2019-08-28 LAB — TSH: TSH: 5.97 u[IU]/mL — ABNORMAL HIGH (ref 0.35–4.50)

## 2019-08-28 LAB — BASIC METABOLIC PANEL
BUN: 18 mg/dL (ref 6–23)
CO2: 30 mEq/L (ref 19–32)
Calcium: 9.5 mg/dL (ref 8.4–10.5)
Chloride: 102 mEq/L (ref 96–112)
Creatinine, Ser: 1.02 mg/dL (ref 0.40–1.50)
GFR: 72.07 mL/min (ref >60.00)
Glucose, Bld: 98 mg/dL (ref 70–99)
Potassium: 3.6 mEq/L (ref 3.5–5.1)
Sodium: 139 mEq/L (ref 135–145)

## 2019-09-02 MED ORDER — LEVOTHYROXINE SODIUM 125 MCG PO TABS: 125.0000 ug | ORAL_TABLET | Freq: Every day | ORAL | 3 refills | Status: DC

## 2019-09-02 MED ORDER — HYDROCHLOROTHIAZIDE 25 MG PO TABS: 25.0000 mg | ORAL_TABLET | Freq: Every day | ORAL | 3 refills | Status: DC

## 2019-09-02 MED FILL — LEVOTHYROXINE SODIUM 125 MC: 125 | 30 days supply | Qty: 30 | Fill #0

## 2019-09-02 NOTE — Addendum Note (Signed)
Addended byDamita Dunnings D on: 09/02/2019 07:44 AM   Modules accepted: Orders

## 2019-09-05 MED FILL — HYDROCHLOROTHIAZIDE 25 MG T: 25 | 30 days supply | Qty: 30 | Fill #0

## 2019-09-05 MED FILL — ELIQUIS 5 MG TABLET: 5 | 30 days supply | Qty: 60 | Fill #1

## 2019-09-20 ENCOUNTER — Other Ambulatory Visit: Payer: Self-pay

## 2019-09-20 MED FILL — METOPROLOL SUCCINATE ER 25: 25 | 30 days supply | Qty: 30 | Fill #3

## 2019-09-23 ENCOUNTER — Other Ambulatory Visit: Payer: Self-pay

## 2019-09-23 ENCOUNTER — Ambulatory Visit (INDEPENDENT_AMBULATORY_CARE_PROVIDER_SITE_OTHER): Payer: 59 | Admitting: Family Medicine

## 2019-09-23 ENCOUNTER — Encounter: Payer: Self-pay | Admitting: Family Medicine

## 2019-09-23 VITALS — BP 132/84 | HR 60 | Temp 95.1°F | Ht 71.0 in | Wt 214.0 lb

## 2019-09-23 DIAGNOSIS — Z0000 Encounter for general adult medical examination without abnormal findings: Secondary | ICD-10-CM

## 2019-09-23 DIAGNOSIS — E039 Hypothyroidism, unspecified: Secondary | ICD-10-CM | POA: Diagnosis not present

## 2019-09-23 NOTE — Patient Instructions (Addendum)
The new Shingrix vaccine (for shingles) is a 2 shot series. It can make people feel low energy, achy and almost like they have the flu for 48 hours after injection. Please plan accordingly when deciding on when to get this shot. Call our office for a nurse visit appointment to get this. The second shot of the series is less severe regarding the side effects, but it still lasts 48 hours.   Give [place] 2-3 business days to get the results of your labs back.   Keep the diet clean and stay active.  Let us know if you need anything.  Trapezius stretches/exercises Do exercises exactly as told by your health care provider and adjust them as directed. It is normal to feel mild stretching, pulling, tightness, or discomfort as you do these exercises, but you should stop right away if you feel sudden pain or your pain gets worse.  Stretching and range of motion exercises These exercises warm up your muscles and joints and improve the movement and flexibility of your shoulder. These exercises can also help to relieve pain, numbness, and tingling. If you are unable to do any of the following for any reason, do not further attempt to do it.   Exercise A: Flexion, standing    1. Stand and hold a broomstick, a cane, or a similar object. Place your hands a little more than shoulder-width apart on the object. Your left / right hand should be palm-up, and your other hand should be palm-down. 2. Push the stick to raise your left / right arm out to your side and then over your head. Use your other hand to help move the stick. Stop when you feel a stretch in your shoulder, or when you reach the angle that is recommended by your health care provider. ? Avoid shrugging your shoulder while you raise your arm. Keep your shoulder blade tucked down toward your spine. 3. Hold for 30 seconds. 4. Slowly return to the starting position. Repeat 2 times. Complete this exercise 3 times per week.  Exercise B: Abduction, supine     1. Lie on your back and hold a broomstick, a cane, or a similar object. Place your hands a little more than shoulder-width apart on the object. Your left / right hand should be palm-up, and your other hand should be palm-down. 2. Push the stick to raise your left / right arm out to your side and then over your head. Use your other hand to help move the stick. Stop when you feel a stretch in your shoulder, or when you reach the angle that is recommended by your health care provider. ? Avoid shrugging your shoulder while you raise your arm. Keep your shoulder blade tucked down toward your spine. 3. Hold for 30 seconds. 4. Slowly return to the starting position. Repeat 2 times. Complete this exercise 3 times per week.  Exercise C: Flexion, active-assisted    1. Lie on your back. You may bend your knees for comfort. 2. Hold a broomstick, a cane, or a similar object. Place your hands about shoulder-width apart on the object. Your palms should face toward your feet. 3. Raise the stick and move your arms over your head and behind your head, toward the floor. Use your healthy arm to help your left / right arm move farther. Stop when you feel a gentle stretch in your shoulder, or when you reach the angle where your health care provider tells you to stop. 4. Hold for 30  seconds. 5. Slowly return to the starting position. Repeat 2 times. Complete this exercise 3 times per week.  Exercise D: External rotation and abduction    1. Stand in a door frame with one of your feet slightly in front of the other. This is called a staggered stance. 2. Choose one of the following positions as told by your health care provider: ? Place your hands and forearms on the door frame above your head. ? Place your hands and forearms on the door frame at the height of your head. ? Place your hands on the door frame at the height of your elbows. 3. Slowly move your weight onto your front foot until you feel a stretch  across your chest and in the front of your shoulders. Keep your head and chest upright and keep your abdominal muscles tight. 4. Hold for 30 seconds. 5. To release the stretch, shift your weight to your back foot. Repeat 2 times. Complete this stretch 3 times per week.  Strengthening exercises These exercises build strength and endurance in your shoulder. Endurance is the ability to use your muscles for a long time, even after your muscles get tired. Exercise E: Scapular depression and adduction  1. Sit on a stable chair. Support your arms in front of you with pillows, armrests, or a tabletop. Keep your elbows in line with the sides of your body. 2. Gently move your shoulder blades down toward your middle back. Relax the muscles on the tops of your shoulders and in the back of your neck. 3. Hold for 3 seconds. 4. Slowly release the tension and relax your muscles completely before doing this exercise again. Repeat for a total of 10 repetitions. 5. After you have practiced this exercise, try doing the exercise without the arm support. Then, try the exercise while standing instead of sitting. Repeat 2 times. Complete this exercise 3 times per week.  Exercise F: Shoulder abduction, isometric    1. Stand or sit about 4-6 inches (10-15 cm) from a wall with your left / right side facing the wall. 2. Bend your left / right elbow and gently press your elbow against the wall. 3. Increase the pressure slowly until you are pressing as hard as you can without shrugging your shoulder. 4. Hold for 3 seconds. 5. Slowly release the tension and relax your muscles completely. Repeat for a total of 10 repetitions. Repeat 2 times. Complete this exercise 3 times per week.  Exercise G: Shoulder flexion, isometric    1. Stand or sit about 4-6 inches (10-15 cm) away from a wall with your left / right side facing the wall. 2. Keep your left / right elbow straight and gently press the top of your fist against  the wall. Increase the pressure slowly until you are pressing as hard as you can without shrugging your shoulder. 3. Hold for 10-15 seconds. 4. Slowly release the tension and relax your muscles completely. Repeat for a total of 10 repetitions. Repeat 2 times. Complete this exercise 3 times per week.  Exercise H: Internal rotation    1. Sit in a stable chair without armrests, or stand. Secure an exercise band at your left / right side, at elbow height. 2. Place a soft object, such as a folded towel or a small pillow, under your left / right upper arm so your elbow is a few inches (about 8 cm) away from your side. 3. Hold the end of the exercise band so the band stretches.  4. Keeping your elbow pressed against the soft object under your arm, move your forearm across your body toward your abdomen. Keep your body steady so the movement is only coming from your shoulder. 5. Hold for 3 seconds. 6. Slowly return to the starting position. Repeat for a total of 10 repetitions. Repeat 2 times. Complete this exercise 3 times per week.  Exercise I: External rotation    1. Sit in a stable chair without armrests, or stand. 2. Secure an exercise band at your left / right side, at elbow height. 3. Place a soft object, such as a folded towel or a small pillow, under your left / right upper arm so your elbow is a few inches (about 8 cm) away from your side. 4. Hold the end of the exercise band so the band stretches. 5. Keeping your elbow pressed against the soft object under your arm, move your forearm out, away from your abdomen. Keep your body steady so the movement is only coming from your shoulder. 6. Hold for 3 seconds. 7. Slowly return to the starting position. Repeat for a total of 10 repetitions. Repeat 2 times. Complete this exercise 3 times per week. Exercise J: Shoulder extension  1. Sit in a stable chair without armrests, or stand. Secure an exercise band to a stable object in front of you so  the band is at shoulder height. 2. Hold one end of the exercise band in each hand. Your palms should face each other. 3. Straighten your elbows and lift your hands up to shoulder height. 4. Step back, away from the secured end of the exercise band, until the band stretches. 5. Squeeze your shoulder blades together and pull your hands down to the sides of your thighs. Stop when your hands are straight down by your sides. Do not let your hands go behind your body. 6. Hold for 3 seconds. 7. Slowly return to the starting position. Repeat for a total of 10 repetitions. Repeat 2 times. Complete this exercise 3 times per week.  Exercise K: Shoulder extension, prone    1. Lie on your abdomen on a firm surface so your left / right arm hangs over the edge. 2. Hold a 5 lb weight in your hand so your palm faces in toward your body. Your arm should be straight. 3. Squeeze your shoulder blade down toward the middle of your back. 4. Slowly raise your arm behind you, up to the height of the surface that you are lying on. Keep your arm straight. 5. Hold for 3 seconds. 6. Slowly return to the starting position and relax your muscles. Repeat for a total of 10 repetitions. Repeat 2 times. Complete this exercise 3 times per week.   Exercise L: Horizontal abduction, prone  1. Lie on your abdomen on a firm surface so your left / right arm hangs over the edge. 2. Hold a 5 lb weight in your hand so your palm faces toward your feet. Your arm should be straight. 3. Squeeze your shoulder blade down toward the middle of your back. 4. Bend your elbow so your hand moves up, until your elbow is bent to an "L" shape (90 degrees). With your elbow bent, slowly move your forearm forward and up. Raise your hand up to the height of the surface that you are lying on. ? Your upper arm should not move, and your elbow should stay bent. ? At the top of the movement, your palm should face the floor. 5.  Hold for 3  seconds. 6. Slowly return to the starting position and relax your muscles. Repeat for a total of 10 repetitions. Repeat 2 times. Complete this exercise 3 times per week.  Exercise M: Horizontal abduction, standing  1. Sit on a stable chair, or stand. 2. Secure an exercise band to a stable object in front of you so the band is at shoulder height. 3. Hold one end of the exercise band in each hand. 4. Straighten your elbows and lift your hands straight in front of you, up to shoulder height. Your palms should face down, toward the floor. 5. Step back, away from the secured end of the exercise band, until the band stretches. 6. Move your arms out to your sides, and keep your arms straight. 7. Hold for 3 seconds. 8. Slowly return to the starting position. Repeat for a total of 10 repetitions. Repeat 2 times. Complete this exercise 3 times per week.  Exercise N: Scapular retraction and elevation  1. Sit on a stable chair, or stand. 2. Secure an exercise band to a stable object in front of you so the band is at shoulder height. 3. Hold one end of the exercise band in each hand. Your palms should face each other. 4. Sit in a stable chair without armrests, or stand. 5. Step back, away from the secured end of the exercise band, until the band stretches. 6. Squeeze your shoulder blades together and lift your hands over your head. Keep your elbows straight. 7. Hold for 3 seconds. 8. Slowly return to the starting position. Repeat for a total of 10 repetitions. Repeat 2 times. Complete this exercise 3 times per week.  This information is not intended to replace advice given to you by your health care provider. Make sure you discuss any questions you have with your health care provider. Document Released: 06/13/2005 Document Revised: 02/18/2016 Document Reviewed: 04/30/2015 Elsevier Interactive Patient Education  2017 [employer].

## 2019-09-23 NOTE — Progress Notes (Signed)
Chief Complaint  Patient presents with  . Annual Exam    Well Male Darius Mitchell is here for a complete physical.   His last physical was >1 year ago.  Current diet: in general, a "healthy" diet.   Current exercise: stretching, active in yard/at work; walking Weight trend: stable Daytime fatigue? No. Seat belt? Yes.    Health maintenance Shingrix- No Colonoscopy- Yes Tetanus- Yes Hep C- Yes Pneumonia vaccine- Yes AAA screening- Yes  Past Medical History:  Diagnosis Date  . Anxiety   . Appendicitis   . GERD (gastroesophageal reflux disease)   . Hyperlipidemia   . Hypertension   . Hypothyroidism      Past Surgical History:  Procedure Laterality Date  . APPENDECTOMY  1971  . WISDOM TOOTH EXTRACTION  1972    Medications  Current Outpatient Medications on File Prior to Visit  Medication Sig Dispense Refill  . atorvastatin (LIPITOR) 80 MG tablet TAKE 1 TABLET BY MOUTH  DAILY 90 tablet 3  . ELIQUIS 5 MG TABS tablet TAKE 1 TABLET BY MOUTH TWICE DAILY FOR 30 DAYS 60 tablet 2  . hydrochlorothiazide (HYDRODIURIL) 25 MG tablet Take 1 tablet (25 mg total) by mouth daily. 30 tablet 3  . levothyroxine (SYNTHROID) 125 MCG tablet Take 1 tablet (125 mcg total) by mouth daily before breakfast. 30 tablet 3  . losartan (COZAAR) 100 MG tablet TAKE 1 TABLET BY MOUTH  DAILY 90 tablet 3  . metoprolol succinate (TOPROL-XL) 25 MG 24 hr tablet TAKE 1 TABLET BY MOUTH ONCE DAILY 90 tablet 1  . Multiple Vitamins-Minerals (CENTRUM SILVER ULTRA MENS) TABS Take by mouth daily.    Marland Kitchen omeprazole (PRILOSEC) 40 MG capsule TAKE 1 CAPSULE BY MOUTH  DAILY 90 capsule 3  . PARoxetine (PAXIL) 30 MG tablet TAKE 1 TABLET BY MOUTH ONCE DAILY 90 tablet 3  . sildenafil (VIAGRA) 100 MG tablet Take 0.5-1 tablets (50-100 mg total) by mouth daily as needed for erectile dysfunction. 10 tablet 0   Allergies Allergies  Allergen Reactions  . Codeine Nausea Only  . Niacin And Related     Family History Family  History  Problem Relation Age of Onset  . Stroke Mother 11       Deceased  . Heart attack Mother   . Lymphoma Father 32       Deceased  . Heart disease Father   . Stroke Maternal Grandmother   . Stomach cancer Paternal Grandfather   . Healthy Brother        x2  . Healthy Sister   . Hypertension Daughter   . Healthy Daughter     Review of Systems: Constitutional:  no fevers Eye:  no recent significant change in vision Ears:  No changes in hearing Nose/Mouth/Throat:  no complaints of nasal congestion, no sore throat Cardiovascular: no chest pain Respiratory:  No shortness of breath Gastrointestinal:  No change in bowel habits GU:  No frequency Integumentary:  no abnormal skin lesions reported Neurologic: + Tingling in left upper extremity that is improving Endocrine:  denies unexplained weight changes  Exam BP 132/84 (BP Location: Right Arm, Patient Position: Sitting, Cuff Size: Normal)   Pulse 60   Temp (!) 95.1 F (35.1 C) (Temporal)   Ht 5\' 11"  (1.803 m)   Wt 214 lb (97.1 kg)   SpO2 98%   BMI 29.85 kg/m  General:  well developed, well nourished, in no apparent distress Skin:  no significant moles, warts, or growths Head:  no  masses, lesions, or tenderness Eyes:  pupils equal and round, sclera anicteric without injection Ears:  canals without lesions, TMs shiny without retraction, no obvious effusion, no erythema Nose:  nares patent, septum midline, mucosa normal Throat/Pharynx:  lips and gingiva without lesion; tongue and uvula midline; non-inflamed pharynx; no exudates or postnasal drainage Lungs:  clear to auscultation, breath sounds equal bilaterally, no respiratory distress Cardio:  regular rate and rhythm, no LE edema or bruits Rectal: Deferred GI: BS+, S, NT, ND, no masses or organomegaly Musculoskeletal: Left trapezius is tighter than the right; symmetrical muscle groups noted without atrophy or deformity Neuro:  gait normal; deep tendon reflexes normal and  symmetric; negative Spurling's bilaterally Psych: well oriented with normal range of affect and appropriate judgment/insight  Assessment and Plan  Well adult exam - Plan: CBC, Comprehensive metabolic panel, Lipid panel  Hypothyroidism, unspecified type - Plan: TSH, T4, free   Well 69 y.o. male. Counseled on diet and exercise. Other orders as above. Follow up in 6 mo or prn.  He will need to follow-up for labs in 3 weeks.  Our office recently increased his levothyroxine based off of testing 3 weeks ago.  Needs to be a full 6 weeks from change. The patient voiced understanding and agreement to the plan.  [place], DO 09/23/19 11:46 AM

## 2019-10-02 MED FILL — LEVOTHYROXINE SODIUM 125 MC: 125 | 30 days supply | Qty: 30 | Fill #1

## 2019-10-11 MED FILL — HYDROCHLOROTHIAZIDE 25 MG T: 25 | 30 days supply | Qty: 30 | Fill #1

## 2019-10-18 ENCOUNTER — Ambulatory Visit: Payer: 59 | Admitting: Family Medicine

## 2019-10-22 MED FILL — METOPROLOL SUCCINATE ER 25: 25 | 30 days supply | Qty: 30 | Fill #4

## 2019-10-24 ENCOUNTER — Other Ambulatory Visit (INDEPENDENT_AMBULATORY_CARE_PROVIDER_SITE_OTHER): Payer: 59

## 2019-10-24 ENCOUNTER — Other Ambulatory Visit: Payer: Self-pay

## 2019-10-24 DIAGNOSIS — Z0000 Encounter for general adult medical examination without abnormal findings: Secondary | ICD-10-CM | POA: Diagnosis not present

## 2019-10-24 DIAGNOSIS — E039 Hypothyroidism, unspecified: Secondary | ICD-10-CM | POA: Diagnosis not present

## 2019-10-24 LAB — CBC
HCT: 40.2 % (ref 39.0–52.0)
Hemoglobin: 14.1 g/dL (ref 13.0–17.0)
MCHC: 35 g/dL (ref 30.0–36.0)
MCV: 93.4 fl (ref 78.0–100.0)
Platelets: 165 10*3/uL (ref 150.0–400.0)
RBC: 4.3 Mil/uL (ref 4.22–5.81)
RDW: 13.5 % (ref 11.5–15.5)
WBC: 6.5 10*3/uL (ref 4.0–10.5)

## 2019-10-24 LAB — LDL CHOLESTEROL, DIRECT: Direct LDL: 94 mg/dL

## 2019-10-24 LAB — COMPREHENSIVE METABOLIC PANEL
ALT: 19 U/L (ref 0–53)
AST: 20 U/L (ref 0–37)
Albumin: 4.4 g/dL (ref 3.5–5.2)
Alkaline Phosphatase: 65 U/L (ref 39–117)
BUN: 21 mg/dL (ref 6–23)
CO2: 29 mEq/L (ref 19–32)
Calcium: 9.3 mg/dL (ref 8.4–10.5)
Chloride: 103 mEq/L (ref 96–112)
Creatinine, Ser: 0.96 mg/dL (ref 0.40–1.50)
GFR: 77.26 mL/min (ref >60.00)
Glucose, Bld: 110 mg/dL — ABNORMAL HIGH (ref 70–99)
Potassium: 3.7 mEq/L (ref 3.5–5.1)
Sodium: 141 mEq/L (ref 135–145)
Total Bilirubin: 0.6 mg/dL (ref 0.2–1.2)
Total Protein: 7 g/dL (ref 6.0–8.3)

## 2019-10-24 LAB — TSH: TSH: 4.16 u[IU]/mL (ref 0.35–4.50)

## 2019-10-24 LAB — LIPID PANEL
Cholesterol: 184 mg/dL (ref 0–200)
HDL: 31.2 mg/dL — ABNORMAL LOW (ref >39.00)
NonHDL: 152.92
Total CHOL/HDL Ratio: 6
Triglycerides: 273 mg/dL — ABNORMAL HIGH (ref 0.0–149.0)
VLDL: 54.6 mg/dL — ABNORMAL HIGH (ref 0.0–40.0)

## 2019-10-24 LAB — T4, FREE: Free T4: 0.86 ng/dL (ref 0.60–1.60)

## 2019-10-25 ENCOUNTER — Other Ambulatory Visit (INDEPENDENT_AMBULATORY_CARE_PROVIDER_SITE_OTHER): Payer: 59

## 2019-10-25 DIAGNOSIS — R739 Hyperglycemia, unspecified: Secondary | ICD-10-CM | POA: Diagnosis not present

## 2019-10-25 LAB — HEMOGLOBIN A1C: Hgb A1c MFr Bld: 5.6 % (ref 4.6–6.5)

## 2019-10-30 MED FILL — LEVOTHYROXINE SODIUM 125 MC: 125 | 30 days supply | Qty: 30 | Fill #2

## 2019-10-30 MED FILL — ELIQUIS 5 MG TABLET: 5 | 30 days supply | Qty: 60 | Fill #2

## 2019-11-11 ENCOUNTER — Encounter: Payer: Self-pay | Admitting: Cardiology

## 2019-11-11 ENCOUNTER — Other Ambulatory Visit: Payer: Self-pay

## 2019-11-11 ENCOUNTER — Ambulatory Visit (INDEPENDENT_AMBULATORY_CARE_PROVIDER_SITE_OTHER): Payer: 59 | Admitting: Cardiology

## 2019-11-11 VITALS — BP 108/68 | HR 56 | Ht 71.0 in | Wt 211.0 lb

## 2019-11-11 DIAGNOSIS — E782 Mixed hyperlipidemia: Secondary | ICD-10-CM | POA: Diagnosis not present

## 2019-11-11 DIAGNOSIS — I480 Paroxysmal atrial fibrillation: Secondary | ICD-10-CM | POA: Diagnosis not present

## 2019-11-11 DIAGNOSIS — I10 Essential (primary) hypertension: Secondary | ICD-10-CM

## 2019-11-11 MED FILL — HYDROCHLOROTHIAZIDE 25 MG T: 25 | 30 days supply | Qty: 30 | Fill #2

## 2019-11-11 NOTE — Progress Notes (Signed)
Cardiology Office Note:    Date:  11/11/2019   ID:  Darius Mitchell, DOB 20-Dec-1950, MRN [id number]  PCP:  [name], DO  Cardiologist:  Jenne Campus, MD    Referring MD: [name]*   Chief Complaint  Patient presents with  . Follow-up  M doing well  History of Present Illness:    Darius Mitchell is a 69 y.o. male with past medical history significant for paroxysmal atrial fibrillation, essential hypertension, ascending aortic aneurysm measuring 40 mm based on last echocardiogram from 2020, mild aortic insufficiency.  Comes today to my office for follow-up.  Overall doing very well.  Getting ready to retire.  He is 69 years .  Denies have any chest pain tightness squeezing pressure burning in the chest.  Overall doing very well working the garden as well as playing golf enjoying it.  Past Medical History:  Diagnosis Date  . Anxiety   . Anxiety and depression 03/25/2014  . Appendicitis   . BMI 30.0-30.9,adult 05/15/2015  . Carpal tunnel syndrome of right wrist 10/23/2015  . Diverticulosis of colon without hemorrhage 03/17/2016  . Essential hypertension, benign 03/25/2014  . GERD (gastroesophageal reflux disease)   . Hyperlipidemia   . Hypertension   . Hypothyroidism   . Low testosterone 05/15/2015  . Paroxysmal atrial fibrillation ([place]) 12/03/2018  . Prostate cancer screening encounter, options and risks discussed 04/30/2014  . Screening for ischemic heart disease 04/30/2014  . Swelling of both lower extremities 11/29/2017  . Visit for preventive health examination 04/30/2014    Past Surgical History:  Procedure Laterality Date  . APPENDECTOMY  1971  . WISDOM TOOTH EXTRACTION  1972    Current Medications: Current Meds  Medication Sig  . atorvastatin (LIPITOR) 80 MG tablet TAKE 1 TABLET BY MOUTH  DAILY  . ELIQUIS 5 MG TABS tablet TAKE 1 TABLET BY MOUTH TWICE DAILY FOR 30 DAYS  . hydrochlorothiazide (HYDRODIURIL) 25 MG tablet Take 1 tablet (25 mg  total) by mouth daily.  Marland Kitchen levothyroxine (SYNTHROID) 125 MCG tablet Take 1 tablet (125 mcg total) by mouth daily before breakfast.  . losartan (COZAAR) 100 MG tablet TAKE 1 TABLET BY MOUTH  DAILY  . metoprolol succinate (TOPROL-XL) 25 MG 24 hr tablet TAKE 1 TABLET BY MOUTH ONCE DAILY  . Multiple Vitamins-Minerals (CENTRUM SILVER ULTRA MENS) TABS Take by mouth daily.  Marland Kitchen omeprazole (PRILOSEC) 40 MG capsule TAKE 1 CAPSULE BY MOUTH  DAILY  . PARoxetine (PAXIL) 30 MG tablet TAKE 1 TABLET BY MOUTH ONCE DAILY     Allergies:   Codeine and Niacin and related   Social History   Socioeconomic History  . Marital status: Married    Spouse name: Not on file  . Number of children: Not on file  . Years of education: Not on file  . Highest education level: Not on file  Occupational History  . Not on file  Tobacco Use  . Smoking status: Former Smoker    Packs/day: 0.25    Years: 25.00    Pack years: 6.25    Types: Cigarettes    Quit date: 05/28/2019    Years since quitting: 0.4  . Smokeless tobacco: Never Used  Substance and Sexual Activity  . Alcohol use: Never  . Drug use: Never  . Sexual activity: Not on file  Other Topics Concern  . Not on file  Social History Narrative  . Not on file   Social Determinants of Health   Financial Resource Strain:   .  Difficulty of Paying Living Expenses:   Food Insecurity:   . Worried About Charity fundraiser in the Last Year:   . Arboriculturist in the Last Year:   Transportation Needs:   . Film/video editor (Medical):   Marland Kitchen Lack of Transportation (Non-Medical):   Physical Activity:   . Days of Exercise per Week:   . Minutes of Exercise per Session:   Stress:   . Feeling of Stress :   Social Connections:   . Frequency of Communication with Friends and Family:   . Frequency of Social Gatherings with Friends and Family:   . Attends Religious Services:   . Active Member of Clubs or Organizations:   . Attends Archivist Meetings:     Marland Kitchen Marital Status:      Family History: The patient's family history includes Healthy in his brother, daughter, and sister; Heart attack in his mother; Heart disease in his father; Hypertension in his daughter; Lymphoma (age of onset: 13) in his father; Stomach cancer in his paternal grandfather; Stroke in his maternal grandmother; Stroke (age of onset: 67) in his mother. ROS:   Please see the history of present illness.    All 14 point review of systems negative except as described per history of present illness  EKGs/Labs/Other Studies Reviewed:      Recent Labs: 11/23/2018: Magnesium 2.0 10/24/2019: ALT 19; BUN 21; Creatinine, Ser 0.96; Hemoglobin 14.1; Platelets 165.0; Potassium 3.7; Sodium 141; TSH 4.16  Recent Lipid Panel    Component Value Date/Time   CHOL 184 10/24/2019 0733   TRIG 273.0 (H) 10/24/2019 0733   HDL 31.20 (L) 10/24/2019 0733   CHOLHDL 6 10/24/2019 0733   VLDL 54.6 (H) 10/24/2019 0733   LDLCALC 104 (H) 05/24/2017 0803   LDLDIRECT 94.0 10/24/2019 0733    Physical Exam:    VS:  BP 108/68   Pulse (!) 56   Ht 5\' 11"  (1.803 m)   Wt 211 lb (95.7 kg)   SpO2 97%   BMI 29.43 kg/m     Wt Readings from Last 3 Encounters:  11/11/19 211 lb (95.7 kg)  09/23/19 214 lb (97.1 kg)  08/12/19 216 lb 4 oz (98.1 kg)     GEN:  Well nourished, well developed in no acute distress HEENT: Normal NECK: No JVD; No carotid bruits LYMPHATICS: No lymphadenopathy CARDIAC: RRR, no murmurs, no rubs, no gallops RESPIRATORY:  Clear to auscultation without rales, wheezing or rhonchi  ABDOMEN: Soft, non-tender, non-distended MUSCULOSKELETAL:  No edema; No deformity  SKIN: Warm and dry LOWER EXTREMITIES: no swelling NEUROLOGIC:  Alert and oriented x 3 PSYCHIATRIC:  Normal affect   ASSESSMENT:    1. Paroxysmal atrial fibrillation (HCC)   2. Essential hypertension, benign   3. Mixed hyperlipidemia    PLAN:    In order of problems listed above:  1. Paroxysmal atrial  fibrillation.  Denies having any palpitations.  He is anticoagulated with Eliquis 5 mg twice daily which I will continue. 2. Essential hypertension blood pressure excellently controlled his blood pressure is 108/68. 3. Mixed dyslipidemia.  He is taking 80 mg of Lipitor which is high intensity statin.  In spite of that his LDL is 94.  I suspect he may have familial hyperlipidemia.  Now I talked to him about the importance of checking cholesterol and all his family members.  That being already checked and then managed appropriately with statin. 4. Ascending aortic aneurysm measuring 40 mm.  Echocardiogram will be  done to recheck it. 5. We talked about healthy lifestyle which he is already doing.  Good diet.   Medication Adjustments/Labs and Tests Ordered: Current medicines are reviewed at length with the patient today.  Concerns regarding medicines are outlined above.  No orders of the defined types were placed in this encounter.  Medication changes: No orders of the defined types were placed in this encounter.   Signed, Park Liter, MD, [facility] 11/11/2019 8:45 AM    [place]

## 2019-11-11 NOTE — Patient Instructions (Signed)
Medication Instructions:  °Your physician recommends that you continue on your current medications as directed. Please refer to the Current Medication list given to you today. ° °*If you need a refill on your cardiac medications before your next appointment, please call your pharmacy* ° ° °Lab Work: °None. ° °If you have labs (blood work) drawn today and your tests are completely normal, you will receive your results only by: °• MyChart Message (if you have MyChart) OR °• A paper copy in the mail °If you have any lab test that is abnormal or we need to change your treatment, we will call you to review the results. ° ° °Testing/Procedures: °Your physician has requested that you have an echocardiogram. Echocardiography is a painless test that uses sound waves to create images of your heart. It provides your doctor with information about the size and shape of your heart and how well your heart’s chambers and valves are working. This procedure takes approximately one hour. There are no restrictions for this procedure. ° ° ° ° °Follow-Up: °At [facility], you and your health needs are our priority.  As part of our continuing mission to provide you with exceptional heart care, we have created designated Provider Care Teams.  These Care Teams include your primary Cardiologist (physician) and Advanced Practice Providers (APPs -  Physician Assistants and Nurse Practitioners) who all work together to provide you with the care you need, when you need it. ° °We recommend signing up for the patient portal called "MyChart".  Sign up information is provided on this After Visit Summary.  MyChart is used to connect with patients for Virtual Visits (Telemedicine).  Patients are able to view lab/test results, encounter notes, upcoming appointments, etc.  Non-urgent messages can be sent to your provider as well.   °To learn more about what you can do with MyChart, go to [email or web address].   ° °Your next appointment:   °6  month(s) ° °The format for your next appointment:   °In Person ° °Provider:   °Robert Krasowski, MD ° ° °Other Instructions ° ° °Echocardiogram °An echocardiogram is a procedure that uses painless sound waves (ultrasound) to produce an image of the heart. Images from an echocardiogram can provide important information about: °· Signs of coronary artery disease (CAD). °· Aneurysm detection. An aneurysm is a weak or damaged part of an artery wall that bulges out from the normal force of blood pumping through the body. °· Heart size and shape. Changes in the size or shape of the heart can be associated with certain conditions, including heart failure, aneurysm, and CAD. °· Heart muscle function. °· Heart valve function. °· Signs of a past heart attack. °· Fluid buildup around the heart. °· Thickening of the heart muscle. °· A tumor or infectious growth around the heart valves. °Tell a health care provider about: °· Any allergies you have. °· All medicines you are taking, including vitamins, herbs, eye drops, creams, and over-the-counter medicines. °· Any blood disorders you have. °· Any surgeries you have had. °· Any medical conditions you have. °· Whether you are pregnant or may be pregnant. °What are the risks? °Generally, this is a safe procedure. However, problems may occur, including: °· Allergic reaction to dye (contrast) that may be used during the procedure. °What happens before the procedure? °No specific preparation is needed. You may eat and drink normally. °What happens during the procedure? ° °· An IV tube may be inserted into one of your veins. °· You may   receive contrast through this tube. A contrast is an injection that improves the quality of the pictures from your heart. °· A gel will be applied to your chest. °· A wand-like tool (transducer) will be moved over your chest. The gel will help to transmit the sound waves from the transducer. °· The sound waves will harmlessly bounce off of your heart to  allow the heart images to be captured in real-time motion. The images will be recorded on a computer. °The procedure may vary among health care providers and hospitals. °What happens after the procedure? °· You may return to your normal, everyday life, including diet, activities, and medicines, unless your health care provider tells you not to do that. °Summary °· An echocardiogram is a procedure that uses painless sound waves (ultrasound) to produce an image of the heart. °· Images from an echocardiogram can provide important information about the size and shape of your heart, heart muscle function, heart valve function, and fluid buildup around your heart. °· You do not need to do anything to prepare before this procedure. You may eat and drink normally. °· After the echocardiogram is completed, you may return to your normal, everyday life, unless your health care provider tells you not to do that. °This information is not intended to replace advice given to you by your health care provider. Make sure you discuss any questions you have with your health care provider. °Document Revised: 10/04/2018 Document Reviewed: 07/16/2016 °Elsevier Patient Education © 2020 [employer] Inc[employer] ° ° °

## 2019-11-18 ENCOUNTER — Ambulatory Visit (HOSPITAL_BASED_OUTPATIENT_CLINIC_OR_DEPARTMENT_OTHER)
Admission: RE | Admit: 2019-11-18 | Discharge: 2019-11-18 | Disposition: A | Discharge status: Home / Self Care | Payer: 59 | Source: Ambulatory Visit | Attending: Cardiology | Admitting: Cardiology

## 2019-11-18 DIAGNOSIS — I480 Paroxysmal atrial fibrillation: Secondary | ICD-10-CM

## 2019-11-18 NOTE — Progress Notes (Signed)
  Echocardiogram 2D Echocardiogram has been performed.  Darius Mitchell A Darius Mitchell 11/18/2019, 8:50 AM

## 2019-11-20 MED FILL — METOPROLOL SUCCINATE ER 25: 25 | 30 days supply | Qty: 30 | Fill #5

## 2019-11-28 MED FILL — LEVOTHYROXINE SODIUM 125 MC: 125 | 30 days supply | Qty: 30 | Fill #3

## 2019-12-10 MED FILL — HYDROCHLOROTHIAZIDE 25 MG T: 25 | 30 days supply | Qty: 30 | Fill #3

## 2019-12-18 ENCOUNTER — Other Ambulatory Visit: Payer: Self-pay | Admitting: Cardiology

## 2019-12-18 MED FILL — METOPROLOL SUCCINATE ER 25: 25 | 30 days supply | Qty: 30 | Fill #0

## 2020-01-01 ENCOUNTER — Other Ambulatory Visit: Payer: Self-pay | Admitting: Cardiology

## 2020-01-01 ENCOUNTER — Other Ambulatory Visit: Payer: Self-pay | Admitting: Internal Medicine

## 2020-01-01 MED FILL — ELIQUIS 5 MG TABLET: 5 | 30 days supply | Qty: 60 | Fill #0

## 2020-01-01 MED FILL — LEVOTHYROXINE SODIUM 125 MC: 125 | 30 days supply | Qty: 30 | Fill #0

## 2020-01-13 ENCOUNTER — Other Ambulatory Visit: Payer: Self-pay | Admitting: Internal Medicine

## 2020-01-13 MED FILL — HYDROCHLOROTHIAZIDE 25 MG T: 25 | 30 days supply | Qty: 30 | Fill #0

## 2020-01-17 ENCOUNTER — Other Ambulatory Visit: Payer: Self-pay | Admitting: Family Medicine

## 2020-01-20 MED FILL — METOPROLOL SUCCINATE ER 25: 25 | 30 days supply | Qty: 30 | Fill #1

## 2020-02-03 MED FILL — LEVOTHYROXINE SODIUM 125 MC: 125 | 30 days supply | Qty: 30 | Fill #1

## 2020-02-11 MED FILL — HYDROCHLOROTHIAZIDE 25 MG T: 25 | 30 days supply | Qty: 30 | Fill #1

## 2020-02-11 MED FILL — ELIQUIS 5 MG TABLET: 5 | 30 days supply | Qty: 60 | Fill #1

## 2020-02-12 ENCOUNTER — Other Ambulatory Visit: Payer: Self-pay | Admitting: Family Medicine

## 2020-02-18 MED FILL — METOPROLOL SUCCINATE ER 25: 25 | 30 days supply | Qty: 30 | Fill #2

## 2020-03-03 MED FILL — LEVOTHYROXINE SODIUM 125 MC: 125 | 30 days supply | Qty: 30 | Fill #2

## 2020-03-11 MED FILL — HYDROCHLOROTHIAZIDE 25 MG T: 25 | 30 days supply | Qty: 30 | Fill #2

## 2020-03-16 MED FILL — LEVOTHYROXINE SODIUM 125 MC: 125 | 30 days supply | Qty: 30 | Fill #3

## 2020-03-16 MED FILL — HYDROCHLOROTHIAZIDE 25 MG T: 25 | 30 days supply | Qty: 30 | Fill #3

## 2020-03-16 MED FILL — METOPROLOL SUCCINATE ER 25: 25 | 30 days supply | Qty: 30 | Fill #3

## 2020-03-25 ENCOUNTER — Ambulatory Visit: Payer: 59 | Admitting: Family Medicine

## 2020-03-31 ENCOUNTER — Other Ambulatory Visit: Payer: Self-pay

## 2020-03-31 ENCOUNTER — Ambulatory Visit (INDEPENDENT_AMBULATORY_CARE_PROVIDER_SITE_OTHER): Payer: 59 | Admitting: Family Medicine

## 2020-03-31 ENCOUNTER — Encounter: Payer: Self-pay | Admitting: Family Medicine

## 2020-03-31 ENCOUNTER — Other Ambulatory Visit: Payer: Self-pay | Admitting: Family Medicine

## 2020-03-31 VITALS — BP 122/80 | HR 58 | Temp 98.2°F | Ht 71.0 in | Wt 200.1 lb

## 2020-03-31 DIAGNOSIS — F419 Anxiety disorder, unspecified: Secondary | ICD-10-CM | POA: Diagnosis not present

## 2020-03-31 DIAGNOSIS — N529 Male erectile dysfunction, unspecified: Secondary | ICD-10-CM

## 2020-03-31 DIAGNOSIS — E039 Hypothyroidism, unspecified: Secondary | ICD-10-CM | POA: Diagnosis not present

## 2020-03-31 DIAGNOSIS — F32A Depression, unspecified: Secondary | ICD-10-CM

## 2020-03-31 DIAGNOSIS — I10 Essential (primary) hypertension: Secondary | ICD-10-CM | POA: Diagnosis not present

## 2020-03-31 DIAGNOSIS — E782 Mixed hyperlipidemia: Secondary | ICD-10-CM | POA: Diagnosis not present

## 2020-03-31 MED ORDER — SILDENAFIL CITRATE 100 MG PO TABS: 50.0000 mg | ORAL_TABLET | Freq: Every day | ORAL | 5 refills | Status: DC | PRN

## 2020-03-31 MED FILL — SILDENAFIL CITRATE 100 MG T: 100 | 23 days supply | Qty: 6 | Fill #0

## 2020-03-31 NOTE — Progress Notes (Signed)
Chief Complaint  Patient presents with  . Follow-up    Subjective Darius Mitchell is a 69 y.o. male who presents for hypertension follow up. He does monitor home blood pressures. Blood pressures ranging from 120's/70-80's on average. He is compliant with medications- losartan 100 mg/d, Toprol XL 25 mg/d, HCTZ 25 mg/d Patient has these side effects of medication: none He is adhering to a healthy diet overall. Current exercise: walking  Hyperlipidemia Patient presents for dyslipidemia follow up. Currently being treated with Lipitor 80 mg daily and compliance with treatment thus far has been good.  He denies myalgias. Diet and exercise as above. The patient is not known to have coexisting coronary artery disease.  Hypothyroidism Patient presents for follow-up of hypothyroidism.  Reports compliance with medication-levothyroxine 125 mcg daily. Current symptoms include: denies fatigue, weight changes, heat/cold intolerance, bowel/skin changes or CVS symptoms He believes his dose should be unchanged  Anxiety and depression Currently takes Paxil 30 mg daily and reports compliance without adverse effects. Symptoms are controlled. No homicidal or suicidal ideations. No self-medication with illicit or prescription medications. He is not currently following with a counselor or psychologist.   Past Medical History:  Diagnosis Date  . Anxiety and depression 03/25/2014  . BMI 30.0-30.9,adult 05/15/2015  . Carpal tunnel syndrome of right wrist 10/23/2015  . Diverticulosis of colon without hemorrhage 03/17/2016  . Essential hypertension, benign 03/25/2014  . GERD (gastroesophageal reflux disease)   . Hyperlipidemia   . Hypothyroidism   . Low testosterone 05/15/2015  . Paroxysmal atrial fibrillation ([place]) 12/03/2018  . Prostate cancer screening encounter, options and risks discussed 04/30/2014  . Screening for ischemic heart disease 04/30/2014  . Swelling of both lower extremities 11/29/2017  .  Visit for preventive health examination 04/30/2014    Exam BP 122/80 (BP Location: Left Arm, Patient Position: Sitting, Cuff Size: Normal)   Pulse (!) 58   Temp 98.2 F (36.8 C) (Oral)   Ht 5\' 11"  (1.803 m)   Wt 200 lb 2 oz (90.8 kg)   SpO2 98%   BMI 27.91 kg/m  General:  well developed, well nourished, in no apparent distress Heart: RRR, no bruits, no LE edema Lungs: clear to auscultation, no accessory muscle use Psych: well oriented with normal range of affect and appropriate judgment/insight  Essential hypertension, benign  Mixed hyperlipidemia - Plan: Lipid panel, Comprehensive metabolic panel  Hypothyroidism, unspecified type - Plan: TSH, T4, free  Anxiety and depression  Erectile dysfunction, unspecified erectile dysfunction type - Plan: sildenafil (VIAGRA) 100 MG tablet  1.  Continue hydrochlorothiazide 25 mg daily, losartan 100 mg daily, Toprol-XL 25 mg daily.  Counseled on diet and exercise. 2.  Continue Lipitor 80 mg daily. 3.  Continue levothyroxine 125 mcg daily. 4.  Continue Paxil 30 mg daily. F/u in 6 month for CPE or prn. The patient voiced understanding and agreement to the plan.  [place], DO 03/31/20  9:18 AM

## 2020-03-31 NOTE — Patient Instructions (Signed)
Give us 2-3 business days to get the results of your labs back.   Keep the diet clean and stay active.  Let us know if you need anything. 

## 2020-04-01 LAB — COMPREHENSIVE METABOLIC PANEL
AG Ratio: 1.7 (calc) (ref 1.0–2.5)
ALT: 19 U/L (ref 9–46)
AST: 21 U/L (ref 10–35)
Albumin: 4.3 g/dL (ref 3.6–5.1)
Alkaline phosphatase (APISO): 67 U/L (ref 35–144)
BUN/Creatinine Ratio: 14 (calc) (ref 6–22)
BUN: 17 mg/dL (ref 7–25)
CO2: 28 mmol/L (ref 20–32)
Calcium: 9.4 mg/dL (ref 8.6–10.3)
Chloride: 105 mmol/L (ref 98–110)
Creat: 1.23 mg/dL — ABNORMAL HIGH (ref 0.70–1.18)
Globulin: 2.5 g/dL (calc) (ref 1.9–3.7)
Glucose, Bld: 87 mg/dL (ref 65–99)
Potassium: 4.2 mmol/L (ref 3.5–5.3)
Sodium: 142 mmol/L (ref 135–146)
Total Bilirubin: 0.7 mg/dL (ref 0.2–1.2)
Total Protein: 6.8 g/dL (ref 6.1–8.1)

## 2020-04-01 LAB — LIPID PANEL
Cholesterol: 171 mg/dL (ref <200)
HDL: 33 mg/dL — ABNORMAL LOW (ref >=40)
LDL Cholesterol (Calc): 112 mg/dL (calc) — ABNORMAL HIGH
Non-HDL Cholesterol (Calc): 138 mg/dL (calc) — ABNORMAL HIGH (ref <130)
Total CHOL/HDL Ratio: 5.2 (calc) — ABNORMAL HIGH (ref <5.0)
Triglycerides: 147 mg/dL (ref <150)

## 2020-04-01 LAB — T4, FREE: Free T4: 1.2 ng/dL (ref 0.8–1.8)

## 2020-04-01 LAB — TSH: TSH: 2.6 mIU/L (ref 0.40–4.50)

## 2020-04-16 ENCOUNTER — Other Ambulatory Visit: Payer: Self-pay | Admitting: Family Medicine

## 2020-04-16 DIAGNOSIS — F32A Depression, unspecified: Secondary | ICD-10-CM

## 2020-04-16 DIAGNOSIS — F419 Anxiety disorder, unspecified: Secondary | ICD-10-CM

## 2020-04-17 MED FILL — METOPROLOL SUCCINATE ER 25: 25 | 30 days supply | Qty: 30 | Fill #4

## 2020-04-29 MED FILL — LEVOTHYROXINE SODIUM 125 MC: 125 | 30 days supply | Qty: 30 | Fill #4

## 2020-04-29 MED FILL — ELIQUIS 5 MG TABLET: 5 | 30 days supply | Qty: 60 | Fill #2

## 2020-04-30 ENCOUNTER — Other Ambulatory Visit: Payer: Self-pay | Admitting: Family Medicine

## 2020-05-13 ENCOUNTER — Other Ambulatory Visit: Payer: Self-pay | Admitting: Family Medicine

## 2020-05-13 MED FILL — HYDROCHLOROTHIAZIDE 25 MG T: 25 | 30 days supply | Qty: 30 | Fill #0

## 2020-05-13 MED FILL — METOPROLOL SUCCINATE ER 25: 25 | 30 days supply | Qty: 30 | Fill #5

## 2020-05-18 ENCOUNTER — Encounter: Payer: Self-pay | Admitting: Cardiology

## 2020-05-18 ENCOUNTER — Other Ambulatory Visit: Payer: Self-pay | Admitting: Cardiology

## 2020-05-18 ENCOUNTER — Other Ambulatory Visit: Payer: Self-pay

## 2020-05-18 ENCOUNTER — Ambulatory Visit (INDEPENDENT_AMBULATORY_CARE_PROVIDER_SITE_OTHER): Payer: 59 | Admitting: Cardiology

## 2020-05-18 VITALS — BP 110/78 | HR 48 | Ht 71.0 in | Wt 201.0 lb

## 2020-05-18 DIAGNOSIS — I10 Essential (primary) hypertension: Secondary | ICD-10-CM | POA: Diagnosis not present

## 2020-05-18 DIAGNOSIS — E039 Hypothyroidism, unspecified: Secondary | ICD-10-CM | POA: Diagnosis not present

## 2020-05-18 DIAGNOSIS — I480 Paroxysmal atrial fibrillation: Secondary | ICD-10-CM

## 2020-05-18 DIAGNOSIS — K219 Gastro-esophageal reflux disease without esophagitis: Secondary | ICD-10-CM | POA: Diagnosis not present

## 2020-05-18 MED ORDER — EZETIMIBE 10 MG PO TABS: 10.0000 mg | ORAL_TABLET | Freq: Every day | ORAL | 1 refills | Status: DC

## 2020-05-18 MED FILL — EZETIMIBE 10 MG TABS: 10 | 30 days supply | Qty: 30 | Fill #0

## 2020-05-18 NOTE — Progress Notes (Signed)
Cardiology Office Note:    Date:  05/18/2020   ID:  Darius Mitchell, DOB 1950/10/13, MRN [id number]  PCP:  [name], DO  Cardiologist:  Jenne Campus, MD    Referring MD: [name]*   Chief Complaint  Patient presents with  . Follow-up  I am doing very well  History of Present Illness:    Darius Mitchell is a 69 y.o. male with past medical history significant for paroxysmal atrial fibrillation, draining sinus rhythm, ascending aortic enlargement but latest echocardiogram showed diameter of 36 mm, dyslipidemia, essential hypertension.  Comes today 2 months of follow-up.  Overall doing very well denies have any chest pain tightness squeezing pressure burning chest.  He is still working but planning to retire probably at the end of the year.  He does have significant plans for retirement so he should be easy.  Past Medical History:  Diagnosis Date  . Anxiety and depression 03/25/2014  . BMI 30.0-30.9,adult 05/15/2015  . Carpal tunnel syndrome of right wrist 10/23/2015  . Diverticulosis of colon without hemorrhage 03/17/2016  . Essential hypertension, benign 03/25/2014  . GERD (gastroesophageal reflux disease)   . Hyperlipidemia   . Hypothyroidism   . Low testosterone 05/15/2015  . Paroxysmal atrial fibrillation ([place]) 12/03/2018  . Prostate cancer screening encounter, options and risks discussed 04/30/2014  . Screening for ischemic heart disease 04/30/2014  . Swelling of both lower extremities 11/29/2017  . Visit for preventive health examination 04/30/2014    Past Surgical History:  Procedure Laterality Date  . APPENDECTOMY  1971  . WISDOM TOOTH EXTRACTION  1972    Current Medications: Current Meds  Medication Sig  . atorvastatin (LIPITOR) 80 MG tablet TAKE 1 TABLET BY MOUTH  DAILY  . ELIQUIS 5 MG TABS tablet TAKE 1 TABLET BY MOUTH TWICE DAILY FOR 30 DAYS  . hydrochlorothiazide (HYDRODIURIL) 25 MG tablet TAKE 1 TABLET BY MOUTH ONCE DAILY  .  levothyroxine (SYNTHROID) 125 MCG tablet Take 1 tablet (125 mcg total) by mouth daily before breakfast.  . losartan (COZAAR) 100 MG tablet TAKE 1 TABLET BY MOUTH  DAILY  . metoprolol succinate (TOPROL-XL) 25 MG 24 hr tablet TAKE 1 TABLET BY MOUTH ONCE DAILY  . Multiple Vitamins-Minerals (CENTRUM SILVER ULTRA MENS) TABS Take by mouth daily.  Marland Kitchen omeprazole (PRILOSEC) 40 MG capsule TAKE 1 CAPSULE BY MOUTH  DAILY  . PARoxetine (PAXIL) 30 MG tablet TAKE 1 TABLET BY MOUTH ONCE DAILY  . sildenafil (VIAGRA) 100 MG tablet Take 0.5-1 tablets (50-100 mg total) by mouth daily as needed for erectile dysfunction.     Allergies:   Codeine and Niacin and related   Social History   Socioeconomic History  . Marital status: Married    Spouse name: Not on file  . Number of children: Not on file  . Years of education: Not on file  . Highest education level: Not on file  Occupational History  . Not on file  Tobacco Use  . Smoking status: Former Smoker    Packs/day: 0.25    Years: 25.00    Pack years: 6.25    Types: Cigarettes    Quit date: 05/28/2019    Years since quitting: 0.9  . Smokeless tobacco: Never Used  Vaping Use  . Vaping Use: Never used  Substance and Sexual Activity  . Alcohol use: Never  . Drug use: Never  . Sexual activity: Not on file  Other Topics Concern  . Not on file  Social History  Narrative  . Not on file   Social Determinants of Health   Financial Resource Strain:   . Difficulty of Paying Living Expenses: Not on file  Food Insecurity:   . Worried About Charity fundraiser in the Last Year: Not on file  . Ran Out of Food in the Last Year: Not on file  Transportation Needs:   . Lack of Transportation (Medical): Not on file  . Lack of Transportation (Non-Medical): Not on file  Physical Activity:   . Days of Exercise per Week: Not on file  . Minutes of Exercise per Session: Not on file  Stress:   . Feeling of Stress : Not on file  Social Connections:   . Frequency  of Communication with Friends and Family: Not on file  . Frequency of Social Gatherings with Friends and Family: Not on file  . Attends Religious Services: Not on file  . Active Member of Clubs or Organizations: Not on file  . Attends Archivist Meetings: Not on file  . Marital Status: Not on file     Family History: The patient's family history includes Healthy in his brother, daughter, and sister; Heart attack in his mother; Heart disease in his father; Hypertension in his daughter; Lymphoma (age of onset: 32) in his father; Stomach cancer in his paternal grandfather; Stroke in his maternal grandmother; Stroke (age of onset: 61) in his mother. ROS:   Please see the history of present illness.    All 14 point review of systems negative except as described per history of present illness  EKGs/Labs/Other Studies Reviewed:      Recent Labs: 10/24/2019: Hemoglobin 14.1; Platelets 165.0 03/31/2020: ALT 19; BUN 17; Creat 1.23; Potassium 4.2; Sodium 142; TSH 2.60  Recent Lipid Panel    Component Value Date/Time   CHOL 171 03/31/2020 0837   TRIG 147 03/31/2020 0837   HDL 33 (L) 03/31/2020 0837   CHOLHDL 5.2 (H) 03/31/2020 0837   VLDL 54.6 (H) 10/24/2019 0733   LDLCALC 112 (H) 03/31/2020 0837   LDLDIRECT 94.0 10/24/2019 0733    Physical Exam:    VS:  BP 110/78 (BP Location: Left Arm, Patient Position: Sitting, Cuff Size: Normal)   Pulse (!) 48   Ht 5\' 11"  (1.803 m)   Wt 201 lb (91.2 kg)   SpO2 98%   BMI 28.03 kg/m     Wt Readings from Last 3 Encounters:  05/18/20 201 lb (91.2 kg)  03/31/20 200 lb 2 oz (90.8 kg)  11/11/19 211 lb (95.7 kg)     GEN:  Well nourished, well developed in no acute distress HEENT: Normal NECK: No JVD; No carotid bruits LYMPHATICS: No lymphadenopathy CARDIAC: RRR, no murmurs, no rubs, no gallops RESPIRATORY:  Clear to auscultation without rales, wheezing or rhonchi  ABDOMEN: Soft, non-tender, non-distended MUSCULOSKELETAL:  No edema; No  deformity  SKIN: Warm and dry LOWER EXTREMITIES: no swelling NEUROLOGIC:  Alert and oriented x 3 PSYCHIATRIC:  Normal affect   ASSESSMENT:    1. Essential hypertension, benign   2. Paroxysmal atrial fibrillation (HCC)   3. Gastroesophageal reflux disease without esophagitis   4. Hypothyroidism, unspecified type    PLAN:    In order of problems listed above:  1. Essential hypertension blood pressure well controlled continue present management. 2. Paroxysmal atrial fibrillation his chads 2 vascular equals 2 he is anticoagulant with Eliquis which I will continue.  EKG today shows sinus bradycardia, will discontinue his 25 mg of metoprolol succinate. 3.  Dyslipidemia I did review his K PN from 03/31/2020 his LDL is 112 and HDL 33.  I offer him addition of Zetia 10 mg daily to her medical regimen.  We will recheck his fasting lipid profile later. 4. Hypothyroidism that being followed by internal medicine team I did review K PN 2.66 TSH from 03/31/2020. 5. Overall he is doing well he is very active we did talk about healthy diet with his already on.   Medication Adjustments/Labs and Tests Ordered: Current medicines are reviewed at length with the patient today.  Concerns regarding medicines are outlined above.  No orders of the defined types were placed in this encounter.  Medication changes: No orders of the defined types were placed in this encounter.   Signed, Park Liter, MD, [facility] 05/18/2020 8:33 AM    [place]

## 2020-05-18 NOTE — Addendum Note (Signed)
Addended by: Senaida Ores on: 05/18/2020 08:40 AM   Modules accepted: Orders

## 2020-05-18 NOTE — Patient Instructions (Signed)
Medication Instructions:  Your physician has recommended you make the following change in your medication:   STOP: Metoprolol  START: Zetia 10 mg daily   *If you need a refill on your cardiac medications before your next appointment, please call your pharmacy*   Lab Work: Your physician recommends that you return for lab work in 6 weeks fasting: lipid, ast, alt  If you have labs (blood work) drawn today and your tests are completely normal, you will receive your results only by: Marland Kitchen MyChart Message (if you have MyChart) OR . A paper copy in the mail If you have any lab test that is abnormal or we need to change your treatment, we will call you to review the results.   Testing/Procedures: None.   Follow-Up: At [facility], you and your health needs are our priority.  As part of our continuing mission to provide you with exceptional heart care, we have created designated Provider Care Teams.  These Care Teams include your primary Cardiologist (physician) and Advanced Practice Providers (APPs -  Physician Assistants and Nurse Practitioners) who all work together to provide you with the care you need, when you need it.  We recommend signing up for the patient portal called "MyChart".  Sign up information is provided on this After Visit Summary.  MyChart is used to connect with patients for Virtual Visits (Telemedicine).  Patients are able to view lab/test results, encounter notes, upcoming appointments, etc.  Non-urgent messages can be sent to your provider as well.   To learn more about what you can do with MyChart, go to [email or web address].    Your next appointment:   1 year(s)  The format for your next appointment:   In Person  Provider:   Jenne Campus, MD   Other Instructions  Ezetimibe Tablets What is this medicine? EZETIMIBE (ez ET i mibe) blocks the absorption of cholesterol from the stomach. It can help lower blood cholesterol for patients who are at risk of  getting heart disease or a stroke. It is only for patients whose cholesterol level is not controlled by diet. This medicine may be used for other purposes; ask your health care provider or pharmacist if you have questions. COMMON BRAND NAME(S): Zetia What should I tell my health care provider before I take this medicine? They need to know if you have any of these conditions:  liver disease  an unusual or allergic reaction to ezetimibe, medicines, foods, dyes, or preservatives  pregnant or trying to get pregnant  breast-feeding How should I use this medicine? Take this medicine by mouth with a glass of water. Follow the directions on the prescription label. This medicine can be taken with or without food. Take your doses at regular intervals. Do not take your medicine more often than directed. Talk to your pediatrician regarding the use of this medicine in children. Special care may be needed. Overdosage: If you think you have taken too much of this medicine contact a poison control center or emergency room at once. NOTE: This medicine is only for you. Do not share this medicine with others. What if I miss a dose? If you miss a dose, take it as soon as you can. If it is almost time for your next dose, take only that dose. Do not take double or extra doses. What may interact with this medicine? Do not take this medicine with any of the following medications:  fenofibrate  gemfibrozil This medicine may also interact with the following medications:  antacids  cyclosporine  herbal medicines like red yeast rice  other medicines to lower cholesterol or triglycerides This list may not describe all possible interactions. Give your health care provider a list of all the medicines, herbs, non-prescription drugs, or dietary supplements you use. Also tell them if you smoke, drink alcohol, or use illegal drugs. Some items may interact with your medicine. What should I watch for while using this  medicine? Visit your doctor or health care professional for regular checks on your progress. You will need to have your cholesterol levels checked. If you are also taking some other cholesterol medicines, you will also need to have tests to make sure your liver is working properly. Tell your doctor or health care professional if you get any unexplained muscle pain, tenderness, or weakness, especially if you also have a fever and tiredness. You need to follow a low-cholesterol, low-fat diet while you are taking this medicine. This will decrease your risk of getting heart and blood vessel disease. Exercising and avoiding alcohol and smoking can also help. Ask your doctor or dietician for advice. What side effects may I notice from receiving this medicine? Side effects that you should report to your doctor or health care professional as soon as possible:  allergic reactions like skin rash, itching or hives, swelling of the face, lips, or tongue  dark yellow or brown urine  unusually weak or tired  yellowing of the skin or eyes Side effects that usually do not require medical attention (report to your doctor or health care professional if they continue or are bothersome):  diarrhea  dizziness  headache  stomach upset or pain This list may not describe all possible side effects. Call your doctor for medical advice about side effects. You may report side effects to FDA at 1-800-FDA-1088. Where should I keep my medicine? Keep out of the reach of children. Store at room temperature between 15 and 30 degrees C (59 and 86 degrees F). Protect from moisture. Keep container tightly closed. Throw away any unused medicine after the expiration date. NOTE: This sheet is a summary. It may not cover all possible information. If you have questions about this medicine, talk to your doctor, pharmacist, or health care provider.  2020 Elsevier/Gold Standard (2011-12-19 15:39:09)

## 2020-05-29 MED FILL — LEVOTHYROXINE SODIUM 125 MC: 125 | 30 days supply | Qty: 30 | Fill #5

## 2020-06-08 MED FILL — ELIQUIS 5 MG TABLET: 5 | 30 days supply | Qty: 60 | Fill #3

## 2020-06-08 MED FILL — HYDROCHLOROTHIAZIDE 25 MG T: 25 | 30 days supply | Qty: 30 | Fill #1

## 2020-06-15 ENCOUNTER — Other Ambulatory Visit: Payer: Self-pay | Admitting: Family Medicine

## 2020-06-15 MED ORDER — LEVOTHYROXINE SODIUM 125 MCG PO TABS: 125.0000 ug | ORAL_TABLET | Freq: Every day | ORAL | 1 refills | Status: DC

## 2020-06-15 MED ORDER — HYDROCHLOROTHIAZIDE 25 MG PO TABS: 25.0000 mg | ORAL_TABLET | Freq: Every day | ORAL | 3 refills | Status: DC

## 2020-06-16 ENCOUNTER — Other Ambulatory Visit: Payer: Self-pay

## 2020-06-16 MED ORDER — EZETIMIBE 10 MG PO TABS: 10.0000 mg | ORAL_TABLET | Freq: Every day | ORAL | 2 refills | Status: DC

## 2020-06-16 NOTE — Telephone Encounter (Signed)
Refill sent to [facility] for Zetia 10 mg.

## 2020-06-23 MED FILL — SILDENAFIL CITRATE 100 MG T: 100 | 23 days supply | Qty: 6 | Fill #1

## 2020-06-30 MED FILL — LEVOTHYROXINE SODIUM 125 MC: 125 | 30 days supply | Qty: 30 | Fill #0

## 2020-07-10 ENCOUNTER — Other Ambulatory Visit: Payer: Self-pay

## 2020-07-10 ENCOUNTER — Encounter: Payer: Self-pay | Admitting: Family Medicine

## 2020-07-10 ENCOUNTER — Ambulatory Visit (INDEPENDENT_AMBULATORY_CARE_PROVIDER_SITE_OTHER): Payer: Medicare Other | Admitting: Family Medicine

## 2020-07-10 VITALS — BP 122/70 | HR 61 | Temp 98.5°F | Ht 71.0 in | Wt 197.0 lb

## 2020-07-10 DIAGNOSIS — R195 Other fecal abnormalities: Secondary | ICD-10-CM

## 2020-07-10 LAB — BASIC METABOLIC PANEL
BUN: 18 mg/dL (ref 6–23)
CO2: 31 mEq/L (ref 19–32)
Calcium: 9.9 mg/dL (ref 8.4–10.5)
Chloride: 103 mEq/L (ref 96–112)
Creatinine, Ser: 1.06 mg/dL (ref 0.40–1.50)
GFR: 70.6 mL/min (ref >60.00)
Glucose, Bld: 106 mg/dL — ABNORMAL HIGH (ref 70–99)
Potassium: 4.3 mEq/L (ref 3.5–5.1)
Sodium: 140 mEq/L (ref 135–145)

## 2020-07-10 LAB — CBC
HCT: 40 % (ref 39.0–52.0)
Hemoglobin: 14 g/dL (ref 13.0–17.0)
MCHC: 34.9 g/dL (ref 30.0–36.0)
MCV: 93 fl (ref 78.0–100.0)
Platelets: 137 10*3/uL — ABNORMAL LOW (ref 150.0–400.0)
RBC: 4.3 Mil/uL (ref 4.22–5.81)
RDW: 13.3 % (ref 11.5–15.5)
WBC: 5.4 10*3/uL (ref 4.0–10.5)

## 2020-07-10 NOTE — Progress Notes (Signed)
Chief Complaint  Patient presents with  . GI Problem  . Weight Loss  . Diarrhea    Subjective: Patient is a 70 y.o. male here for GI issues.  Over the last 6 mo, has had loose stools. No stress, abd pain, fevers, bleeding, N/V, dietary changes. Earlier was tarry and black, most recently 1-2 mo ago. Spinach/greens appear to cause gas that he believes to be the underlying issue with his bowel movements. No other assoc s/s's. Lost 15 lbs over past year. He uses Omeprazole chronically, tried Pepto-Bismol. No sick contacts, no recent travel, no recent abx use.   Past Medical History:  Diagnosis Date  . Anxiety and depression 03/25/2014  . BMI 30.0-30.9,adult 05/15/2015  . Carpal tunnel syndrome of right wrist 10/23/2015  . Diverticulosis of colon without hemorrhage 03/17/2016  . Essential hypertension, benign 03/25/2014  . GERD (gastroesophageal reflux disease)   . Hyperlipidemia   . Hypothyroidism   . Low testosterone 05/15/2015  . Paroxysmal atrial fibrillation ([place]) 12/03/2018  . Prostate cancer screening encounter, options and risks discussed 04/30/2014  . Screening for ischemic heart disease 04/30/2014  . Swelling of both lower extremities 11/29/2017  . Visit for preventive health examination 04/30/2014   Objective: BP 122/70 (BP Location: Left Arm, Patient Position: Sitting, Cuff Size: Normal)   Pulse 61   Temp 98.5 F (36.9 C) (Oral)   Ht 5\' 11"  (1.803 m)   Wt 197 lb (89.4 kg)   SpO2 99%   BMI 27.48 kg/m  General: Awake, appears stated age Abd: BS+, S, NT, ND Heart: RRR Lungs: CTAB, no rales, wheezes or rhonchi. No accessory muscle use Psych: Age appropriate judgment and insight, normal affect and mood  Assessment and Plan: Loose stools - Plan: Tissue transglutaminase, IgA, Reticulin Antibody, IgA w reflex titer, Basic metabolic panel, CBC, Clostridium Difficile by PCR([facility]), Ova and parasite examination, Stool Culture  Ck above. Metamucil. Food/symptom journal.  Curlene Dolphin.  If no improvement, will consider TCA and GI referral. He will let me know if anything changes.  F/u as originally scheduled.  The patient voiced understanding and agreement to the plan.  [place], DO 07/10/20  11:57 AM

## 2020-07-10 NOTE — Patient Instructions (Addendum)
Give [place] 2-3 business days to get the results of your labs back.   Keep the diet clean and stay active.  Consider Gas-X or Beano to help with gas.  Please start taking Metamucil daily.   Keep a food/symptom journal regarding your symptoms.  Consider the low FODMAP approach.  If we aren't turning the corner, send me a message and we will set you up with the GI team.  Let us know if you need anything.

## 2020-07-16 ENCOUNTER — Other Ambulatory Visit: Payer: Medicare Other

## 2020-07-16 ENCOUNTER — Other Ambulatory Visit: Payer: Self-pay

## 2020-07-16 DIAGNOSIS — R195 Other fecal abnormalities: Secondary | ICD-10-CM

## 2020-07-17 LAB — CLOSTRIDIUM DIFFICILE BY PCR: Toxigenic C. Difficile by PCR: NEGATIVE

## 2020-07-20 LAB — STOOL CULTURE: E coli, Shiga toxin Assay: NEGATIVE

## 2020-07-21 LAB — OVA AND PARASITE EXAMINATION
CONCENTRATE RESULT:: NONE SEEN
MICRO NUMBER:: 11438415
SPECIMEN QUALITY:: ADEQUATE
TRICHROME RESULT:: NONE SEEN

## 2020-07-23 LAB — TISSUE TRANSGLUTAMINASE, IGA: (tTG) Ab, IgA: 85 U/mL — ABNORMAL HIGH

## 2020-07-23 LAB — RETICULIN ANTIBODIES, IGA W TITER: Reticulin IgA Screen: NEGATIVE

## 2020-08-31 MED FILL — ELIQUIS 5 MG TABLET: 5 | 30 days supply | Qty: 60 | Fill #4

## 2020-09-10 NOTE — Telephone Encounter (Signed)
I spoke with pt after talking with Raquel Sarna, a Advice worker.  We discovered the issue with the patient's billing concern surrounded trying to find out what coverage the pt had at the time of service.  The patient told me he had just retired at the end of 2021 and was stating his [employer] supplement should have paid for labs, etc.  I explained to the patient his Medicare would have had to pay first before any supplement could pick up the remaining bills.  [employer] had claims rejected by both Medicare and [employer].  I gave pt the Medicare phone number, as documented in the account notes, where he had previous called in and spoke to someone in the billing dept. And advised he call them to figure out what date they have his coverage effective.  I also provided the [facility] [place] billing number to pt so he can call them to provide insurance information to him once he gets this all straightened out.

## 2020-09-10 NOTE — Telephone Encounter (Signed)
Error

## 2020-10-02 DIAGNOSIS — F419 Anxiety disorder, unspecified: Secondary | ICD-10-CM

## 2020-10-02 DIAGNOSIS — F32A Depression, unspecified: Secondary | ICD-10-CM

## 2020-10-02 MED ORDER — OMEPRAZOLE 40 MG PO CPDR: 1.0000 | DELAYED_RELEASE_CAPSULE | Freq: Every day | ORAL | 3 refills | Status: DC

## 2020-10-05 MED ORDER — HYDROCHLOROTHIAZIDE 25 MG PO TABS: 25.0000 mg | ORAL_TABLET | Freq: Every day | ORAL | 3 refills | Status: DC

## 2020-10-05 MED ORDER — ATORVASTATIN CALCIUM 80 MG PO TABS: 1.0000 | ORAL_TABLET | Freq: Every day | ORAL | 3 refills | Status: DC

## 2020-10-05 MED ORDER — LOSARTAN POTASSIUM 100 MG PO TABS
1.0000 | ORAL_TABLET | Freq: Every day | ORAL | 3 refills | Status: DC
Start: 2020-10-05 — End: 2021-09-23

## 2020-10-05 MED ORDER — EZETIMIBE 10 MG PO TABS: 10.0000 mg | ORAL_TABLET | Freq: Every day | ORAL | 3 refills | Status: DC

## 2020-10-05 MED ORDER — LEVOTHYROXINE SODIUM 125 MCG PO TABS: ORAL_TABLET | Freq: Every day | ORAL | 1 refills | Status: DC

## 2020-10-05 MED ORDER — PAROXETINE HCL 30 MG PO TABS: 30.0000 mg | ORAL_TABLET | Freq: Every day | ORAL | 3 refills | Status: DC

## 2020-10-05 NOTE — Addendum Note (Signed)
Addended by: Sharon Seller B on: 10/05/2020 12:49 PM   Modules accepted: Orders

## 2020-11-01 MED FILL — Apixaban Tab 5 MG: ORAL | 30 days supply | Qty: 60 | Fill #0 | Status: AC

## 2020-11-02 ENCOUNTER — Other Ambulatory Visit (HOSPITAL_BASED_OUTPATIENT_CLINIC_OR_DEPARTMENT_OTHER): Payer: Self-pay

## 2020-12-31 ENCOUNTER — Other Ambulatory Visit: Payer: Self-pay | Admitting: Cardiology

## 2020-12-31 ENCOUNTER — Other Ambulatory Visit (HOSPITAL_BASED_OUTPATIENT_CLINIC_OR_DEPARTMENT_OTHER): Payer: Self-pay

## 2020-12-31 MED ORDER — APIXABAN 5 MG PO TABS
ORAL_TABLET | Freq: Two times a day (BID) | ORAL | 1 refills | Status: DC
  Filled 2020-12-31: qty 180, 90d supply, fill #0
  Filled 2021-07-01: qty 180, 90d supply, fill #1

## 2021-01-01 ENCOUNTER — Other Ambulatory Visit (HOSPITAL_BASED_OUTPATIENT_CLINIC_OR_DEPARTMENT_OTHER): Payer: Self-pay

## 2021-01-28 DIAGNOSIS — H2511 Age-related nuclear cataract, right eye: Secondary | ICD-10-CM | POA: Diagnosis not present

## 2021-01-28 DIAGNOSIS — H25811 Combined forms of age-related cataract, right eye: Secondary | ICD-10-CM | POA: Diagnosis not present

## 2021-02-25 ENCOUNTER — Telehealth: Payer: Self-pay | Admitting: Family Medicine

## 2021-02-25 NOTE — Telephone Encounter (Signed)
Screening quantiflow that shows significant TAD and he should have a vascular consult.  Darius Mitchell: House Calls: [phone]

## 2021-02-26 NOTE — Telephone Encounter (Signed)
This test was done by house calls and the results are being sent to our office Patient has a written results with him Scheduled to see PCP 03/02/21 at 3:30 PM

## 2021-02-26 NOTE — Telephone Encounter (Signed)
What is this from? Who ordered it? I don't see any results? Might need appt to find this out and how he is doing. Ty.

## 2021-03-02 ENCOUNTER — Ambulatory Visit (INDEPENDENT_AMBULATORY_CARE_PROVIDER_SITE_OTHER): Payer: Medicare Other | Admitting: Family Medicine

## 2021-03-02 ENCOUNTER — Other Ambulatory Visit: Payer: Self-pay

## 2021-03-02 ENCOUNTER — Encounter: Payer: Self-pay | Admitting: Family Medicine

## 2021-03-02 VITALS — BP 120/72 | HR 59 | Temp 98.0°F | Ht 71.0 in | Wt 192.5 lb

## 2021-03-02 DIAGNOSIS — R6889 Other general symptoms and signs: Secondary | ICD-10-CM | POA: Diagnosis not present

## 2021-03-02 NOTE — Patient Instructions (Signed)
Someone will reach out regarding your ankle-brachial index testing. Our next steps will be based on that.   Don't lose sleep in this in the meanwhile.   Let us know if you need anything.

## 2021-03-02 NOTE — Progress Notes (Signed)
Chief Complaint  Patient presents with   Results    Subjective: Patient is a 70 y.o. male here for f/u.  Patient had a Quanta flow test done to screen for PAD that showed severe disease. 0.29 and 0.2 respectively of the left and right foot.  He denies any skin changes.  He does not have any pain of the lower extremity he is compliant with the Lipitor 80 mg daily.  Past Medical History:  Diagnosis Date   Anxiety and depression 03/25/2014   BMI 30.0-30.9,adult 05/15/2015   Carpal tunnel syndrome of right wrist 10/23/2015   Diverticulosis of colon without hemorrhage 03/17/2016   Essential hypertension, benign 03/25/2014   GERD (gastroesophageal reflux disease)    Hyperlipidemia    Hypothyroidism    Low testosterone 05/15/2015   Paroxysmal atrial fibrillation ([place]) 12/03/2018   Prostate cancer screening encounter, options and risks discussed 04/30/2014   Screening for ischemic heart disease 04/30/2014   Swelling of both lower extremities 11/29/2017   Visit for preventive health examination 04/30/2014    Objective: BP 120/72   Pulse (!) 59   Temp 98 F (36.7 C) (Oral)   Ht '5\' 11"'$  (1.803 m)   Wt 192 lb 8 oz (87.3 kg)   SpO2 99%   BMI 26.85 kg/m  General: Awake, appears stated age Skin: Hair is present on both feet and lower extremities. Heart: RRR, DP pulse 3+ bilaterally, DP pulse 1+ bilaterally, brisk capillary refill of toes bilaterally Lungs: No accessory muscle use MSK: There is no tenderness to palpation of the musculature of the lower extremities bilaterally Psych: Age appropriate judgment and insight, normal affect and mood  Assessment and Plan: Abnormal finding on screening procedure - Plan: PCV ANKLE BRACHIAL INDEX (ABI)  New problem, uncertain prognosis.  Based off of his symptoms, would expect the ABI to be normal.  Given the abnormal screening test, will double check.  If the ABI is abnormal, we will refer him to the vascular surgery team for further evaluation.  Follow-up  as originally scheduled. The patient voiced understanding and agreement to the plan.  [place], DO 03/02/21  4:32 PM

## 2021-03-18 ENCOUNTER — Other Ambulatory Visit: Payer: Self-pay

## 2021-03-18 ENCOUNTER — Other Ambulatory Visit: Payer: Medicare Other

## 2021-03-18 ENCOUNTER — Ambulatory Visit: Payer: Medicare Other

## 2021-03-18 DIAGNOSIS — R6889 Other general symptoms and signs: Secondary | ICD-10-CM | POA: Diagnosis not present

## 2021-04-01 ENCOUNTER — Other Ambulatory Visit: Payer: Self-pay | Admitting: Family Medicine

## 2021-04-27 ENCOUNTER — Other Ambulatory Visit: Payer: Self-pay

## 2021-04-27 ENCOUNTER — Ambulatory Visit (INDEPENDENT_AMBULATORY_CARE_PROVIDER_SITE_OTHER): Payer: Medicare Other | Admitting: Family Medicine

## 2021-04-27 ENCOUNTER — Encounter: Payer: Self-pay | Admitting: Family Medicine

## 2021-04-27 VITALS — BP 108/68 | HR 56 | Temp 98.5°F | Ht 71.0 in | Wt 195.0 lb

## 2021-04-27 DIAGNOSIS — Z125 Encounter for screening for malignant neoplasm of prostate: Secondary | ICD-10-CM

## 2021-04-27 DIAGNOSIS — I480 Paroxysmal atrial fibrillation: Secondary | ICD-10-CM

## 2021-04-27 DIAGNOSIS — Z0000 Encounter for general adult medical examination without abnormal findings: Secondary | ICD-10-CM | POA: Diagnosis not present

## 2021-04-27 DIAGNOSIS — E782 Mixed hyperlipidemia: Secondary | ICD-10-CM

## 2021-04-27 LAB — CBC
HCT: 41.1 % (ref 39.0–52.0)
Hemoglobin: 14.1 g/dL (ref 13.0–17.0)
MCHC: 34.2 g/dL (ref 30.0–36.0)
MCV: 94.1 fl (ref 78.0–100.0)
Platelets: 164 10*3/uL (ref 150.0–400.0)
RBC: 4.37 Mil/uL (ref 4.22–5.81)
RDW: 13.1 % (ref 11.5–15.5)
WBC: 5.2 10*3/uL (ref 4.0–10.5)

## 2021-04-27 LAB — LIPID PANEL
Cholesterol: 136 mg/dL (ref 0–200)
HDL: 50.5 mg/dL (ref >39.00)
LDL Cholesterol: 66 mg/dL (ref 0–99)
NonHDL: 85.84
Total CHOL/HDL Ratio: 3
Triglycerides: 99 mg/dL (ref 0.0–149.0)
VLDL: 19.8 mg/dL (ref 0.0–40.0)

## 2021-04-27 LAB — COMPREHENSIVE METABOLIC PANEL
ALT: 19 U/L (ref 0–53)
AST: 21 U/L (ref 0–37)
Albumin: 4.5 g/dL (ref 3.5–5.2)
Alkaline Phosphatase: 53 U/L (ref 39–117)
BUN: 11 mg/dL (ref 6–23)
CO2: 33 mEq/L — ABNORMAL HIGH (ref 19–32)
Calcium: 9.8 mg/dL (ref 8.4–10.5)
Chloride: 97 mEq/L (ref 96–112)
Creatinine, Ser: 1.02 mg/dL (ref 0.40–1.50)
GFR: 73.52 mL/min (ref >60.00)
Glucose, Bld: 79 mg/dL (ref 70–99)
Potassium: 4.3 mEq/L (ref 3.5–5.1)
Sodium: 136 mEq/L (ref 135–145)
Total Bilirubin: 0.9 mg/dL (ref 0.2–1.2)
Total Protein: 6.9 g/dL (ref 6.0–8.3)

## 2021-04-27 LAB — PSA: PSA: 0.26 ng/mL (ref 0.10–4.00)

## 2021-04-27 NOTE — Progress Notes (Signed)
Chief Complaint  Patient presents with   Annual Exam   Neck Pain    Well Male Darius Mitchell is here for a complete physical.   His last physical was >1 year ago.  Current diet: in general, a "healthy" diet.   Current exercise: golfing, yard work Weight trend: stable Fatigue out of ordinary? No. Seat belt? Yes.    Health maintenance Shingrix- Due for 2nd Shingrix.  Colonoscopy- Yes Tetanus- N/A 2/2 insurance Hep C- Yes Pneumonia vaccine- Yes  Past Medical History:  Diagnosis Date   Anxiety and depression 03/25/2014   BMI 30.0-30.9,adult 05/15/2015   Carpal tunnel syndrome of right wrist 10/23/2015   Diverticulosis of colon without hemorrhage 03/17/2016   Essential hypertension, benign 03/25/2014   GERD (gastroesophageal reflux disease)    Hyperlipidemia    Hypothyroidism    Low testosterone 05/15/2015   Paroxysmal atrial fibrillation ([place]) 12/03/2018   Prostate cancer screening encounter, options and risks discussed 04/30/2014   Screening for ischemic heart disease 04/30/2014   Swelling of both lower extremities 11/29/2017   Visit for preventive health examination 04/30/2014     Past Surgical History:  Procedure Laterality Date   [place]    Medications  Current Outpatient Medications on File Prior to Visit  Medication Sig Dispense Refill   apixaban (ELIQUIS) 5 MG TABS tablet Take 1 tablet by mouth 2 (two) times daily. 180 tablet 1   atorvastatin (LIPITOR) 80 MG tablet Take 1 tablet (80 mg total) by mouth daily. 90 tablet 3   ezetimibe (ZETIA) 10 MG tablet Take 1 tablet (10 mg total) by mouth daily. 90 tablet 3   hydrochlorothiazide (HYDRODIURIL) 25 MG tablet Take 1 tablet (25 mg total) by mouth daily. 90 tablet 3   levothyroxine (SYNTHROID) 125 MCG tablet TAKE 1 TABLET BY MOUTH ONCE DAILY BEFORE BREAKFAST 90 tablet 1   losartan (COZAAR) 100 MG tablet Take 1 tablet (100 mg total) by mouth daily. 90 tablet 3   Multiple  Vitamins-Minerals (CENTRUM SILVER ULTRA MENS) TABS Take by mouth daily.     omeprazole (PRILOSEC) 40 MG capsule Take 1 capsule (40 mg total) by mouth daily. 90 capsule 3   PARoxetine (PAXIL) 30 MG tablet Take 1 tablet (30 mg total) by mouth daily. 90 tablet 3   sildenafil (VIAGRA) 100 MG tablet TAKE 1/2 TO 1 TABLET BY MOUTH DAILY AS NEEDED FOR ERECTILE DYSFUNCTION 10 tablet 5   [DISCONTINUED] metoprolol succinate (TOPROL-XL) 25 MG 24 hr tablet TAKE 1 TABLET BY MOUTH ONCE DAILY 90 tablet 2   Allergies Allergies  Allergen Reactions   Codeine Nausea Only   Niacin And Related     Family History Family History  Problem Relation Age of Onset   Stroke Mother 76       Deceased   Heart attack Mother    Lymphoma Father 66       Deceased   Heart disease Father    Stroke Maternal Grandmother    Stomach cancer Paternal Grandfather    Healthy Brother        x2   Healthy Sister    Hypertension Daughter    Healthy Daughter     Review of Systems: Constitutional:  no fevers Eye:  no recent significant change in vision Ears:  No changes in hearing Nose/Mouth/Throat:  no complaints of nasal congestion, no sore throat Cardiovascular: no chest pain Respiratory:  No shortness of breath Gastrointestinal:  No change in bowel  habits GU:  No frequency Integumentary:  no abnormal skin lesions reported Neurologic:  no headaches Endocrine:  denies unexplained weight changes  Exam BP 108/68   Pulse (!) 56   Temp 98.5 F (36.9 C) (Oral)   Ht 5\' 11"  (1.803 m)   Wt 195 lb (88.5 kg)   SpO2 99%   BMI 27.20 kg/m  General:  well developed, well nourished, in no apparent distress Skin:  no significant moles, warts, or growths Head:  no masses, lesions, or tenderness Eyes:  pupils equal and round, sclera anicteric without injection Ears:  canals without lesions, TMs shiny without retraction, no obvious effusion, no erythema Nose:  nares patent, septum midline, mucosa normal Throat/Pharynx:  lips  and gingiva without lesion; tongue and uvula midline; non-inflamed pharynx; no exudates or postnasal drainage Lungs:  clear to auscultation, breath sounds equal bilaterally, no respiratory distress Cardio:  bradycardic, regular rhythm, no LE edema or bruits Rectal: Deferred GI: BS+, S, NT, ND, no masses or organomegaly Musculoskeletal:  symmetrical muscle groups noted without atrophy or deformity Neuro:  gait normal; deep tendon reflexes normal and symmetric Psych: well oriented with normal range of affect and appropriate judgment/insight  Assessment and Plan  Well adult exam  Mixed hyperlipidemia - Plan: Comprehensive metabolic panel, Lipid panel  Paroxysmal atrial fibrillation ([place]), Chronic - Plan: CBC  Screening for prostate cancer - Plan: PSA   Well 70 y.o. male. Counseled on diet and exercise. Other orders as above. Needs 2nd Shingrix. UTD w HM otherwise. Discussed risks/benefits of PSA screening. Agreed to undergo testing.  Follow up in 6 mo or prn.  The patient voiced understanding and agreement to the plan.  [place], DO 04/27/21 9:24 AM

## 2021-04-27 NOTE — Patient Instructions (Addendum)
Give [place] 2-3 business days to get the results of your labs back.   Keep the diet clean and stay active.  Please call [facility] for your 2nd Shingrix vaccination.   Ice/cold pack over area for 10-15 min twice daily.  Heat (pad or rice pillow in microwave) over affected area, 10-15 minutes twice daily.   I am not concerned about the area on your neck. It is the submandibular gland which is a normal structure in that area.   Let us know if you need anything.  EXERCISES RANGE OF MOTION (ROM) AND STRETCHING EXERCISES  These exercises may help you when beginning to rehabilitate your issue. In order to successfully resolve your symptoms, you must improve your posture. These exercises are designed to help reduce the forward-head and rounded-shoulder posture which contributes to this condition. Your symptoms may resolve with or without further involvement from your physician, physical therapist or athletic trainer. While completing these exercises, remember:  Restoring tissue flexibility helps normal motion to return to the joints. This allows healthier, less painful movement and activity. An effective stretch should be held for at least 20 seconds, although you may need to begin with shorter hold times for comfort. A stretch should never be painful. You should only feel a gentle lengthening or release in the stretched tissue. Do not do any stretch or exercise that you cannot tolerate.  STRETCH- Axial Extensors Lie on your back on the floor. You may bend your knees for comfort. Place a rolled-up hand towel or dish towel, about 2 inches in diameter, under the part of your head that makes contact with the floor. Gently tuck your chin, as if trying to make a "double chin," until you feel a gentle stretch at the base of your head. Hold 15-20 seconds. Repeat 2-3 times. Complete this exercise 1 time per day.   STRETCH - Axial Extension  Stand or sit on a firm surface. Assume a good posture: chest up,  shoulders drawn back, abdominal muscles slightly tense, knees unlocked (if standing) and feet hip width apart. Slowly retract your chin so your head slides back and your chin slightly lowers. Continue to look straight ahead. You should feel a gentle stretch in the back of your head. Be certain not to feel an aggressive stretch since this can cause headaches later. Hold for 15-20 seconds. Repeat 2-3 times. Complete this exercise 1 time per day.  STRETCH - Cervical Side Bend  Stand or sit on a firm surface. Assume a good posture: chest up, shoulders drawn back, abdominal muscles slightly tense, knees unlocked (if standing) and feet hip width apart. Without letting your nose or shoulders move, slowly tip your right / left ear to your shoulder until your feel a gentle stretch in the muscles on the opposite side of your neck. Hold 15-20 seconds. Repeat 2-3 times. Complete this exercise 1-2 times per day.  STRETCH - Cervical Rotators  Stand or sit on a firm surface. Assume a good posture: chest up, shoulders drawn back, abdominal muscles slightly tense, knees unlocked (if standing) and feet hip width apart. Keeping your eyes level with the ground, slowly turn your head until you feel a gentle stretch along the back and opposite side of your neck. Hold 15-20 seconds. Repeat 2-3 times. Complete this exercise 1-2 times per day.  RANGE OF MOTION - Neck Circles  Stand or sit on a firm surface. Assume a good posture: chest up, shoulders drawn back, abdominal muscles slightly tense, knees unlocked (if standing)  and feet hip width apart. Gently roll your head down and around from the back of one shoulder to the back of the other. The motion should never be forced or painful. Repeat the motion 10-20 times, or until you feel the neck muscles relax and loosen. Repeat 2-3 times. Complete the exercise 1-2 times per day. STRENGTHENING EXERCISES - Cervical Strain and Sprain These exercises may help you when  beginning to rehabilitate your injury. They may resolve your symptoms with or without further involvement from your physician, physical therapist, or athletic trainer. While completing these exercises, remember:  Muscles can gain both the endurance and the strength needed for everyday activities through controlled exercises. Complete these exercises as instructed by your physician, physical therapist, or athletic trainer. Progress the resistance and repetitions only as guided. You may experience muscle soreness or fatigue, but the pain or discomfort you are trying to eliminate should never worsen during these exercises. If this pain does worsen, stop and make certain you are following the directions exactly. If the pain is still present after adjustments, discontinue the exercise until you can discuss the trouble with your clinician.  STRENGTH - Cervical Flexors, Isometric Face a wall, standing about 6 inches away. Place a small pillow, a ball about 6-8 inches in diameter, or a folded towel between your forehead and the wall. Slightly tuck your chin and gently push your forehead into the soft object. Push only with mild to moderate intensity, building up tension gradually. Keep your jaw and forehead relaxed. Hold 10 to 20 seconds. Keep your breathing relaxed. Release the tension slowly. Relax your neck muscles completely before you start the next repetition. Repeat 2-3 times. Complete this exercise 1 time per day.  STRENGTH- Cervical Lateral Flexors, Isometric  Stand about 6 inches away from a wall. Place a small pillow, a ball about 6-8 inches in diameter, or a folded towel between the side of your head and the wall. Slightly tuck your chin and gently tilt your head into the soft object. Push only with mild to moderate intensity, building up tension gradually. Keep your jaw and forehead relaxed. Hold 10 to 20 seconds. Keep your breathing relaxed. Release the tension slowly. Relax your neck muscles  completely before you start the next repetition. Repeat 2-3 times. Complete this exercise 1 time per day.  STRENGTH - Cervical Extensors, Isometric  Stand about 6 inches away from a wall. Place a small pillow, a ball about 6-8 inches in diameter, or a folded towel between the back of your head and the wall. Slightly tuck your chin and gently tilt your head back into the soft object. Push only with mild to moderate intensity, building up tension gradually. Keep your jaw and forehead relaxed. Hold 10 to 20 seconds. Keep your breathing relaxed. Release the tension slowly. Relax your neck muscles completely before you start the next repetition. Repeat 2-3 times. Complete this exercise 1 time per day.  POSTURE AND BODY MECHANICS CONSIDERATIONS Keeping correct posture when sitting, standing or completing your activities will reduce the stress put on different body tissues, allowing injured tissues a chance to heal and limiting painful experiences. The following are general guidelines for improved posture. Your physician or physical therapist will provide you with any instructions specific to your needs. While reading these guidelines, remember: The exercises prescribed by your provider will help you have the flexibility and strength to maintain correct postures. The correct posture provides the optimal environment for your joints to work. All of your joints  have less wear and tear when properly supported by a spine with good posture. This means you will experience a healthier, less painful body. Correct posture must be practiced with all of your activities, especially prolonged sitting and standing. Correct posture is as important when doing repetitive low-stress activities (typing) as it is when doing a single heavy-load activity (lifting).  PROLONGED STANDING WHILE SLIGHTLY LEANING FORWARD When completing a task that requires you to lean forward while standing in one place for a long time, place either  foot up on a stationary 2- to 4-inch high object to help maintain the best posture. When both feet are on the ground, the low back tends to lose its slight inward curve. If this curve flattens (or becomes too large), then the back and your other joints will experience too much stress, fatigue more quickly, and can cause pain.   RESTING POSITIONS Consider which positions are most painful for you when choosing a resting position. If you have pain with flexion-based activities (sitting, bending, stooping, squatting), choose a position that allows you to rest in a less flexed posture. You would want to avoid curling into a fetal position on your side. If your pain worsens with extension-based activities (prolonged standing, working overhead), avoid resting in an extended position such as sleeping on your stomach. Most people will find more comfort when they rest with their spine in a more neutral position, neither too rounded nor too arched. Lying on a non-sagging bed on your side with a pillow between your knees, or on your back with a pillow under your knees will often provide some relief. Keep in mind, being in any one position for a prolonged period of time, no matter how correct your posture, can still lead to stiffness.  WALKING Walk with an upright posture. Your ears, shoulders, and hips should all line up. OFFICE WORK When working at a desk, create an environment that supports good, upright posture. Without extra support, muscles fatigue and lead to excessive strain on joints and other tissues.  CHAIR: A chair should be able to slide under your desk when your back makes contact with the back of the chair. This allows you to work closely. The chair's height should allow your eyes to be level with the upper part of your monitor and your hands to be slightly lower than your elbows. Body position: Your feet should make contact with the floor. If this is not possible, use a foot rest. Keep your ears  over your shoulders. This will reduce stress on your neck and low back.

## 2021-05-13 DIAGNOSIS — H25812 Combined forms of age-related cataract, left eye: Secondary | ICD-10-CM | POA: Diagnosis not present

## 2021-05-28 ENCOUNTER — Ambulatory Visit: Payer: Medicare Other | Admitting: Cardiology

## 2021-05-28 ENCOUNTER — Encounter: Payer: Self-pay | Admitting: Cardiology

## 2021-05-28 ENCOUNTER — Other Ambulatory Visit: Payer: Self-pay

## 2021-05-28 VITALS — BP 118/76 | HR 57 | Ht 71.0 in | Wt 197.0 lb

## 2021-05-28 DIAGNOSIS — I480 Paroxysmal atrial fibrillation: Secondary | ICD-10-CM | POA: Diagnosis not present

## 2021-05-28 DIAGNOSIS — E782 Mixed hyperlipidemia: Secondary | ICD-10-CM | POA: Diagnosis not present

## 2021-05-28 DIAGNOSIS — I10 Essential (primary) hypertension: Secondary | ICD-10-CM

## 2021-05-28 NOTE — Patient Instructions (Signed)

## 2021-05-28 NOTE — Progress Notes (Signed)
Cardiology Office Note:    Date:  05/28/2021   ID:  Darius Mitchell, DOB 1950-09-03, MRN [id number]  PCP:  [name], DO  Cardiologist:  Jenne Campus, MD    Referring MD: [name]*   Chief Complaint  Patient presents with   Follow-up  I am doing great  History of Present Illness:    Darius Mitchell is a 70 y.o. male with past medical history significant for paroxysmal atrial fibrillation, he is anticoagulated, essential hypertension, dyslipidemia. Is following up with me in the office today overall he is doing great.  He is retired for 1 year and he enjoyed his retirement tremendously.  He denies have any chest pain tightness squeezing pressure burning chest no dizziness no passing out.  No palpitations.  Past Medical History:  Diagnosis Date   Anxiety and depression 03/25/2014   BMI 30.0-30.9,adult 05/15/2015   Carpal tunnel syndrome of right wrist 10/23/2015   Diverticulosis of colon without hemorrhage 03/17/2016   Essential hypertension, benign 03/25/2014   GERD (gastroesophageal reflux disease)    Hyperlipidemia    Hypothyroidism    Low testosterone 05/15/2015   Paroxysmal atrial fibrillation ([place]) 12/03/2018   Prostate cancer screening encounter, options and risks discussed 04/30/2014   Screening for ischemic heart disease 04/30/2014   Swelling of both lower extremities 11/29/2017   Visit for preventive health examination 04/30/2014    Past Surgical History:  Procedure Laterality Date   APPENDECTOMY  1971   WISDOM TOOTH EXTRACTION  1972    Current Medications: Current Meds  Medication Sig   apixaban (ELIQUIS) 5 MG TABS tablet Take 1 tablet by mouth 2 (two) times daily. (Patient taking differently: Take 5 mg by mouth 2 (two) times daily.)   atorvastatin (LIPITOR) 80 MG tablet Take 1 tablet (80 mg total) by mouth daily.   ezetimibe (ZETIA) 10 MG tablet Take 1 tablet (10 mg total) by mouth daily.   hydrochlorothiazide (HYDRODIURIL) 25 MG tablet  Take 1 tablet (25 mg total) by mouth daily.   levothyroxine (SYNTHROID) 125 MCG tablet TAKE 1 TABLET BY MOUTH ONCE DAILY BEFORE BREAKFAST (Patient taking differently: Take 125 mcg by mouth daily before breakfast.)   losartan (COZAAR) 100 MG tablet Take 1 tablet (100 mg total) by mouth daily.   Multiple Vitamins-Minerals (CENTRUM SILVER ULTRA MENS) TABS Take 1 tablet by mouth daily. Unknown strength   omeprazole (PRILOSEC) 40 MG capsule Take 1 capsule (40 mg total) by mouth daily.   PARoxetine (PAXIL) 30 MG tablet Take 1 tablet (30 mg total) by mouth daily.   sildenafil (VIAGRA) 100 MG tablet TAKE 1/2 TO 1 TABLET BY MOUTH DAILY AS NEEDED FOR ERECTILE DYSFUNCTION (Patient taking differently: Take 50-100 mg by mouth as needed for erectile dysfunction.)     Allergies:   Codeine and Niacin and related   Social History   Socioeconomic History   Marital status: Married    Spouse name: Not on file   Number of children: Not on file   Years of education: Not on file   Highest education level: Not on file  Occupational History   Not on file  Tobacco Use   Smoking status: Former    Packs/day: 0.25    Years: 25.00    Pack years: 6.25    Types: Cigarettes    Quit date: 05/28/2019    Years since quitting: 2.0   Smokeless tobacco: Never  Vaping Use   Vaping Use: Never used  Substance and Sexual Activity  Alcohol use: Never   Drug use: Never   Sexual activity: Not on file  Other Topics Concern   Not on file  Social History Narrative   Not on file   Social Determinants of Health   Financial Resource Strain: Not on file  Food Insecurity: Not on file  Transportation Needs: Not on file  Physical Activity: Not on file  Stress: Not on file  Social Connections: Not on file     Family History: The patient's family history includes Healthy in his brother, daughter, and sister; Heart attack in his mother; Heart disease in his father; Hypertension in his daughter; Lymphoma (age of onset: 5)  in his father; Stomach cancer in his paternal grandfather; Stroke in his maternal grandmother; Stroke (age of onset: 63) in his mother. ROS:   Please see the history of present illness.    All 14 point review of systems negative except as described per history of present illness  EKGs/Labs/Other Studies Reviewed:      Recent Labs: 04/27/2021: ALT 19; BUN 11; Creatinine, Ser 1.02; Hemoglobin 14.1; Platelets 164.0; Potassium 4.3; Sodium 136  Recent Lipid Panel    Component Value Date/Time   CHOL 136 04/27/2021 0941   TRIG 99.0 04/27/2021 0941   HDL 50.50 04/27/2021 0941   CHOLHDL 3 04/27/2021 0941   VLDL 19.8 04/27/2021 0941   LDLCALC 66 04/27/2021 0941   LDLCALC 112 (H) 03/31/2020 0837   LDLDIRECT 94.0 10/24/2019 0733    Physical Exam:    VS:  BP 118/76 (BP Location: Left Arm, Patient Position: Sitting)   Pulse (!) 57   Ht 5\' 11"  (1.803 m)   Wt 197 lb (89.4 kg)   SpO2 98%   BMI 27.48 kg/m     Wt Readings from Last 3 Encounters:  05/28/21 197 lb (89.4 kg)  04/27/21 195 lb (88.5 kg)  03/02/21 192 lb 8 oz (87.3 kg)     GEN:  Well nourished, well developed in no acute distress HEENT: Normal NECK: No JVD; No carotid bruits LYMPHATICS: No lymphadenopathy CARDIAC: RRR, no murmurs, no rubs, no gallops RESPIRATORY:  Clear to auscultation without rales, wheezing or rhonchi  ABDOMEN: Soft, non-tender, non-distended MUSCULOSKELETAL:  No edema; No deformity  SKIN: Warm and dry LOWER EXTREMITIES: no swelling NEUROLOGIC:  Alert and oriented x 3 PSYCHIATRIC:  Normal affect   ASSESSMENT:    1. Paroxysmal atrial fibrillation (HCC)   2. Essential hypertension, benign   3. Mixed hyperlipidemia    PLAN:    In order of problems listed above:  Paroxysmal atrial fibrillation maintaining sinus rhythm.  He is anticoagulated which I will continue.  No recurrences of arrhythmia Essential hypertension: Blood pressure seems to well controlled continue present management. Mixed  dyslipidemia I did review his K PN his LDL is 66 HDL 50 good cholesterol profile we will continue present management. Overall he is doing very well.  He is very active he lost some weight he is taking care of himself as well as taking care of his wife who does have chronic back problem. Enlargement of ascending aorta only 36 mm next year we will repeat echocardiogram.   Medication Adjustments/Labs and Tests Ordered: Current medicines are reviewed at length with the patient today.  Concerns regarding medicines are outlined above.  No orders of the defined types were placed in this encounter.  Medication changes: No orders of the defined types were placed in this encounter.   Signed, Park Liter, MD, [facility] 05/28/2021 11:24 AM  [facility]

## 2021-06-06 ENCOUNTER — Encounter: Payer: Self-pay | Admitting: Family Medicine

## 2021-06-08 ENCOUNTER — Other Ambulatory Visit: Payer: Self-pay | Admitting: Family Medicine

## 2021-06-08 DIAGNOSIS — M542 Cervicalgia: Secondary | ICD-10-CM

## 2021-06-14 ENCOUNTER — Ambulatory Visit: Payer: Medicare Other | Admitting: Family Medicine

## 2021-06-14 ENCOUNTER — Ambulatory Visit (HOSPITAL_BASED_OUTPATIENT_CLINIC_OR_DEPARTMENT_OTHER)
Admission: RE | Admit: 2021-06-14 | Discharge: 2021-06-14 | Disposition: A | Discharge status: Home / Self Care | Payer: Medicare Other | Source: Ambulatory Visit | Attending: Family Medicine | Admitting: Family Medicine

## 2021-06-14 ENCOUNTER — Encounter: Payer: Self-pay | Admitting: Family Medicine

## 2021-06-14 ENCOUNTER — Other Ambulatory Visit: Payer: Self-pay

## 2021-06-14 VITALS — BP 130/78 | Ht 71.0 in | Wt 187.0 lb

## 2021-06-14 DIAGNOSIS — M542 Cervicalgia: Secondary | ICD-10-CM | POA: Diagnosis not present

## 2021-06-14 DIAGNOSIS — S161XXA Strain of muscle, fascia and tendon at neck level, initial encounter: Secondary | ICD-10-CM | POA: Insufficient documentation

## 2021-06-14 DIAGNOSIS — M2578 Osteophyte, vertebrae: Secondary | ICD-10-CM | POA: Diagnosis not present

## 2021-06-14 NOTE — Assessment & Plan Note (Signed)
Pain is occurring more over the sternocleidomastoid and near the origin.  No redness appreciated.   -Counseled on home exercise therapy and supportive care. -Referral to physical therapy. -X-ray. -May need consider injection or further imaging.

## 2021-06-14 NOTE — Patient Instructions (Signed)
Nice to meet you Please continue heat  Please try physical therapy  I will call with the results.   Please send me a message in MyChart with any questions or updates.  Please see me back in 6 weeks.   --Dr. Raeford Razor

## 2021-06-14 NOTE — Progress Notes (Signed)
Darius Mitchell - 70 y.o. male MRN [id number]  Date of birth: 1950/09/05  SUBJECTIVE:  Including CC & ROS.  No chief complaint on file.   Darius Mitchell is a 70 y.o. male that is presenting with acute on chronic left-sided neck pain.  He is having pain over the sternocleidomastoid.  Has been ongoing for several months.  Has tried heat and massage.  Pain seems to be ongoing.  No injury or inciting event.   Review of Systems See HPI   HISTORY: Past Medical, Surgical, Social, and Family History Reviewed & Updated per EMR.   Pertinent Historical Findings include:  Past Medical History:  Diagnosis Date   Anxiety and depression 03/25/2014   BMI 30.0-30.9,adult 05/15/2015   Carpal tunnel syndrome of right wrist 10/23/2015   Diverticulosis of colon without hemorrhage 03/17/2016   Essential hypertension, benign 03/25/2014   GERD (gastroesophageal reflux disease)    Hyperlipidemia    Hypothyroidism    Low testosterone 05/15/2015   Paroxysmal atrial fibrillation ([place]) 12/03/2018   Prostate cancer screening encounter, options and risks discussed 04/30/2014   Screening for ischemic heart disease 04/30/2014   Swelling of both lower extremities 11/29/2017   Visit for preventive health examination 04/30/2014    Past Surgical History:  Procedure Laterality Date   APPENDECTOMY  1971   WISDOM TOOTH EXTRACTION  1972    Family History  Problem Relation Age of Onset   Stroke Mother 61       Deceased   Heart attack Mother    Lymphoma Father 43       Deceased   Heart disease Father    Stroke Maternal Grandmother    Stomach cancer Paternal Grandfather    Healthy Brother        x2   Healthy Sister    Hypertension Daughter    Healthy Daughter     Social History   Socioeconomic History   Marital status: Married    Spouse name: Not on file   Number of children: Not on file   Years of education: Not on file   Highest education level: Not on file  Occupational History   Not on file  Tobacco  Use   Smoking status: Former    Packs/day: 0.25    Years: 25.00    Pack years: 6.25    Types: Cigarettes    Quit date: 05/28/2019    Years since quitting: 2.0   Smokeless tobacco: Never  Vaping Use   Vaping Use: Never used  Substance and Sexual Activity   Alcohol use: Never   Drug use: Never   Sexual activity: Not on file  Other Topics Concern   Not on file  Social History Narrative   Not on file   Social Determinants of Health   Financial Resource Strain: Not on file  Food Insecurity: Not on file  Transportation Needs: Not on file  Physical Activity: Not on file  Stress: Not on file  Social Connections: Not on file  Intimate Partner Violence: Not on file     PHYSICAL EXAM:  VS: BP 130/78 (BP Location: Left Arm, Patient Position: Sitting)    Ht 5\' 11"  (1.803 m)    Wt 187 lb (84.8 kg)    BMI 26.08 kg/m  Physical Exam Gen: NAD, alert, cooperative with exam, well-appearing    ASSESSMENT & PLAN:   Cervical strain Pain is occurring more over the sternocleidomastoid and near the origin.  No redness appreciated.   -Counseled on home  exercise therapy and supportive care. -Referral to physical therapy. -X-ray. -May need consider injection or further imaging.

## 2021-06-15 ENCOUNTER — Telehealth: Payer: Self-pay | Admitting: Family Medicine

## 2021-06-15 ENCOUNTER — Encounter: Payer: Self-pay | Admitting: Family Medicine

## 2021-06-15 NOTE — Telephone Encounter (Signed)
Left VM for patient. If he calls back please have him speak with a nurse/CMA and inform that his xray shows degenerative changes of the cervical spine. This is a normal change and we'll continue with physical therapy.   If any questions then please take the best time and phone number to call and I will try to call him back.   Rosemarie Ax, MD [facility] 06/15/2021, 9:23 AM

## 2021-07-01 ENCOUNTER — Other Ambulatory Visit (HOSPITAL_BASED_OUTPATIENT_CLINIC_OR_DEPARTMENT_OTHER): Payer: Self-pay

## 2021-07-02 ENCOUNTER — Other Ambulatory Visit (HOSPITAL_BASED_OUTPATIENT_CLINIC_OR_DEPARTMENT_OTHER): Payer: Self-pay

## 2021-07-09 DIAGNOSIS — H35352 Cystoid macular degeneration, left eye: Secondary | ICD-10-CM | POA: Diagnosis not present

## 2021-07-14 ENCOUNTER — Ambulatory Visit: Payer: Medicare Other | Admitting: Physical Therapy

## 2021-07-20 ENCOUNTER — Ambulatory Visit: Payer: Medicare Other

## 2021-07-23 ENCOUNTER — Ambulatory Visit: Payer: Medicare Other | Attending: Family Medicine | Admitting: Physical Therapy

## 2021-07-23 ENCOUNTER — Other Ambulatory Visit: Payer: Self-pay

## 2021-07-23 ENCOUNTER — Encounter: Payer: Self-pay | Admitting: Physical Therapy

## 2021-07-23 DIAGNOSIS — M542 Cervicalgia: Secondary | ICD-10-CM | POA: Insufficient documentation

## 2021-07-23 DIAGNOSIS — M62838 Other muscle spasm: Secondary | ICD-10-CM | POA: Insufficient documentation

## 2021-07-23 DIAGNOSIS — S161XXA Strain of muscle, fascia and tendon at neck level, initial encounter: Secondary | ICD-10-CM | POA: Diagnosis not present

## 2021-07-23 DIAGNOSIS — R293 Abnormal posture: Secondary | ICD-10-CM | POA: Diagnosis not present

## 2021-07-23 NOTE — Therapy (Signed)
[facility] [address]  [place], [place], [address] Phone: [phone]   Fax:  [phone]  Physical Therapy Evaluation  Patient Details  Name: Darius Mitchell MRN: [id number] Date of Birth: [birth year] Referring Provider (PT): Clearance Coots, MD   Encounter Date: 07/23/2021   PT End of Session - 07/23/21 1020     Visit Number 1    Number of Visits 8    Date for PT Re-Evaluation 08/20/21    Authorization Type UHC Medicare    PT Start Time 1020    PT Stop Time 1105    PT Time Calculation (min) 45 min    Activity Tolerance Patient tolerated treatment well    Behavior During Therapy [facility] for tasks assessed/performed             Past Medical History:  Diagnosis Date   Anxiety and depression 03/25/2014   BMI 30.0-30.9,adult 05/15/2015   Carpal tunnel syndrome of right wrist 10/23/2015   Diverticulosis of colon without hemorrhage 03/17/2016   Essential hypertension, benign 03/25/2014   GERD (gastroesophageal reflux disease)    Hyperlipidemia    Hypothyroidism    Low testosterone 05/15/2015   Paroxysmal atrial fibrillation ([place]) 12/03/2018   Prostate cancer screening encounter, options and risks discussed 04/30/2014   Screening for ischemic heart disease 04/30/2014   Swelling of both lower extremities 11/29/2017   Visit for preventive health examination 04/30/2014    Past Surgical History:  Procedure Laterality Date   APPENDECTOMY  1971   [place]    There were no vitals filed for this visit.    Subjective Assessment - 07/23/21 1022     Subjective Pt reports L sided neck pain for a little over 1 year - more of a nuisance that never goes away. Does not recall a trigger/MOI. He has tried changing pillows as well as heat and massage with no lasting relief. He remains very active, and pain does not typically prevent him from doing anything, but is just an ongoing annoyance. More notable when he  turns his head to the right.    Patient Stated Goals "release whatever is causing the issue"    Currently in Pain? Yes    Pain Score 3     Pain Location Neck    Pain Orientation Left    Pain Descriptors / Indicators Other (Comment)   "annoying"   Pain Type Chronic pain    Pain Radiating Towards n/a    Pain Onset More than a month ago   >1 year   Pain Frequency Intermittent    Aggravating Factors  typically wakes up with pain then on and off during the day, turning his head to the R    Pain Relieving Factors nothing    Effect of Pain on Daily Activities does not usually interfere - just there                [facility] PT Assessment - 07/23/21 1020       Assessment   Medical Diagnosis Strain of neck muscle - L-sided neck pain    Referring Provider (PT) Clearance Coots, MD    Onset Date/Surgical Date --   >1 yr   Hand Dominance Right   but ambidextrous   Next MD Visit 08/17/21    Prior Therapy none      Precautions   Precautions None      Restrictions   Weight Bearing Restrictions No  Balance Screen   Has the patient fallen in the past 6 months No    Has the patient had a decrease in activity level because of a fear of falling?  No    Is the patient reluctant to leave their home because of a fear of falling?  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      [employer]   Overall Cognitive Status Within Functional Limits for tasks assessed      Observation/Other Assessments   Focus on Therapeutic Outcomes (FOTO)  Neck = 71; predicted D/C FS = 74      Posture/Postural Control   Posture/Postural Control Postural limitations    Postural Limitations Forward head;Rounded Shoulders    Posture Comments protracted L>R  scapula      ROM / Strength   AROM / PROM / Strength AROM;Strength      AROM   Overall AROM Comments B shoulder ROM WNL    AROM Assessment Site Cervical    Cervical Flexion 39    Cervical Extension 38    Cervical - Right Side Bend 20    Cervical - Left Side Bend 26    Cervical - Right Rotation 48    Cervical - Left Rotation 55      Strength   Strength Assessment Site Shoulder    Right/Left Shoulder Right;Left    Right Shoulder Flexion 5/5    Right Shoulder ABduction 5/5    Right Shoulder Internal Rotation 5/5    Right Shoulder External Rotation 5/5    Left Shoulder Flexion 4/5    Left Shoulder ABduction 4+/5    Left Shoulder Internal Rotation 5/5    Left Shoulder External Rotation 4+/5      Palpation   Palpation comment increased muscle tension and mild TTP over L UT, LS and SCM                        Objective measurements completed on examination: See above findings.       [facility] Adult PT Treatment/Exercise - 07/23/21 1020       Exercises   Exercises Neck      Neck Exercises: Theraband   Shoulder Extension 10 reps;Green    Shoulder Extension Limitations cues for scap retraction and depression, tucking elbows into ribs   standing   Rows 10 reps;Green    Rows Limitations cues for scap retraction and depression, tucking elbows into ribs   standing     Neck Exercises: Seated   Neck Retraction 10 reps;5 secs    Other Seated Exercise Scap retraction 10 x 5"      Neck Exercises: Stretches   Upper Trapezius Stretch Left;1 rep;30 seconds    Levator Stretch Left;1 rep;30 seconds    Other Neck Stretches 3-way doorway pec stretch x 30 sec each                     PT Education - 07/23/21 1101     Education Details PT eval findings, anticipated POC, info on possible DN and initial HEP - Access Code: [email or web address]    Person(s) Educated Patient    Methods  Explanation;Demonstration;Verbal cues;Tactile cues;Handout    Comprehension Verbalized  understanding;Verbal cues required;Tactile cues required;Returned demonstration;Need further instruction                 PT Long Term Goals - 07/23/21 1105       PT LONG TERM GOAL #1   Title Patient will be independent with ongoing/advanced HEP for self-management at home    Status New    Target Date 08/20/21      PT LONG TERM GOAL #2   Title Improve posture and alignment with patient to demonstrate improved upright posture with posterior shoulder girdle engaged    Status New    Target Date 08/20/21      PT LONG TERM GOAL #3   Title Patient to improve cervical AROM to [facility] without pain provocation    Status New    Target Date 08/20/21      PT LONG TERM GOAL #4   Title Patient will report no sleep disturbance due to L-sided pain    Status New    Target Date 08/20/21      PT LONG TERM GOAL #5   Title Patient will report no limitation with ability to turn his head while driving due to neck pain or stiffness    Status New    Target Date 08/20/21                    Plan - 07/23/21 1105     Clinical Impression Statement Darius Mitchell is a 71 y/o male who presents to OP PT for chronic L-sided neck pain from apparent neck muscle strain. He does not recall a known trigger or MOI but states pain has been present for >1 yr with no relief noted from any measures he has taken to address the pain including changing his pillow for sleeping or use of heat and massage. He typically wakes up with pain and it remains intermittent t/o the day, most commonly triggered with turning his head to the R, but does not usually prevent him from doing what he needs to do during the day. Deficits include L-sided neck pain w/o radiculopathy, forward head and rounded shoulder posture with slight increased kyphosis and mildly protracted scapula L>R, increased muscle tension/tightness in L>R UT, LS, SCM and pecs with TPs and mild TTP noted in L UT and LS, decreased cervical ROM, and mild L shoulder weakness.  Darius Mitchell will benefit from skilled PT to address above deficits to improve posture, decrease pain, normalize muscle tension and restore pain free functional cervical ROM to improve tolerance for normal daily and leisure activities.    Personal Factors and Comorbidities Age;Comorbidity 3+;Past/Current Experience;Time since onset of injury/illness/exacerbation    Comorbidities HTN, a-fib, hypothyroidism, HLD, anxiety and depression, diverticulosis, GERD    Examination-Activity Limitations Other;Sleep   turning head   Examination-Participation Restrictions Community Activity;Interpersonal Relationship;Yard Work;Other   golf   Stability/Clinical Decision Making Stable/Uncomplicated    Clinical Decision Making Low    Rehab Potential Excellent    PT Frequency 2x / week    PT Duration 4 weeks    PT Treatment/Interventions ADLs/Self Care Home Management;Cryotherapy;Electrical Stimulation;Iontophoresis 4mg /ml Dexamethasone;Moist Heat;Traction;Ultrasound;Functional mobility training;Therapeutic activities;Therapeutic exercise;Neuromuscular re-education;Patient/family education;Manual techniques;Passive range of motion;Dry needling;Taping;Spinal Manipulations    PT Next Visit Plan Review initial HEP & possibly add SCM stretch; cervical and postural stretching and strengthening; manual STM/MFR with possible DN to L posterolateral neck as indicated; modalities PRN    PT Home Exercise Plan Access Code: [email or web address] (1/27)  Consulted and Agree with Plan of Care Patient             Patient will benefit from skilled therapeutic intervention in order to improve the following deficits and impairments:  Pain, Postural dysfunction, Improper body mechanics, Impaired flexibility, Impaired perceived functional ability, Increased fascial restricitons, Increased muscle spasms, Decreased range of motion, Decreased strength, Decreased activity tolerance  Visit Diagnosis: Cervicalgia - Plan: PT plan of care  cert/re-cert  Other muscle spasm - Plan: PT plan of care cert/re-cert  Abnormal posture - Plan: PT plan of care cert/re-cert     Problem List Patient Active Problem List   Diagnosis Date Noted   Cervical strain 06/14/2021   Paroxysmal atrial fibrillation ([place]) 12/03/2018   Swelling of both lower extremities 11/29/2017   Diverticulosis of colon without hemorrhage 03/17/2016   Carpal tunnel syndrome of right wrist 10/23/2015   Low testosterone 05/15/2015   BMI 30.0-30.9,adult 05/15/2015   Screening for ischemic heart disease 04/30/2014   Visit for preventive health examination 04/30/2014   Prostate cancer screening encounter, options and risks discussed 04/30/2014   Essential hypertension, benign 03/25/2014   Hyperlipidemia 03/25/2014   GERD (gastroesophageal reflux disease) 03/25/2014   Anxiety and depression 03/25/2014   Hypothyroidism 03/25/2014    Percival Spanish, PT 07/23/2021, 1:59 PM  [facility] [address]  [place], [place], [address] Phone: [phone]   Fax:  [phone]  Name: Darius Mitchell MRN: [id number] Date of Birth: 26-Oct-1950

## 2021-07-23 NOTE — Patient Instructions (Signed)
° ° °  Access Code: [email or web address] URL: [email or web address] Date: 07/23/2021 Prepared by: Annie Paras  Exercises Standing Cervical Retraction - 3 x daily - 7 x weekly - 2 sets - 10 reps - 5 sec hold Seated Gentle Upper Trapezius Stretch - 2-3 x daily - 7 x weekly - 3 reps - 30 sec hold Gentle Levator Scapulae Stretch - 2-3 x daily - 7 x weekly - 3 reps - 30 sec hold Doorway Pec Stretch at 60 Elevation - 2-3 x daily - 7 x weekly - 3 reps - 30 sec hold Doorway Pec Stretch at 90 Degrees Abduction - 2-3 x daily - 7 x weekly - 3 reps - 30 sec hold Doorway Pec Stretch at 120 Degrees Abduction - 2-3 x daily - 7 x weekly - 3 reps - 30 sec hold Seated Scapular Retraction - 2 x daily - 7 x weekly - 2 sets - 10 reps - 3-5 sec hold Standing Bilateral Low Shoulder Row with Anchored Resistance - 1 x daily - 7 x weekly - 2 sets - 10 reps - 5 sec hold Scapular Retraction with Resistance Advanced - 1 x daily - 7 x weekly - 2 sets - 10 reps - 5 sec hold  Patient Education Trigger Point Dry Needling

## 2021-07-30 ENCOUNTER — Ambulatory Visit: Payer: Medicare Other

## 2021-07-30 ENCOUNTER — Ambulatory Visit: Payer: Medicare Other | Admitting: Family Medicine

## 2021-08-02 ENCOUNTER — Ambulatory Visit: Payer: Medicare Other | Attending: Family Medicine | Admitting: Physical Therapy

## 2021-08-02 ENCOUNTER — Encounter: Payer: Self-pay | Admitting: Physical Therapy

## 2021-08-02 ENCOUNTER — Other Ambulatory Visit: Payer: Self-pay

## 2021-08-02 DIAGNOSIS — R293 Abnormal posture: Secondary | ICD-10-CM | POA: Insufficient documentation

## 2021-08-02 DIAGNOSIS — M62838 Other muscle spasm: Secondary | ICD-10-CM | POA: Diagnosis not present

## 2021-08-02 DIAGNOSIS — M542 Cervicalgia: Secondary | ICD-10-CM | POA: Insufficient documentation

## 2021-08-02 NOTE — Therapy (Signed)
[facility] [address]  [place], [place], [address] Phone: [phone]   Fax:  [phone]  Physical Therapy Treatment  Patient Details  Name: Darius Mitchell MRN: [id number] Date of Birth: [birth year] Referring Provider (PT): Clearance Coots, MD   Encounter Date: 08/02/2021   PT End of Session - 08/02/21 1445     Visit Number 2    Number of Visits 8    Date for PT Re-Evaluation 08/20/21    Authorization Type UHC Medicare    PT Start Time 0938    PT Stop Time 1529    PT Time Calculation (min) 44 min    Activity Tolerance Patient tolerated treatment well    Behavior During Therapy [facility] for tasks assessed/performed             Past Medical History:  Diagnosis Date   Anxiety and depression 03/25/2014   BMI 30.0-30.9,adult 05/15/2015   Carpal tunnel syndrome of right wrist 10/23/2015   Diverticulosis of colon without hemorrhage 03/17/2016   Essential hypertension, benign 03/25/2014   GERD (gastroesophageal reflux disease)    Hyperlipidemia    Hypothyroidism    Low testosterone 05/15/2015   Paroxysmal atrial fibrillation ([place]) 12/03/2018   Prostate cancer screening encounter, options and risks discussed 04/30/2014   Screening for ischemic heart disease 04/30/2014   Swelling of both lower extremities 11/29/2017   Visit for preventive health examination 04/30/2014    Past Surgical History:  Procedure Laterality Date   APPENDECTOMY  1971   [place]    There were no vitals filed for this visit.   Subjective Assessment - 08/02/21 1449     Subjective Pt reports since his last time here he decided to cut a tree down and pulled a muscle in his R arm which made it difficult to complete his HEP. He does think the HEP is helping, noting increased flexibilty and ROM but still some pain.    Patient Stated Goals "release whatever is causing the issue"    Currently in Pain? No/denies    Pain Onset More  than a month ago   >1 year                              [facility] Adult PT Treatment/Exercise - 08/02/21 1445       Exercises   Exercises Neck      Neck Exercises: Machines for Strengthening   UBE (Upper Arm Bike) L2.0 x 6 min (3' each fwd & back)      Neck Exercises: Stretches   Upper Trapezius Stretch Left;2 reps;30 seconds    Levator Stretch Left;2 reps;30 seconds    Neck Stretch 2 reps;30 seconds    Neck Stretch Limitations L SCM stretch    Other Neck Stretches 3-way doorway pec stretch 2 x 30 sec each      Manual Therapy   Manual Therapy Soft tissue mobilization;Myofascial release    Manual therapy comments skilled palpation and monitoring of soft tissue during DN    Soft tissue mobilization STM/DTM to L>R UT, L LS, scalenes, SCM and pecs    Myofascial Release manual TPR to B UT, pin and stretch to L UT, LS and SCM              Trigger Point Dry Needling - 08/02/21 1445     Consent Given? Yes    Education Handout Provided Yes  Muscles Treated Head and Neck Sternocleidomastoid;Upper trapezius;Levator scapulae;Scalenes    Muscles Treated Upper Quadrant Pectoralis major    Dry Needling Comments Left    Sternocleidomastoid Response Twitch response elicited;Palpable increased muscle length    Upper Trapezius Response Twitch reponse elicited;Palpable increased muscle length    Levator Scapulae Response Twitch response elicited;Palpable increased muscle length    Scalenes Response Twitch reponse elicited;Palpable increased muscle length    Pectoralis Major Response Twitch response elicited;Palpable increased muscle length                   PT Education - 08/02/21 1529     Education Details DN rational, procedure, outcomes, potential side effects, and recommended post-treatment exercises/activity    Person(s) Educated Patient    Methods Explanation;Handout    Comprehension Verbalized understanding                 PT Long Term  Goals - 08/02/21 1451       PT LONG TERM GOAL #1   Title Patient will be independent with ongoing/advanced HEP for self-management at home    Status On-going    Target Date 08/20/21      PT LONG TERM GOAL #2   Title Improve posture and alignment with patient to demonstrate improved upright posture with posterior shoulder girdle engaged    Status On-going    Target Date 08/20/21      PT LONG TERM GOAL #3   Title Patient to improve cervical AROM to [facility] without pain provocation    Status On-going    Target Date 08/20/21      PT LONG TERM GOAL #4   Title Patient will report no sleep disturbance due to L-sided pain    Status On-going    Target Date 08/20/21      PT LONG TERM GOAL #5   Title Patient will report no limitation with ability to turn his head while driving due to neck pain or stiffness    Status On-going    Target Date 08/20/21                   Plan - 08/02/21 1451     Clinical Impression Statement Darius Mitchell reports HEP seems to be helping with improving flexibility and ROM noted, but still having some pain and tightness in L neck and upper shoulder with certain motions. He expressed interest proceeding with DN as discussed on the eval. After explanation of DN rational, procedures, outcomes and potential side effects, including precautions with DN over the lung fields, patient verbalized consent to DN treatment in conjunction with manual STM/DTM and TPR to reduce ttp/muscle tension. Muscles treated include L UT, LS, scalenes, SCM and pecs. DN produced normal response with good twitches elicited resulting in palpable reduction in pain/ttp and muscle tension. Pt educated to expect mild to moderate muscle soreness for up to 24-48 hrs and instructed to continue prescribed home exercise program and current activity level with pt verbalizing understanding of theses instructions. HEP stretches reviewed with minor clarifications necessary, but pt noting more effective stretch  following DN.    Comorbidities HTN, a-fib, hypothyroidism, HLD, anxiety and depression, diverticulosis, GERD    Rehab Potential Excellent    PT Frequency 2x / week    PT Duration 4 weeks    PT Treatment/Interventions ADLs/Self Care Home Management;Cryotherapy;Electrical Stimulation;Iontophoresis 4mg /ml Dexamethasone;Moist Heat;Traction;Ultrasound;Functional mobility training;Therapeutic activities;Therapeutic exercise;Neuromuscular re-education;Patient/family education;Manual techniques;Passive range of motion;Dry needling;Taping;Spinal Manipulations    PT Next Visit Plan Assess response to  DN; review initial HEP & possibly add SCM stretch; cervical and postural stretching and strengthening; manual STM/MFR with DN as indicated to L posterolateral neck as indicated; modalities PRN    PT Home Exercise Plan Access Code: [email or web address] (1/27)    Consulted and Agree with Plan of Care Patient             Patient will benefit from skilled therapeutic intervention in order to improve the following deficits and impairments:  Pain, Postural dysfunction, Improper body mechanics, Impaired flexibility, Impaired perceived functional ability, Increased fascial restricitons, Increased muscle spasms, Decreased range of motion, Decreased strength, Decreased activity tolerance  Visit Diagnosis: Cervicalgia  Other muscle spasm  Abnormal posture     Problem List Patient Active Problem List   Diagnosis Date Noted   Cervical strain 06/14/2021   Paroxysmal atrial fibrillation ([place]) 12/03/2018   Swelling of both lower extremities 11/29/2017   Diverticulosis of colon without hemorrhage 03/17/2016   Carpal tunnel syndrome of right wrist 10/23/2015   Low testosterone 05/15/2015   BMI 30.0-30.9,adult 05/15/2015   Screening for ischemic heart disease 04/30/2014   Visit for preventive health examination 04/30/2014   Prostate cancer screening encounter, options and risks discussed 04/30/2014   Essential  hypertension, benign 03/25/2014   Hyperlipidemia 03/25/2014   GERD (gastroesophageal reflux disease) 03/25/2014   Anxiety and depression 03/25/2014   Hypothyroidism 03/25/2014    Percival Spanish, PT 08/02/2021, 3:39 PM  [facility] [address]  [place], [place], [address] Phone: [phone]   Fax:  [phone]  Name: Darius Mitchell MRN: [id number] Date of Birth: 11/08/1950

## 2021-08-05 ENCOUNTER — Ambulatory Visit: Payer: Medicare Other | Admitting: Physical Therapy

## 2021-08-05 ENCOUNTER — Encounter: Payer: Self-pay | Admitting: Physical Therapy

## 2021-08-05 ENCOUNTER — Other Ambulatory Visit: Payer: Self-pay

## 2021-08-05 DIAGNOSIS — M542 Cervicalgia: Secondary | ICD-10-CM | POA: Diagnosis not present

## 2021-08-05 DIAGNOSIS — M62838 Other muscle spasm: Secondary | ICD-10-CM

## 2021-08-05 DIAGNOSIS — R293 Abnormal posture: Secondary | ICD-10-CM

## 2021-08-05 NOTE — Therapy (Signed)
[facility] [address]  [place], [place], [address] Phone: [phone]   Fax:  [phone]  Physical Therapy Treatment  Patient Details  Name: Darius Mitchell MRN: [id number] Date of Birth: [birth year] Referring Provider (PT): Clearance Coots, MD   Encounter Date: 08/05/2021   PT End of Session - 08/05/21 1015     Visit Number 3    Number of Visits 8    Date for PT Re-Evaluation 08/20/21    Authorization Type [employer] Medicare    PT Start Time 8299    PT Stop Time 1100    PT Time Calculation (min) 45 min    Activity Tolerance Patient tolerated treatment well    Behavior During Therapy [facility] for tasks assessed/performed             Past Medical History:  Diagnosis Date   Anxiety and depression 03/25/2014   BMI 30.0-30.9,adult 05/15/2015   Carpal tunnel syndrome of right wrist 10/23/2015   Diverticulosis of colon without hemorrhage 03/17/2016   Essential hypertension, benign 03/25/2014   GERD (gastroesophageal reflux disease)    Hyperlipidemia    Hypothyroidism    Low testosterone 05/15/2015   Paroxysmal atrial fibrillation ([place]) 12/03/2018   Prostate cancer screening encounter, options and risks discussed 04/30/2014   Screening for ischemic heart disease 04/30/2014   Swelling of both lower extremities 11/29/2017   Visit for preventive health examination 04/30/2014    Past Surgical History:  Procedure Laterality Date   APPENDECTOMY  1971   [place]    There were no vitals filed for this visit.   Subjective Assessment - 08/05/21 1018     Subjective Pt reports his neck feels bettter following the DN but still feels swollen.    Patient Stated Goals "release whatever is causing the issue"    Currently in Pain? No/denies    Pain Onset More than a month ago   >1 year                              [facility] Adult PT Treatment/Exercise - 08/05/21 1015       Exercises    Exercises Neck      Neck Exercises: Machines for Strengthening   UBE (Upper Arm Bike) L2.0 x 6 min (3' each fwd & back)      Neck Exercises: Theraband   Shoulder Extension 10 reps;Green   2 sets   Shoulder Extension Limitations cues for scap retraction and depression, tucking elbows into ribs for 1st set; 2nd set adding slight shoulder ER   standing   Rows 10 reps;Green   2 sets   Rows Limitations cues for scap retraction and depression, adding slight long arm ER to increase scap activation   standing   Shoulder External Rotation 10 reps;Green    Shoulder External Rotation Limitations hooklying over pool noodle    Horizontal ABduction 10 reps;Green    Horizontal ABduction Limitations hooklying over pool noodle    Other Theraband Exercises Green TB alt UE diagonals x 10, hooklying over pool noodle      Neck Exercises: Standing   Wall Push Ups 10 reps   2 sets     Neck Exercises: Prone   Neck Retraction 10 reps;5 secs   2 sets   Neck Retraction Limitations POE; 2nd set - retraction + rotation    W Back 10 reps    W  Back Limitations W's over green Pball    Shoulder Extension 10 reps    Shoulder Extension Limitations I's over green Pball    Other Prone Exercise T's over green Pball x 10    Other Prone Exercise Y's over green Pball x 10      Neck Exercises: Stretches   Neck Stretch 2 reps;30 seconds    Neck Stretch Limitations L SCM stretch    Other Neck Stretches Hooklying pec stretch over pool noodle x 60 sec    Other Neck Stretches Snow angels x 10 hooklying over pool noodle                     PT Education - 08/05/21 1100     Education Details HEP update    Person(s) Educated Patient    Methods Explanation;Demonstration;Verbal cues;Handout    Comprehension Verbalized understanding;Verbal cues required;Returned demonstration;Need further instruction                 PT Long Term Goals - 08/02/21 1451       PT LONG TERM GOAL #1   Title Patient will be  independent with ongoing/advanced HEP for self-management at home    Status On-going    Target Date 08/20/21      PT LONG TERM GOAL #2   Title Improve posture and alignment with patient to demonstrate improved upright posture with posterior shoulder girdle engaged    Status On-going    Target Date 08/20/21      PT LONG TERM GOAL #3   Title Patient to improve cervical AROM to [facility] without pain provocation    Status On-going    Target Date 08/20/21      PT LONG TERM GOAL #4   Title Patient will report no sleep disturbance due to L-sided pain    Status On-going    Target Date 08/20/21      PT LONG TERM GOAL #5   Title Patient will report no limitation with ability to turn his head while driving due to neck pain or stiffness    Status On-going    Target Date 08/20/21                   Plan - 08/05/21 1020     Clinical Impression Statement Darius Mitchell reports decreased pain and tightness following the DN last session but states his neck still feels swollen. He reports good comfort with the initial HEP and denies need for further review today. Provided instruction in alternative stretching and postural strengthening exercises with good tolerance reported and HEP updated to reflect exercise progression. Darius Mitchell is demonstrating good initial progress with PT.    Comorbidities HTN, a-fib, hypothyroidism, HLD, anxiety and depression, diverticulosis, GERD    Rehab Potential Excellent    PT Frequency 2x / week    PT Duration 4 weeks    PT Treatment/Interventions ADLs/Self Care Home Management;Cryotherapy;Electrical Stimulation;Iontophoresis 4mg /ml Dexamethasone;Moist Heat;Traction;Ultrasound;Functional mobility training;Therapeutic activities;Therapeutic exercise;Neuromuscular re-education;Patient/family education;Manual techniques;Passive range of motion;Dry needling;Taping;Spinal Manipulations    PT Next Visit Plan cervical and postural stretching and strengthening; manual STM/MFR with DN as  indicated to L posterolateral neck as indicated; modalities PRN    PT Home Exercise Plan Access Code: [email or web address] (1/27, updated 2/9)    Consulted and Agree with Plan of Care Patient             Patient will benefit from skilled therapeutic intervention in order to improve the following deficits and impairments:  Pain, Postural dysfunction,  Improper body mechanics, Impaired flexibility, Impaired perceived functional ability, Increased fascial restricitons, Increased muscle spasms, Decreased range of motion, Decreased strength, Decreased activity tolerance  Visit Diagnosis: Cervicalgia  Other muscle spasm  Abnormal posture     Problem List Patient Active Problem List   Diagnosis Date Noted   Cervical strain 06/14/2021   Paroxysmal atrial fibrillation ([place]) 12/03/2018   Swelling of both lower extremities 11/29/2017   Diverticulosis of colon without hemorrhage 03/17/2016   Carpal tunnel syndrome of right wrist 10/23/2015   Low testosterone 05/15/2015   BMI 30.0-30.9,adult 05/15/2015   Screening for ischemic heart disease 04/30/2014   Visit for preventive health examination 04/30/2014   Prostate cancer screening encounter, options and risks discussed 04/30/2014   Essential hypertension, benign 03/25/2014   Hyperlipidemia 03/25/2014   GERD (gastroesophageal reflux disease) 03/25/2014   Anxiety and depression 03/25/2014   Hypothyroidism 03/25/2014    Percival Spanish, PT 08/05/2021, 12:11 PM  [facility] [address]  [place], [place], [address] Phone: [phone]   Fax:  [phone]  Name: Darius Mitchell MRN: [id number] Date of Birth: 04/16/1951

## 2021-08-05 NOTE — Patient Instructions (Signed)
° ° ° °  Access Code: [email or web address] URL: [email or web address] Date: 08/05/2021 Prepared by: Annie Paras  Exercises Standing Cervical Retraction - 3 x daily - 7 x weekly - 2 sets - 10 reps - 5 sec hold Seated Gentle Upper Trapezius Stretch - 2-3 x daily - 7 x weekly - 3 reps - 30 sec hold Gentle Levator Scapulae Stretch - 2-3 x daily - 7 x weekly - 3 reps - 30 sec hold Doorway Pec Stretch at 60 Elevation - 2-3 x daily - 7 x weekly - 3 reps - 30 sec hold Doorway Pec Stretch at 90 Degrees Abduction - 2-3 x daily - 7 x weekly - 3 reps - 30 sec hold Doorway Pec Stretch at 120 Degrees Abduction - 2-3 x daily - 7 x weekly - 3 reps - 30 sec hold Seated Scapular Retraction - 2 x daily - 7 x weekly - 2 sets - 10 reps - 3-5 sec hold Standing Bilateral Low Shoulder Row with Anchored Resistance - 1 x daily - 7 x weekly - 2 sets - 10 reps - 5 sec hold Scapular Retraction with Resistance Advanced - 1 x daily - 7 x weekly - 2 sets - 10 reps - 5 sec hold Sternocleidomastoid Stretch - 2-3 x daily - 7 x weekly - 3 reps - 30 sec hold Supine Chest Stretch on Foam Roll - 1 x daily - 7 x weekly - 2 sets - 10 reps - 3 sec hold Supine Shoulder External Rotation on Foam Roll with Theraband - 1 x daily - 7 x weekly - 2 sets - 10 reps - 3 sec hold Supine Shoulder Horizontal Abduction with Resistance - 1 x daily - 7 x weekly - 2 sets - 10 reps - 3 sec hold Supine PNF D2 Flexion with Resistance - 1 x daily - 7 x weekly - 2 sets - 10 reps - 3 sec hold Cervical Retraction Prone on Elbows - 1 x daily - 7 x weekly - 2 sets - 10 reps - 3 sec hold  Patient Education Trigger Point Dry Needling

## 2021-08-09 ENCOUNTER — Ambulatory Visit: Payer: Medicare Other

## 2021-08-17 ENCOUNTER — Ambulatory Visit: Payer: Medicare Other | Admitting: Family Medicine

## 2021-08-20 DIAGNOSIS — H35352 Cystoid macular degeneration, left eye: Secondary | ICD-10-CM | POA: Diagnosis not present

## 2021-09-03 DIAGNOSIS — H35352 Cystoid macular degeneration, left eye: Secondary | ICD-10-CM | POA: Diagnosis not present

## 2021-09-23 ENCOUNTER — Other Ambulatory Visit: Payer: Self-pay | Admitting: Family Medicine

## 2021-09-23 DIAGNOSIS — F419 Anxiety disorder, unspecified: Secondary | ICD-10-CM

## 2021-11-05 DIAGNOSIS — H35352 Cystoid macular degeneration, left eye: Secondary | ICD-10-CM | POA: Diagnosis not present

## 2021-11-19 ENCOUNTER — Telehealth: Payer: Self-pay | Admitting: Family Medicine

## 2021-11-19 NOTE — Telephone Encounter (Signed)
Left message for patient to call back and schedule Medicare Annual Wellness Visit (AWV).   Please offer to do virtually or by telephone.  Left office number and my jabber #[phone].  AWVI eligible as of 06/27/2021  Please schedule at anytime with Nurse Health Advisor.   

## 2021-12-14 DIAGNOSIS — L570 Actinic keratosis: Secondary | ICD-10-CM | POA: Diagnosis not present

## 2021-12-14 DIAGNOSIS — D225 Melanocytic nevi of trunk: Secondary | ICD-10-CM | POA: Diagnosis not present

## 2021-12-14 DIAGNOSIS — D485 Neoplasm of uncertain behavior of skin: Secondary | ICD-10-CM | POA: Diagnosis not present

## 2021-12-14 DIAGNOSIS — L578 Other skin changes due to chronic exposure to nonionizing radiation: Secondary | ICD-10-CM | POA: Diagnosis not present

## 2021-12-14 DIAGNOSIS — L814 Other melanin hyperpigmentation: Secondary | ICD-10-CM | POA: Diagnosis not present

## 2021-12-14 DIAGNOSIS — L821 Other seborrheic keratosis: Secondary | ICD-10-CM | POA: Diagnosis not present

## 2021-12-14 DIAGNOSIS — D2362 Other benign neoplasm of skin of left upper limb, including shoulder: Secondary | ICD-10-CM | POA: Diagnosis not present

## 2021-12-14 DIAGNOSIS — D2262 Melanocytic nevi of left upper limb, including shoulder: Secondary | ICD-10-CM | POA: Diagnosis not present

## 2021-12-20 DIAGNOSIS — H524 Presbyopia: Secondary | ICD-10-CM | POA: Diagnosis not present

## 2021-12-21 ENCOUNTER — Other Ambulatory Visit: Payer: Self-pay | Admitting: Cardiology

## 2021-12-22 ENCOUNTER — Other Ambulatory Visit (HOSPITAL_BASED_OUTPATIENT_CLINIC_OR_DEPARTMENT_OTHER): Payer: Self-pay

## 2021-12-22 MED ORDER — APIXABAN 5 MG PO TABS
ORAL_TABLET | Freq: Two times a day (BID) | ORAL | 1 refills | Status: DC
  Filled 2021-12-22 – 2022-01-03 (×2): qty 180, 90d supply, fill #0
  Filled 2022-06-06: qty 60, 30d supply, fill #1
  Filled 2022-07-23 – 2022-08-24 (×2): qty 60, 30d supply, fill #2
  Filled 2022-10-03: qty 60, 30d supply, fill #3

## 2021-12-22 NOTE — Telephone Encounter (Signed)
Prescription refill request for Eliquis received. Indication: PAF Last office visit:  05/28/21  Nelta Numbers MD Scr: 1.02 on 04/27/22 Age: 71 Weight: 89.4kg  Based on above findings Eliquis '5mg'$  twice daily is the appropriate dose.  Refill approved.

## 2021-12-31 ENCOUNTER — Other Ambulatory Visit (HOSPITAL_BASED_OUTPATIENT_CLINIC_OR_DEPARTMENT_OTHER): Payer: Self-pay

## 2022-01-03 ENCOUNTER — Other Ambulatory Visit (HOSPITAL_BASED_OUTPATIENT_CLINIC_OR_DEPARTMENT_OTHER): Payer: Self-pay

## 2022-01-06 ENCOUNTER — Encounter: Payer: Self-pay | Admitting: Family Medicine

## 2022-02-27 ENCOUNTER — Other Ambulatory Visit: Payer: Self-pay | Admitting: Family Medicine

## 2022-03-04 ENCOUNTER — Telehealth: Payer: Self-pay | Admitting: Family Medicine

## 2022-03-04 NOTE — Telephone Encounter (Signed)
Left message for patient to call back and schedule Medicare Annual Wellness Visit (AWV).   Please offer to do virtually or by telephone.  Left office number and my jabber [phone].  AWVI eligilble as of 06/27/2021   Please schedule at anytime with Nurse Health Advisor.

## 2022-06-06 ENCOUNTER — Other Ambulatory Visit: Payer: Self-pay

## 2022-06-07 ENCOUNTER — Other Ambulatory Visit (HOSPITAL_BASED_OUTPATIENT_CLINIC_OR_DEPARTMENT_OTHER): Payer: Self-pay

## 2022-06-07 ENCOUNTER — Other Ambulatory Visit: Payer: Self-pay

## 2022-06-09 ENCOUNTER — Other Ambulatory Visit (HOSPITAL_BASED_OUTPATIENT_CLINIC_OR_DEPARTMENT_OTHER): Payer: Self-pay

## 2022-07-19 ENCOUNTER — Encounter: Payer: Self-pay | Admitting: Family Medicine

## 2022-07-19 ENCOUNTER — Ambulatory Visit (INDEPENDENT_AMBULATORY_CARE_PROVIDER_SITE_OTHER): Payer: Medicare Other | Admitting: Family Medicine

## 2022-07-19 VITALS — BP 106/62 | HR 82 | Temp 97.6°F | Ht 71.0 in | Wt 208.5 lb

## 2022-07-19 DIAGNOSIS — Z0000 Encounter for general adult medical examination without abnormal findings: Secondary | ICD-10-CM

## 2022-07-19 DIAGNOSIS — E039 Hypothyroidism, unspecified: Secondary | ICD-10-CM

## 2022-07-19 DIAGNOSIS — I7300 Raynaud's syndrome without gangrene: Secondary | ICD-10-CM

## 2022-07-19 DIAGNOSIS — E782 Mixed hyperlipidemia: Secondary | ICD-10-CM | POA: Diagnosis not present

## 2022-07-19 DIAGNOSIS — Z125 Encounter for screening for malignant neoplasm of prostate: Secondary | ICD-10-CM

## 2022-07-19 MED ORDER — AMLODIPINE BESYLATE 5 MG PO TABS: 5.0000 mg | ORAL_TABLET | Freq: Every day | ORAL | 3 refills | Status: DC

## 2022-07-19 NOTE — Progress Notes (Signed)
Chief Complaint  Patient presents with   Annual Exam    Well Male Darius Mitchell is here for a complete physical.   His last physical was >1 year ago.  Current diet: in general, a "healthy" diet.   Current exercise: active in yard Weight trend: gained a little Fatigue out of ordinary? No. Seat belt? Yes.   Advanced directive? Yes  Health maintenance Shingrix- Yes Colonoscopy- Yes Tetanus- Due Hep C- Yes Pneumonia vaccine- Yes  Hands/feet get very cold during the winter. No pain. No issues walking. Happened in parents, sometimes in his twin brother as well. Has never been on any medicine for this or has had a formal dx. Currently on HCTZ and losartan for BP. He does not have this issue in the warmer months.   Past Medical History:  Diagnosis Date   Anxiety and depression 03/25/2014   BMI 30.0-30.9,adult 05/15/2015   Carpal tunnel syndrome of right wrist 10/23/2015   Diverticulosis of colon without hemorrhage 03/17/2016   Essential hypertension, benign 03/25/2014   GERD (gastroesophageal reflux disease)    Hyperlipidemia    Hypothyroidism    Low testosterone 05/15/2015   Paroxysmal atrial fibrillation ([place]) 12/03/2018   Prostate cancer screening encounter, options and risks discussed 04/30/2014   Screening for ischemic heart disease 04/30/2014   Swelling of both lower extremities 11/29/2017   Visit for preventive health examination 04/30/2014     Past Surgical History:  Procedure Laterality Date   [place]    Medications  Current Outpatient Medications on File Prior to Visit  Medication Sig Dispense Refill   apixaban (ELIQUIS) 5 MG TABS tablet Take 1 tablet by mouth 2 (two) times daily. 180 tablet 1   atorvastatin (LIPITOR) 80 MG tablet TAKE 1 TABLET BY MOUTH EVERY DAY 90 tablet 3   ezetimibe (ZETIA) 10 MG tablet TAKE 1 TABLET BY MOUTH EVERY DAY 90 tablet 3   hydrochlorothiazide (HYDRODIURIL) 25 MG tablet TAKE 1 TABLET (25 MG TOTAL)  BY MOUTH DAILY. 90 tablet 3   levothyroxine (SYNTHROID) 125 MCG tablet TAKE 1 TABLET BY MOUTH ONCE DAILY BEFORE BREAKFAST 90 tablet 1   losartan (COZAAR) 100 MG tablet TAKE 1 TABLET BY MOUTH EVERY DAY 90 tablet 3   Multiple Vitamins-Minerals (CENTRUM SILVER ULTRA MENS) TABS Take 1 tablet by mouth daily. Unknown strength     omeprazole (PRILOSEC) 40 MG capsule TAKE 1 CAPSULE BY MOUTH EVERY DAY 90 capsule 3   PARoxetine (PAXIL) 30 MG tablet TAKE 1 TABLET BY MOUTH EVERY DAY 90 tablet 3   sildenafil (VIAGRA) 100 MG tablet TAKE 1/2 TO 1 TABLET BY MOUTH DAILY AS NEEDED FOR ERECTILE DYSFUNCTION (Patient taking differently: Take 50-100 mg by mouth as needed for erectile dysfunction.) 10 tablet 5    Allergies Allergies  Allergen Reactions   Codeine Nausea Only   Niacin And Related     Family History Family History  Problem Relation Age of Onset   Stroke Mother 30       Deceased   Heart attack Mother    Lymphoma Father 71       Deceased   Heart disease Father    Stroke Maternal Grandmother    Stomach cancer Paternal Grandfather    Healthy Brother        x2   Healthy Sister    Hypertension Daughter    Healthy Daughter     Review of Systems: Constitutional:  no fevers Eye:  no  recent significant change in vision Ears:  No changes in hearing Nose/Mouth/Throat:  no complaints of nasal congestion, no sore throat Cardiovascular: no chest pain Respiratory:  No shortness of breath Gastrointestinal:  No change in bowel habits GU:  No frequency Integumentary:  no abnormal skin lesions reported Neurologic:  no headaches Endocrine:  denies unexplained weight changes  Exam BP 106/62 (BP Location: Left Arm, Patient Position: Sitting, Cuff Size: Normal)   Pulse 82   Temp 97.6 F (36.4 C) (Oral)   Ht '5\' 11"'$  (1.803 m)   Wt 208 lb 8 oz (94.6 kg)   SpO2 99%   BMI 29.08 kg/m  General:  well developed, well nourished, in no apparent distress Skin:  no significant moles, warts, or  growths Head:  no masses, lesions, or tenderness Eyes:  pupils equal and round, sclera anicteric without injection Ears:  canals without lesions, TMs shiny without retraction, no obvious effusion, no erythema Nose:  nares patent, mucosa normal Throat/Pharynx:  lips and gingiva without lesion; tongue and uvula midline; non-inflamed pharynx; no exudates or postnasal drainage Lungs:  clear to auscultation, breath sounds equal bilaterally, no respiratory distress Cardio:  regular rate and rhythm, no LE edema or bruits Rectal: Deferred GI: BS+, S, NT, ND, no masses or organomegaly Musculoskeletal:  symmetrical muscle groups noted without atrophy or deformity Neuro:  gait normal; deep tendon reflexes normal and symmetric Psych: well oriented with normal range of affect and appropriate judgment/insight  Assessment and Plan  Well adult exam  Hypothyroidism, unspecified type - Plan: TSH, T4, free  Mixed hyperlipidemia - Plan: Comprehensive metabolic panel, Lipid panel, CBC  Screening for prostate cancer - Plan: PSA, Medicare ( [place] only)  Raynaud's phenomenon without gangrene - Plan: amLODipine (NORVASC) 5 MG tablet   Well 72 y.o. male. Counseled on diet and exercise. Other orders as above. Tdap recommended.  Raynaud's: Chronic, uncontrolled. Stop HCTZ, start Norvasc 5 mg/d. F/u in 1 mo to reck. Keep hands/feet as warm as possible.  Advanced directive form requested today.  The patient voiced understanding and agreement to the plan.  [place], DO 07/19/22 1:33 PM

## 2022-07-19 NOTE — Patient Instructions (Addendum)
Give [place] 2-3 business days to get the results of your labs back.   Keep the diet clean and stay active.  Please get me a copy of your advanced directive form at your convenience.   Please get a tetanus booster at the pharmacy.  Keep an eye on your blood pressure over the next month. If <105 on the top, let me know.   Let us know if you need anything.

## 2022-07-20 LAB — COMPREHENSIVE METABOLIC PANEL
ALT: 20 U/L (ref 0–53)
AST: 22 U/L (ref 0–37)
Albumin: 4.3 g/dL (ref 3.5–5.2)
Alkaline Phosphatase: 52 U/L (ref 39–117)
BUN: 14 mg/dL (ref 6–23)
CO2: 31 mEq/L (ref 19–32)
Calcium: 9.4 mg/dL (ref 8.4–10.5)
Chloride: 98 mEq/L (ref 96–112)
Creatinine, Ser: 0.97 mg/dL (ref 0.40–1.50)
GFR: 77.43 mL/min (ref >60.00)
Glucose, Bld: 96 mg/dL (ref 70–99)
Potassium: 4 mEq/L (ref 3.5–5.1)
Sodium: 135 mEq/L (ref 135–145)
Total Bilirubin: 0.9 mg/dL (ref 0.2–1.2)
Total Protein: 6.9 g/dL (ref 6.0–8.3)

## 2022-07-20 LAB — LIPID PANEL
Cholesterol: 119 mg/dL (ref 0–200)
HDL: 44.4 mg/dL
LDL Cholesterol: 58 mg/dL (ref 0–99)
NonHDL: 74.58
Total CHOL/HDL Ratio: 3
Triglycerides: 83 mg/dL (ref 0.0–149.0)
VLDL: 16.6 mg/dL (ref 0.0–40.0)

## 2022-07-20 LAB — T4, FREE: Free T4: 0.88 ng/dL (ref 0.60–1.60)

## 2022-07-20 LAB — CBC
HCT: 39.1 % (ref 39.0–52.0)
Hemoglobin: 13.6 g/dL (ref 13.0–17.0)
MCHC: 34.7 g/dL (ref 30.0–36.0)
MCV: 92.9 fl (ref 78.0–100.0)
Platelets: 138 K/uL — ABNORMAL LOW (ref 150.0–400.0)
RBC: 4.21 Mil/uL — ABNORMAL LOW (ref 4.22–5.81)
RDW: 13.4 % (ref 11.5–15.5)
WBC: 5.7 K/uL (ref 4.0–10.5)

## 2022-07-20 LAB — TSH: TSH: 2.42 u[IU]/mL (ref 0.35–5.50)

## 2022-07-20 LAB — PSA, MEDICARE: PSA: 0.26 ng/ml (ref 0.10–4.00)

## 2022-08-01 ENCOUNTER — Other Ambulatory Visit (HOSPITAL_BASED_OUTPATIENT_CLINIC_OR_DEPARTMENT_OTHER): Payer: Self-pay

## 2022-08-03 ENCOUNTER — Ambulatory Visit: Payer: Medicare Other | Attending: Cardiology | Admitting: Cardiology

## 2022-08-03 ENCOUNTER — Encounter: Payer: Self-pay | Admitting: Cardiology

## 2022-08-03 VITALS — BP 122/84 | HR 62 | Ht 71.0 in | Wt 204.0 lb

## 2022-08-03 DIAGNOSIS — K219 Gastro-esophageal reflux disease without esophagitis: Secondary | ICD-10-CM | POA: Diagnosis not present

## 2022-08-03 DIAGNOSIS — I10 Essential (primary) hypertension: Secondary | ICD-10-CM

## 2022-08-03 DIAGNOSIS — D485 Neoplasm of uncertain behavior of skin: Secondary | ICD-10-CM | POA: Insufficient documentation

## 2022-08-03 DIAGNOSIS — L578 Other skin changes due to chronic exposure to nonionizing radiation: Secondary | ICD-10-CM | POA: Insufficient documentation

## 2022-08-03 DIAGNOSIS — L570 Actinic keratosis: Secondary | ICD-10-CM | POA: Insufficient documentation

## 2022-08-03 DIAGNOSIS — E782 Mixed hyperlipidemia: Secondary | ICD-10-CM

## 2022-08-03 DIAGNOSIS — I480 Paroxysmal atrial fibrillation: Secondary | ICD-10-CM | POA: Diagnosis not present

## 2022-08-03 NOTE — Patient Instructions (Addendum)
Medication Instructions:  Your physician recommends that you continue on your current medications as directed. Please refer to the Current Medication list given to you today.  *If you need a refill on your cardiac medications before your next appointment, please call your pharmacy*   Lab Work: None Ordered If you have labs (blood work) drawn today and your tests are completely normal, you will receive your results only by: [place] (if you have MyChart) OR A paper copy in the mail If you have any lab test that is abnormal or we need to change your treatment, we will call you to review the results.   Testing/Procedures: None Ordered   Follow-Up: At [facility], you and your health needs are our priority.  As part of our continuing mission to provide you with exceptional heart care, we have created designated Provider Care Teams.  These Care Teams include your primary Cardiologist (physician) and Advanced Practice Providers (APPs -  Physician Assistants and Nurse Practitioners) who all work together to provide you with the care you need, when you need it.  We recommend signing up for the patient portal called "MyChart".  Sign up information is provided on this After Visit Summary.  MyChart is used to connect with patients for Virtual Visits (Telemedicine).  Patients are able to view lab/test results, encounter notes, upcoming appointments, etc.  Non-urgent messages can be sent to your provider as well.   To learn more about what you can do with MyChart, go to [email or web address].    Your next appointment:   12 month(s)  The format for your next appointment:   In Person  Provider:   Jenne Campus, MD    Other Instructions NA   This visit was accompanied by Enrigue Catena.

## 2022-08-03 NOTE — Progress Notes (Signed)
Cardiology Office Note:    Date:  08/03/2022   ID:  Darius Mitchell, DOB 05/18/1951, MRN [id number]  PCP:  [name], DO  Cardiologist:  Jenne Campus, MD    Referring MD: [name]*   Chief Complaint  Patient presents with   annual follow up    History of Present Illness:    Darius Mitchell is a 72 y.o. male with past medical history significant for paroxysmal atrial fibrillation, he is anticoagulated, essential hypertension, dyslipidemia. He is coming today to months for follow-up.  He is doing very well.  He denies have any chest pain tightness squeezing pressure burning chest.  He is very happy with his retirement enjoying working at home very busy no trouble  Past Medical History:  Diagnosis Date   Anxiety and depression 03/25/2014   BMI 30.0-30.9,adult 05/15/2015   Carpal tunnel syndrome of right wrist 10/23/2015   Diverticulosis of colon without hemorrhage 03/17/2016   Essential hypertension, benign 03/25/2014   GERD (gastroesophageal reflux disease)    Hyperlipidemia    Hypothyroidism    Low testosterone 05/15/2015   Paroxysmal atrial fibrillation ([place]) 12/03/2018   Prostate cancer screening encounter, options and risks discussed 04/30/2014   Screening for ischemic heart disease 04/30/2014   Swelling of both lower extremities 11/29/2017   Visit for preventive health examination 04/30/2014    Past Surgical History:  Procedure Laterality Date   APPENDECTOMY  1971   WISDOM TOOTH EXTRACTION  1972    Current Medications: Current Meds  Medication Sig   amLODipine (NORVASC) 5 MG tablet Take 1 tablet (5 mg total) by mouth daily.   apixaban (ELIQUIS) 5 MG TABS tablet Take 1 tablet by mouth 2 (two) times daily. (Patient taking differently: Take 5 mg by mouth 2 (two) times daily.)   atorvastatin (LIPITOR) 80 MG tablet TAKE 1 TABLET BY MOUTH EVERY DAY (Patient taking differently: Take 80 mg by mouth daily.)   ezetimibe (ZETIA) 10 MG tablet TAKE 1 TABLET  BY MOUTH EVERY DAY (Patient taking differently: Take 10 mg by mouth daily.)   levothyroxine (SYNTHROID) 125 MCG tablet TAKE 1 TABLET BY MOUTH ONCE DAILY BEFORE BREAKFAST (Patient taking differently: Take 125 mcg by mouth daily before breakfast.)   losartan (COZAAR) 100 MG tablet TAKE 1 TABLET BY MOUTH EVERY DAY (Patient taking differently: Take 100 mg by mouth daily.)   Multiple Vitamins-Minerals (CENTRUM SILVER ULTRA MENS) TABS Take 1 tablet by mouth daily. Unknown strength   omeprazole (PRILOSEC) 40 MG capsule TAKE 1 CAPSULE BY MOUTH EVERY DAY (Patient taking differently: Take 40 mg by mouth daily.)   PARoxetine (PAXIL) 30 MG tablet TAKE 1 TABLET BY MOUTH EVERY DAY (Patient taking differently: Take 30 mg by mouth daily.)   [DISCONTINUED] metoprolol succinate (TOPROL-XL) 25 MG 24 hr tablet TAKE 1 TABLET BY MOUTH ONCE DAILY   [DISCONTINUED] sildenafil (VIAGRA) 100 MG tablet TAKE 1/2 TO 1 TABLET BY MOUTH DAILY AS NEEDED FOR ERECTILE DYSFUNCTION (Patient taking differently: Take 50-100 mg by mouth as needed for erectile dysfunction.)     Allergies:   Codeine and Niacin and related   Social History   Socioeconomic History   Marital status: Married    Spouse name: Not on file   Number of children: Not on file   Years of education: Not on file   Highest education level: Not on file  Occupational History   Not on file  Tobacco Use   Smoking status: Former    Packs/day: 0.25  Years: 25.00    Total pack years: 6.25    Types: Cigarettes    Quit date: 05/28/2019    Years since quitting: 3.1   Smokeless tobacco: Never  Vaping Use   Vaping Use: Never used  Substance and Sexual Activity   Alcohol use: Never   Drug use: Never   Sexual activity: Not on file  Other Topics Concern   Not on file  Social History Narrative   Not on file   Social Determinants of Health   Financial Resource Strain: Not on file  Food Insecurity: Not on file  Transportation Needs: Not on file  Physical  Activity: Not on file  Stress: Not on file  Social Connections: Not on file     Family History: The patient's family history includes Healthy in his brother, daughter, and sister; Heart attack in his mother; Heart disease in his father; Hypertension in his daughter; Lymphoma (age of onset: 29) in his father; Stomach cancer in his paternal grandfather; Stroke in his maternal grandmother; Stroke (age of onset: 65) in his mother. ROS:   Please see the history of present illness.    All 14 point review of systems negative except as described per history of present illness  EKGs/Labs/Other Studies Reviewed:      Recent Labs: 07/19/2022: ALT 20; BUN 14; Creatinine, Ser 0.97; Hemoglobin 13.6; Platelets 138.0; Potassium 4.0; Sodium 135; TSH 2.42  Recent Lipid Panel    Component Value Date/Time   CHOL 119 07/19/2022 1318   TRIG 83.0 07/19/2022 1318   HDL 44.40 07/19/2022 1318   CHOLHDL 3 07/19/2022 1318   VLDL 16.6 07/19/2022 1318   LDLCALC 58 07/19/2022 1318   LDLCALC 112 (H) 03/31/2020 0837   LDLDIRECT 94.0 10/24/2019 0733    Physical Exam:    VS:  BP 122/84 (BP Location: Left Arm, Patient Position: Sitting)   Pulse 62   Ht 5' 11"$  (1.803 m)   Wt 204 lb (92.5 kg)   SpO2 99%   BMI 28.45 kg/m     Wt Readings from Last 3 Encounters:  08/03/22 204 lb (92.5 kg)  07/19/22 208 lb 8 oz (94.6 kg)  06/14/21 187 lb (84.8 kg)     GEN:  Well nourished, well developed in no acute distress HEENT: Normal NECK: No JVD; No carotid bruits LYMPHATICS: No lymphadenopathy CARDIAC: RRR, no murmurs, no rubs, no gallops RESPIRATORY:  Clear to auscultation without rales, wheezing or rhonchi  ABDOMEN: Soft, non-tender, non-distended MUSCULOSKELETAL:  No edema; No deformity  SKIN: Warm and dry LOWER EXTREMITIES: no swelling NEUROLOGIC:  Alert and oriented x 3 PSYCHIATRIC:  Normal affect   ASSESSMENT:    1. Paroxysmal atrial fibrillation (HCC)   2. Essential hypertension, benign   3.  Gastroesophageal reflux disease without esophagitis   4. Mixed hyperlipidemia    PLAN:    In order of problems listed above:  Paroxysmal atrial fibrillation maintained sinus rhythm he is anticoagulated which I will continue his CHA2DS2-VASc equals 3. Dyslipidemia I did review his K PN data from July 19, 2022 LDL 58 HDL 44 he is on Lipitor 80 as well as Zetia 10 which I will continue. Gastroesophageal reflux disease denies have any problem from that aspect. Overall doing very well I congratulated him for his retirement and encouraged him to be more active Enrigue Catena has been chaperone me during entire visit  Medication Adjustments/Labs and Tests Ordered: Current medicines are reviewed at length with the patient today.  Concerns regarding medicines are outlined  above.  Orders Placed This Encounter  Procedures   EKG 12-Lead   Medication changes: No orders of the defined types were placed in this encounter.   Signed, Park Liter, MD, [facility] 08/03/2022 3:46 PM    [place]

## 2022-08-10 ENCOUNTER — Encounter: Payer: Self-pay | Admitting: Cardiology

## 2022-08-23 ENCOUNTER — Encounter (HOSPITAL_BASED_OUTPATIENT_CLINIC_OR_DEPARTMENT_OTHER): Payer: Self-pay

## 2022-08-23 ENCOUNTER — Other Ambulatory Visit: Payer: Self-pay

## 2022-08-23 ENCOUNTER — Emergency Department (HOSPITAL_BASED_OUTPATIENT_CLINIC_OR_DEPARTMENT_OTHER): Payer: Medicare Other

## 2022-08-23 ENCOUNTER — Emergency Department (HOSPITAL_BASED_OUTPATIENT_CLINIC_OR_DEPARTMENT_OTHER)
Admission: EM | Admit: 2022-08-23 | Discharge: 2022-08-23 | Disposition: A | Discharge status: Home / Self Care | Payer: Medicare Other | Attending: Emergency Medicine | Admitting: Emergency Medicine

## 2022-08-23 DIAGNOSIS — S0031XA Abrasion of nose, initial encounter: Secondary | ICD-10-CM | POA: Diagnosis not present

## 2022-08-23 DIAGNOSIS — Z7901 Long term (current) use of anticoagulants: Secondary | ICD-10-CM | POA: Diagnosis not present

## 2022-08-23 DIAGNOSIS — W01198A Fall on same level from slipping, tripping and stumbling with subsequent striking against other object, initial encounter: Secondary | ICD-10-CM | POA: Diagnosis not present

## 2022-08-23 DIAGNOSIS — Z79899 Other long term (current) drug therapy: Secondary | ICD-10-CM | POA: Diagnosis not present

## 2022-08-23 DIAGNOSIS — S0081XA Abrasion of other part of head, initial encounter: Secondary | ICD-10-CM | POA: Insufficient documentation

## 2022-08-23 DIAGNOSIS — S0990XA Unspecified injury of head, initial encounter: Secondary | ICD-10-CM

## 2022-08-23 DIAGNOSIS — T148XXA Other injury of unspecified body region, initial encounter: Secondary | ICD-10-CM

## 2022-08-23 DIAGNOSIS — Y92812 Truck as the place of occurrence of the external cause: Secondary | ICD-10-CM | POA: Diagnosis not present

## 2022-08-23 NOTE — ED Triage Notes (Signed)
Pt to ED by POV from home c/o abrasion to forehead and small lac to his nose following a mechanical fall getting out of a truck. Pt denies LOC or any NV. Pt endorses taking eliquis, bleeding is controlled at this time. Arrives A+O, VSS.

## 2022-08-23 NOTE — Discharge Instructions (Signed)
You were seen in the emergency department for evaluation of head injury while taking a blood thinner.  You had a CAT scan of your brain that did not show any internal bleeding.  You have abrasions of your forehead and nose.  You can use soap and water to these areas and apply some bacitracin.  Watch for signs of infection.  You can use Tylenol as needed for pain and apply ice to the affected areas.  Return if any worsening or concerning symptoms.

## 2022-08-23 NOTE — ED Provider Notes (Signed)
[place] EMERGENCY DEPARTMENT AT [facility] [place] Provider Note   CSN: [id number] Arrival date & time: 08/23/22  1959     History {Add pertinent medical, surgical, social history, OB history to HPI:1} Chief Complaint  Patient presents with   Darius Mitchell is a 72 y.o. male.  He has a history of A-fib on Eliquis.  He was getting out of his wife's truck around noon today when he slipped on the running board and fell down hitting his head on the concrete.  There was no loss of consciousness.  He has abrasions over his forehead and a small abrasion over the bridge of his nose where his glasses hit him.  He has no complaints no headache no dizziness.  His family members made him come appear and get a CAT scan due to him being on a blood thinner.  No neck pain no other complaints.  The history is provided by the patient.  Head Injury Location:  Frontal Time since incident:  9 hours Mechanism of injury: fall   Fall:    Fall occurred:  Standing   Impact surface:  Product manager of impact:  Head Pain details:    Progression:  Resolved Chronicity:  New Relieved by:  None tried Worsened by:  Nothing Ineffective treatments:  None tried Associated symptoms: no blurred vision, no double vision, no headaches, no loss of consciousness, no neck pain and no vomiting        Home Medications Prior to Admission medications   Medication Sig Start Date End Date Taking? Authorizing Provider  amLODipine (NORVASC) 5 MG tablet Take 1 tablet (5 mg total) by mouth daily. 07/19/22   [name], DO  apixaban (ELIQUIS) 5 MG TABS tablet Take 1 tablet by mouth 2 (two) times daily. Patient taking differently: Take 5 mg by mouth 2 (two) times daily. 12/22/21 12/22/22  Park Liter, MD  atorvastatin (LIPITOR) 80 MG tablet TAKE 1 TABLET BY MOUTH EVERY DAY Patient taking differently: Take 80 mg by mouth daily. 09/23/21   [name], DO  ezetimibe (ZETIA) 10 MG  tablet TAKE 1 TABLET BY MOUTH EVERY DAY Patient taking differently: Take 10 mg by mouth daily. 09/23/21   [name], DO  levothyroxine (SYNTHROID) 125 MCG tablet TAKE 1 TABLET BY MOUTH ONCE DAILY BEFORE BREAKFAST Patient taking differently: Take 125 mcg by mouth daily before breakfast. 03/01/22 03/01/23  Wendling, Crosby Oyster, DO  losartan (COZAAR) 100 MG tablet TAKE 1 TABLET BY MOUTH EVERY DAY Patient taking differently: Take 100 mg by mouth daily. 09/23/21   [name], DO  Multiple Vitamins-Minerals (CENTRUM SILVER ULTRA MENS) TABS Take 1 tablet by mouth daily. Unknown strength    [provider]  omeprazole (PRILOSEC) 40 MG capsule TAKE 1 CAPSULE BY MOUTH EVERY DAY Patient taking differently: Take 40 mg by mouth daily. 09/23/21   [name], DO  PARoxetine (PAXIL) 30 MG tablet TAKE 1 TABLET BY MOUTH EVERY DAY Patient taking differently: Take 30 mg by mouth daily. 09/23/21   [name], DO  metoprolol succinate (TOPROL-XL) 25 MG 24 hr tablet TAKE 1 TABLET BY MOUTH ONCE DAILY 12/18/19 05/18/20  Park Liter, MD      Allergies    Codeine and Niacin and related    Review of Systems   Review of Systems  Eyes:  Negative for blurred vision and double vision.  Gastrointestinal:  Negative for vomiting.  Musculoskeletal:  Negative for neck pain.  Skin:  Positive for wound.  Neurological:  Negative for loss of consciousness and headaches.    Physical Exam Updated Vital Signs BP (!) 138/102 (BP Location: Right Arm)   Pulse 84   Temp 98.2 F (36.8 C) (Oral)   Resp 18   Ht '5\' 11"'$  (1.803 m)   Wt 87.1 kg   SpO2 99%   BMI 26.78 kg/m  Physical Exam Vitals and nursing note reviewed.  Constitutional:      General: He is not in acute distress.    Appearance: Normal appearance. He is well-developed.  HENT:     Head: Normocephalic.     Comments: Has abrasions over his forehead and a small abrasion over the bridge of his nose.   There is no significant swelling.  No facial tenderness. Eyes:     Conjunctiva/sclera: Conjunctivae normal.  Cardiovascular:     Rate and Rhythm: Normal rate and regular rhythm.     Heart sounds: No murmur heard. Pulmonary:     Effort: Pulmonary effort is normal. No respiratory distress.     Breath sounds: Normal breath sounds.  Abdominal:     Palpations: Abdomen is soft.     Tenderness: There is no abdominal tenderness.  Musculoskeletal:        General: No deformity. Normal range of motion.     Cervical back: Neck supple. No tenderness.  Skin:    General: Skin is warm and dry.     Capillary Refill: Capillary refill takes less than 2 seconds.  Neurological:     General: No focal deficit present.     Mental Status: He is alert.     ED Results / Procedures / Treatments   Labs (all labs ordered are listed, but only abnormal results are displayed) Labs Reviewed - No data to display  EKG None  Radiology No results found.  Procedures Procedures  {Document cardiac monitor, telemetry assessment procedure when appropriate:1}  Medications Ordered in ED Medications - No data to display  ED Course/ Medical Decision Making/ A&P   {   Click here for ABCD2, HEART and other calculatorsREFRESH Note before signing :1}                          Medical Decision Making Amount and/or Complexity of Data Reviewed Radiology: ordered.   This patient complains of ***; this involves an extensive number of treatment Options and is a complaint that carries with it a high risk of complications and morbidity. The differential includes ***  I ordered, reviewed and interpreted labs, which included *** I ordered medication *** and reviewed PMP when indicated. I ordered imaging studies which included *** and I independently    visualized and interpreted imaging which showed *** Additional history obtained from *** Previous records obtained and reviewed *** I consulted *** and discussed lab  and imaging findings and discussed disposition.  Cardiac monitoring reviewed, *** Social determinants considered, *** Critical Interventions: ***  After the interventions stated above, I reevaluated the patient and found *** Admission and further testing considered, ***   {Document critical care time when appropriate:1} {Document review of labs and clinical decision tools ie heart score, Chads2Vasc2 etc:1}  {Document your independent review of radiology images, and any outside records:1} {Document your discussion with family members, caretakers, and with consultants:1} {Document social determinants of health affecting pt's care:1} {Document your decision making why or why not admission, treatments were needed:1} Final Clinical  Impression(s) / ED Diagnoses Final diagnoses:  None    Rx / DC Orders ED Discharge Orders     None

## 2022-08-26 ENCOUNTER — Encounter: Payer: Self-pay | Admitting: Cardiology

## 2022-08-31 ENCOUNTER — Other Ambulatory Visit: Payer: Self-pay | Admitting: Family Medicine

## 2022-09-08 ENCOUNTER — Encounter: Payer: Self-pay | Admitting: Cardiology

## 2022-09-08 ENCOUNTER — Ambulatory Visit: Payer: Medicare Other | Attending: Cardiology | Admitting: Cardiology

## 2022-09-08 VITALS — BP 104/68 | HR 111 | Ht 71.0 in | Wt 206.0 lb

## 2022-09-08 DIAGNOSIS — R0609 Other forms of dyspnea: Secondary | ICD-10-CM | POA: Diagnosis not present

## 2022-09-08 DIAGNOSIS — I480 Paroxysmal atrial fibrillation: Secondary | ICD-10-CM

## 2022-09-08 DIAGNOSIS — E782 Mixed hyperlipidemia: Secondary | ICD-10-CM

## 2022-09-08 DIAGNOSIS — I10 Essential (primary) hypertension: Secondary | ICD-10-CM | POA: Diagnosis not present

## 2022-09-08 NOTE — Patient Instructions (Signed)
Medication Instructions:  STOP: Hydrochlorothiazide   Lab Work: None Ordered If you have labs (blood work) drawn today and your tests are completely normal, you will receive your results only by: [place] (if you have MyChart) OR A paper copy in the mail If you have any lab test that is abnormal or we need to change your treatment, we will call you to review the results.   Testing/Procedures: Your physician has requested that you have an echocardiogram. Echocardiography is a painless test that uses sound waves to create images of your heart. It provides your doctor with information about the size and shape of your heart and how well your heart's chambers and valves are working. This procedure takes approximately one hour. There are no restrictions for this procedure. Please do NOT wear cologne, perfume, aftershave, or lotions (deodorant is allowed). Please arrive 15 minutes prior to your appointment time.  Your physician has requested that you have a lexiscan myoview. For further information please visit [email or web address]. Please follow instruction sheet, as given.  The test will take approximately 3 to 4 hours to complete; you may bring reading material.  If someone comes with you to your appointment, they will need to remain in the main lobby due to limited space in the testing area.  How to prepare for your Myocardial Perfusion Test: Do not eat or drink 3 hours prior to your test, except you may have water. Do not consume products containing caffeine (regular or decaffeinated) 12 hours prior to your test. (ex: coffee, chocolate, sodas, tea). Do bring a list of your current medications with you.  If not listed below, you may take your medications as normal. Do wear comfortable clothes (no dresses or overalls) and walking shoes, tennis shoes preferred (No heels or open toe shoes are allowed). Do NOT wear cologne, perfume, aftershave, or lotions (deodorant is allowed). If these  instructions are not followed, your test will have to be rescheduled.   ]   Follow-Up: At [facility], you and your health needs are our priority.  As part of our continuing mission to provide you with exceptional heart care, we have created designated Provider Care Teams.  These Care Teams include your primary Cardiologist (physician) and Advanced Practice Providers (APPs -  Physician Assistants and Nurse Practitioners) who all work together to provide you with the care you need, when you need it.  We recommend signing up for the patient portal called "MyChart".  Sign up information is provided on this After Visit Summary.  MyChart is used to connect with patients for Virtual Visits (Telemedicine).  Patients are able to view lab/test results, encounter notes, upcoming appointments, etc.  Non-urgent messages can be sent to your provider as well.   To learn more about what you can do with MyChart, go to [email or web address].    Your next appointment:   2 month(s)  The format for your next appointment:   In Person  Provider:   Jenne Campus, MD    Other Instructions NA

## 2022-09-08 NOTE — Progress Notes (Signed)
Cardiology Office Note:    Date:  09/08/2022   ID:  Darius Mitchell, DOB [birth year], MRN [id number]  PCP:  Darius Pal, DO  Cardiologist:  Jenne Campus, MD    Referring MD: Darius Mitchell*   No chief complaint on file. I am doing fine  History of Present Illness:    Darius Mitchell is a 72 y.o. male past medical history significant for paroxysmal atrial fibrillation, he is anticoagulated, essential hypertension, dyslipidemia.  Last time he was in my office in February and his EKG was done he was in atrial fibrillation.  I brought him back this month to see if he is still in atrial fibrillation, however, in the meantime he was checking his EKG with his Apple Watch and there were a few episodes that he was normal sinus rhythm, it looks like we do have paroxysmal atrial fibrillation with frequent switches.  He is completely unaware of his atrial fibrillation actually jokingly tells me today he is probably too active for his age.  He working the garden all day have no difficulty doing it denies have any shortness of breath chest pain palpitation dizziness swelling of lower extremities  Past Medical History:  Diagnosis Date   Anxiety and depression 03/25/2014   BMI 30.0-30.9,adult 05/15/2015   Carpal tunnel syndrome of right wrist 10/23/2015   Diverticulosis of colon without hemorrhage 03/17/2016   Essential hypertension, benign 03/25/2014   GERD (gastroesophageal reflux disease)    Hyperlipidemia    Hypothyroidism    Low testosterone 05/15/2015   Paroxysmal atrial fibrillation ([place]) 12/03/2018   Prostate cancer screening encounter, options and risks discussed 04/30/2014   Screening for ischemic heart disease 04/30/2014   Swelling of both lower extremities 11/29/2017   Visit for preventive health examination 04/30/2014    Past Surgical History:  Procedure Laterality Date   APPENDECTOMY  1971   WISDOM TOOTH EXTRACTION  1972    Current Medications: Current Meds   Medication Sig   apixaban (ELIQUIS) 5 MG TABS tablet Take 1 tablet by mouth 2 (two) times daily. (Patient taking differently: Take 5 mg by mouth 2 (two) times daily.)   atorvastatin (LIPITOR) 80 MG tablet TAKE 1 TABLET BY MOUTH EVERY DAY (Patient taking differently: Take 80 mg by mouth daily.)   ezetimibe (ZETIA) 10 MG tablet TAKE 1 TABLET BY MOUTH EVERY DAY (Patient taking differently: Take 10 mg by mouth daily.)   Glucosamine-Chondroit-Biofl-Mn (GLUCOSAMINE CHONDROIT,BIOFLAV,) CAPS Take 1 tablet by mouth daily.   hydrochlorothiazide (HYDRODIURIL) 25 MG tablet Take 25 mg by mouth daily.   levothyroxine (SYNTHROID) 125 MCG tablet TAKE 1 TABLET BY MOUTH ONCE DAILY BEFORE BREAKFAST   losartan (COZAAR) 100 MG tablet TAKE 1 TABLET BY MOUTH EVERY DAY (Patient taking differently: Take 100 mg by mouth daily.)   Multiple Vitamins-Minerals (CENTRUM SILVER ULTRA MENS) TABS Take 1 tablet by mouth daily. Unknown strength   omeprazole (PRILOSEC) 40 MG capsule TAKE 1 CAPSULE BY MOUTH EVERY DAY (Patient taking differently: Take 40 mg by mouth daily.)   PARoxetine (PAXIL) 30 MG tablet TAKE 1 TABLET BY MOUTH EVERY DAY (Patient taking differently: Take 30 mg by mouth daily.)     Allergies:   Codeine, Niacin, and Niacin and related   Social History   Socioeconomic History   Marital status: Married    Spouse name: Not on file   Number of children: Not on file   Years of education: Not on file   Highest education level: Not on file  Occupational History   Not on file  Tobacco Use   Smoking status: Former    Packs/day: 0.25    Years: 25.00    Additional pack years: 0.00    Total pack years: 6.25    Types: Cigarettes    Quit date: 05/28/2019    Years since quitting: 3.2   Smokeless tobacco: Never  Vaping Use   Vaping Use: Never used  Substance and Sexual Activity   Alcohol use: Never   Drug use: Never   Sexual activity: Not on file  Other Topics Concern   Not on file  Social History Narrative    Not on file   Social Determinants of Health   Financial Resource Strain: Not on file  Food Insecurity: Not on file  Transportation Needs: Not on file  Physical Activity: Not on file  Stress: Not on file  Social Connections: Not on file     Family History: The patient's family history includes Healthy in his brother, daughter, and sister; Heart attack in his mother; Heart disease in his father; Hypertension in his daughter; Lymphoma (age of onset: 43) in his father; Stomach cancer in his paternal grandfather; Stroke in his maternal grandmother; Stroke (age of onset: 2) in his mother. ROS:   Please see the history of present illness.    All 14 point review of systems negative except as described per history of present illness  EKGs/Labs/Other Studies Reviewed:      Recent Labs: 07/19/2022: ALT 20; BUN 14; Creatinine, Ser 0.97; Hemoglobin 13.6; Platelets 138.0; Potassium 4.0; Sodium 135; TSH 2.42  Recent Lipid Panel    Component Value Date/Time   CHOL 119 07/19/2022 1318   TRIG 83.0 07/19/2022 1318   HDL 44.40 07/19/2022 1318   CHOLHDL 3 07/19/2022 1318   VLDL 16.6 07/19/2022 1318   LDLCALC 58 07/19/2022 1318   LDLCALC 112 (H) 03/31/2020 0837   LDLDIRECT 94.0 10/24/2019 0733    Physical Exam:    VS:  BP 104/68 (BP Location: Right Arm, Patient Position: Sitting)   Pulse (!) 111   Ht '5\' 11"'$  (1.803 m)   Wt 206 lb (93.4 kg)   SpO2 99%   BMI 28.73 kg/m     Wt Readings from Last 3 Encounters:  09/08/22 206 lb (93.4 kg)  08/23/22 192 lb (87.1 kg)  08/03/22 204 lb (92.5 kg)     GEN:  Well nourished, well developed in no acute distress HEENT: Normal NECK: No JVD; No carotid bruits LYMPHATICS: No lymphadenopathy CARDIAC: Irregularly irregular, no murmurs, no rubs, no gallops RESPIRATORY:  Clear to auscultation without rales, wheezing or rhonchi  ABDOMEN: Soft, non-tender, non-distended MUSCULOSKELETAL:  No edema; No deformity  SKIN: Warm and dry LOWER EXTREMITIES: no  swelling NEUROLOGIC:  Alert and oriented x 3 PSYCHIATRIC:  Normal affect   ASSESSMENT:    1. Paroxysmal atrial fibrillation (HCC)   2. Essential hypertension, benign   3. Mixed hyperlipidemia    PLAN:    In order of problems listed above:  Paroxysmal atrial fibrillation.  He is anticoagulated.  I will schedule him to have echocardiogram diagram to assess left ventricle ejection fraction, stress test also will be done to rule out ischemia this is in preparation for potentially using flecainide.  Will continue Eliquis on the regular basis.  I will bring him back to my office in about 2 months hopefully at that time he will be already on flecainide for about a month and based on that we will determine if  we need to do cardioversion.  I suspect he will be much more stable with flecainide. Essential hypertension blood pressure low I will ask him to stop hydrochlorothiazide. Mixed dyslipidemia I did review K PN data from July 19, 2022 LDL 58 HDL 44 continue present management   Medication Adjustments/Labs and Tests Ordered: Current medicines are reviewed at length with the patient today.  Concerns regarding medicines are outlined above.  No orders of the defined types were placed in this encounter.  Medication changes: No orders of the defined types were placed in this encounter.   Signed, Park Liter, MD, [facility] 09/08/2022 3:33 PM    [place]

## 2022-09-12 ENCOUNTER — Emergency Department (HOSPITAL_BASED_OUTPATIENT_CLINIC_OR_DEPARTMENT_OTHER)
Admission: EM | Admit: 2022-09-12 | Discharge: 2022-09-12 | Disposition: A | Discharge status: Home / Self Care | Payer: Medicare Other | Attending: Emergency Medicine | Admitting: Emergency Medicine

## 2022-09-12 ENCOUNTER — Other Ambulatory Visit (HOSPITAL_BASED_OUTPATIENT_CLINIC_OR_DEPARTMENT_OTHER): Payer: Self-pay

## 2022-09-12 ENCOUNTER — Encounter (HOSPITAL_BASED_OUTPATIENT_CLINIC_OR_DEPARTMENT_OTHER): Payer: Self-pay

## 2022-09-12 ENCOUNTER — Telehealth: Payer: Self-pay | Admitting: Cardiology

## 2022-09-12 ENCOUNTER — Emergency Department (HOSPITAL_BASED_OUTPATIENT_CLINIC_OR_DEPARTMENT_OTHER): Payer: Medicare Other

## 2022-09-12 DIAGNOSIS — I4891 Unspecified atrial fibrillation: Secondary | ICD-10-CM

## 2022-09-12 DIAGNOSIS — R002 Palpitations: Secondary | ICD-10-CM | POA: Diagnosis present

## 2022-09-12 DIAGNOSIS — Z7901 Long term (current) use of anticoagulants: Secondary | ICD-10-CM | POA: Insufficient documentation

## 2022-09-12 DIAGNOSIS — Z87891 Personal history of nicotine dependence: Secondary | ICD-10-CM | POA: Insufficient documentation

## 2022-09-12 DIAGNOSIS — R079 Chest pain, unspecified: Secondary | ICD-10-CM | POA: Diagnosis not present

## 2022-09-12 DIAGNOSIS — J90 Pleural effusion, not elsewhere classified: Secondary | ICD-10-CM | POA: Diagnosis not present

## 2022-09-12 HISTORY — DX: Unspecified atrial fibrillation: I48.91

## 2022-09-12 LAB — CBC
HCT: 41.3 % (ref 39.0–52.0)
Hemoglobin: 13.6 g/dL (ref 13.0–17.0)
MCH: 31.4 pg (ref 26.0–34.0)
MCHC: 32.9 g/dL (ref 30.0–36.0)
MCV: 95.4 fL (ref 80.0–100.0)
Platelets: 126 10*3/uL — ABNORMAL LOW (ref 150–400)
RBC: 4.33 MIL/uL (ref 4.22–5.81)
RDW: 12.8 % (ref 11.5–15.5)
WBC: 7.5 10*3/uL (ref 4.0–10.5)
nRBC: 0 % (ref 0.0–0.2)

## 2022-09-12 LAB — BASIC METABOLIC PANEL
Anion gap: 3 — ABNORMAL LOW (ref 5–15)
BUN: 19 mg/dL (ref 8–23)
CO2: 26 mmol/L (ref 22–32)
Calcium: 8.9 mg/dL (ref 8.9–10.3)
Chloride: 104 mmol/L (ref 98–111)
Creatinine, Ser: 1.12 mg/dL (ref 0.61–1.24)
GFR, Estimated: >60 mL/min (ref >60)
Glucose, Bld: 128 mg/dL — ABNORMAL HIGH (ref 70–99)
Potassium: 3.8 mmol/L (ref 3.5–5.1)
Sodium: 133 mmol/L — ABNORMAL LOW (ref 135–145)

## 2022-09-12 LAB — TROPONIN I (HIGH SENSITIVITY): Troponin I (High Sensitivity): 12 ng/L (ref <18)

## 2022-09-12 LAB — MAGNESIUM: Magnesium: 2.1 mg/dL (ref 1.7–2.4)

## 2022-09-12 MED ORDER — SODIUM CHLORIDE 0.9 % IV BOLUS
1000.0000 mL | Freq: Once | INTRAVENOUS | Status: AC
  Administered 2022-09-12: 1000 mL via INTRAVENOUS

## 2022-09-12 MED ORDER — DILTIAZEM HCL 25 MG/5ML IV SOLN
15.0000 mg | Freq: Once | INTRAVENOUS | Status: AC
  Administered 2022-09-12: 15 mg via INTRAVENOUS
  Filled 2022-09-12: qty 5

## 2022-09-12 MED ORDER — DILTIAZEM HCL 30 MG PO TABS
30.0000 mg | ORAL_TABLET | Freq: Once | ORAL | Status: AC
  Administered 2022-09-12: 30 mg via ORAL
  Filled 2022-09-12: qty 1

## 2022-09-12 MED ORDER — DILTIAZEM HCL 30 MG PO TABS
30.0000 mg | ORAL_TABLET | Freq: Four times a day (QID) | ORAL | 0 refills | Status: DC
  Filled 2022-09-12: qty 101, 25d supply, fill #0
  Filled 2022-09-12: qty 19, 5d supply, fill #0

## 2022-09-12 NOTE — ED Provider Notes (Signed)
[facility] Provider Note   CSN: [id number] Arrival date & time: 09/12/22  [id number]     History  Chief Complaint  Patient presents with   Atrial Fibrillation    Darius Mitchell is a 72 y.o. male with a history of paroxysmal A-fib on Eliquis presenting to the ED with palpitations and feeling unwell.  Patient reports that he woke up this morning feeling generally unwell, he thought that he had indigestion as he ate a hotdog and hamburger last night, took some Tums with no relief.  He noted his heart rate was irregular and fast on his Apple Watch, but thought nothing of it until his wife advised that he come to the ED.  He says he goes in and out of A-fib all the time.  He says his typical resting heart rates around 50 bpm.  He saw his cardiologist earlier this week and is pending an echocardiogram prior to being initiated on flecainide.  His cardiologist took him off of HCTZ and amlodipine because the patient's blood pressures were "running too low".  Patient reports his systolic pressure can anywhere from 90-120 mmhg on any given day.  He denies history of coronary disease or MI.  He reports that he is a former longtime smoker but quit 1 week ago.  He is compliant with his medications including his statin medicine and his Eliquis.  HPI     Home Medications Prior to Admission medications   Medication Sig Start Date End Date Taking? Authorizing Provider  diltiazem (CARDIZEM) 30 MG tablet Take 1 tablet (30 mg total) by mouth 4 (four) times daily. For heart rate above 100 beats per minute in AFib. 09/12/22 10/12/22 Yes Waylyn Tenbrink, Carola Rhine, MD  apixaban (ELIQUIS) 5 MG TABS tablet Take 1 tablet by mouth 2 (two) times daily. Patient taking differently: Take 5 mg by mouth 2 (two) times daily. 12/22/21 12/22/22  Park Liter, MD  atorvastatin (LIPITOR) 80 MG tablet TAKE 1 TABLET BY MOUTH EVERY DAY Patient taking differently: Take 80 mg by mouth daily.  09/23/21   [name], DO  ezetimibe (ZETIA) 10 MG tablet TAKE 1 TABLET BY MOUTH EVERY DAY Patient taking differently: Take 10 mg by mouth daily. 09/23/21   Wendling, Crosby Oyster, DO  Glucosamine-Chondroit-Biofl-Mn (GLUCOSAMINE CHONDROIT,BIOFLAV,) CAPS Take 1 tablet by mouth daily.    [provider]  levothyroxine (SYNTHROID) 125 MCG tablet TAKE 1 TABLET BY MOUTH ONCE DAILY BEFORE BREAKFAST 08/31/22 08/31/23  [name], DO  losartan (COZAAR) 100 MG tablet TAKE 1 TABLET BY MOUTH EVERY DAY Patient taking differently: Take 100 mg by mouth daily. 09/23/21   [name], DO  Multiple Vitamins-Minerals (CENTRUM SILVER ULTRA MENS) TABS Take 1 tablet by mouth daily. Unknown strength    [provider]  omeprazole (PRILOSEC) 40 MG capsule TAKE 1 CAPSULE BY MOUTH EVERY DAY Patient taking differently: Take 40 mg by mouth daily. 09/23/21   [name], DO  PARoxetine (PAXIL) 30 MG tablet TAKE 1 TABLET BY MOUTH EVERY DAY Patient taking differently: Take 30 mg by mouth daily. 09/23/21   [name], DO  metoprolol succinate (TOPROL-XL) 25 MG 24 hr tablet TAKE 1 TABLET BY MOUTH ONCE DAILY 12/18/19 05/18/20  Park Liter, MD      Allergies    Codeine, Niacin, and Niacin and related    Review of Systems   Review of Systems  Physical Exam Updated Vital Signs BP 127/83  Pulse 82   Temp 97.7 F (36.5 C) (Oral)   Resp (!) 23   Ht 5\' 11"  (1.803 m)   Wt 86.2 kg   SpO2 98%   BMI 26.50 kg/m  Physical Exam Constitutional:      General: He is not in acute distress. HENT:     Head: Normocephalic and atraumatic.  Eyes:     Conjunctiva/sclera: Conjunctivae normal.     Pupils: Pupils are equal, round, and reactive to light.  Cardiovascular:     Rate and Rhythm: Tachycardia present. Rhythm irregular.  Pulmonary:     Effort: Pulmonary effort is normal. No respiratory distress.  Abdominal:     General: There is no  distension.     Tenderness: There is no abdominal tenderness.  Skin:    General: Skin is warm and dry.  Neurological:     General: No focal deficit present.     Mental Status: He is alert. Mental status is at baseline.  Psychiatric:        Mood and Affect: Mood normal.        Behavior: Behavior normal.     ED Results / Procedures / Treatments   Labs (all labs ordered are listed, but only abnormal results are displayed) Labs Reviewed  BASIC METABOLIC PANEL - Abnormal; Notable for the following components:      Result Value   Sodium 133 (*)    Glucose, Bld 128 (*)    Anion gap 3 (*)    All other components within normal limits  CBC - Abnormal; Notable for the following components:   Platelets 126 (*)    All other components within normal limits  MAGNESIUM  TROPONIN I (HIGH SENSITIVITY)  TROPONIN I (HIGH SENSITIVITY)    EKG EKG Interpretation  Date/Time:  Monday September 12 2022 09:06:12 EDT Ventricular Rate:  130 PR Interval:    QRS Duration: 98 QT Interval:  325 QTC Calculation: 478 R Axis:   59 Text Interpretation: Atrial fibrillation Borderline prolonged QT interval Confirmed by Octaviano Glow [id number]) on 09/12/2022 9:07:17 AM  Radiology DG Chest Portable 1 View  Result Date: 09/12/2022 CLINICAL DATA:  Left chest pain EXAM: PORTABLE CHEST 1 VIEW COMPARISON:  11/23/2018 FINDINGS: Transverse diameter of heart is increased. Increased interstitial markings are seen in parahilar regions and lower lung fields. There is no focal pulmonary consolidation. There is minimal blunting of lateral CP angles. There is no pneumothorax. IMPRESSION: Cardiomegaly. There is interval increase in interstitial markings in parahilar regions and lower lung fields on both sides suggesting interstitial pulmonary edema. Less likely possibility would be interstitial pneumonia. Small bilateral pleural effusions are seen. Electronically Signed   By: Elmer Picker M.D.   On: 09/12/2022 09:54     Procedures .Critical Care  Performed by: Wyvonnia Dusky, MD Authorized by: Wyvonnia Dusky, MD   Critical care provider statement:    Critical care time (minutes):  45   Critical care time was exclusive of:  Separately billable procedures and treating other patients   Critical care was necessary to treat or prevent imminent or life-threatening deterioration of the following conditions:  Circulatory failure   Critical care was time spent personally by me on the following activities:  Ordering and performing treatments and interventions, ordering and review of laboratory studies, ordering and review of radiographic studies, pulse oximetry, review of old charts, examination of patient and evaluation of patient's response to treatment Comments:     A-fib with RVR heart rate control  Medications Ordered in ED Medications  sodium chloride 0.9 % bolus 1,000 mL (0 mLs Intravenous Stopped 09/12/22 1050)  diltiazem (CARDIZEM) injection 15 mg (15 mg Intravenous Given 09/12/22 0932)  diltiazem (CARDIZEM) tablet 30 mg (30 mg Oral Given 09/12/22 1014)    ED Course/ Medical Decision Making/ A&P Clinical Course as of 09/12/22 1328  Mon Sep 12, 2022  0921 BP 144/61mmhg now [MT]  1008 HR improved and patient feeling better, still in A Fib but no longer RVR [MT]  1008 I discussed with him the options now of starting him on short acting diltiazem which she can use as needed at home until he sees his cardiologist, versus attempted cardioversion into sinus rhythm, with the understanding of both the risks of cardioversion and anesthesia and sedation, as well as the risk that he may revert back into A-fib at a later time.  He and his wife have elected to try the oral heart rate control with diltiazem at home.  I think this is a reasonable plan. [MT]    Clinical Course User Index [MT] Saadiq Poche, Carola Rhine, MD                             Medical Decision Making Amount and/or Complexity of Data  Reviewed Labs: ordered. Radiology: ordered.  Risk Prescription drug management.   This patient presents to the ED with concern for palpitations, chest discomfort. This involves an extensive number of treatment options, and is a complaint that carries with it a high risk of complications and morbidity.  The differential diagnosis includes A-fib with RVR versus pneumonia versus atypical ACS versus reflux versus other  Co-morbidities that complicate the patient evaluation: History of paroxysmal A-fib at risk of recurring complication from arrhythmia  External records from outside source obtained and reviewed including cardiology office evaluation for earlier this week noting that the patient would be initiated on flecainide moving forward, after his echocardiogram  I ordered and personally interpreted labs.  The pertinent results include: No emergent findings  I ordered imaging studies including x-ray of the chest I independently visualized and interpreted imaging which showed increased interstitial markings, nonspecific I agree with the radiologist interpretation  The patient was maintained on a cardiac monitor.  I personally viewed and interpreted the cardiac monitored which showed an underlying rhythm of: A-fib with RVR, heart rate 110-140 bpm  Per my interpretation the patient's ECG shows A-fib with RVR with no acute ischemic findings  I ordered medication including diltiazem for heart rate control  I have reviewed the patients home medicines and have made adjustments as needed  Test Considered: Low suspicion for acute PE clinically.  Denies indication for CT angiogram  After the interventions noted above, I reevaluated the patient and found that they have: improved   Dispostion:  After consideration of the diagnostic results and the patients response to treatment, I feel that the patent would benefit from close cardiology follow-up..        Final Clinical Impression(s) /  ED Diagnoses Final diagnoses:  Atrial fibrillation with RVR ([place])    Rx / DC Orders ED Discharge Orders          Ordered    diltiazem (CARDIZEM) 30 MG tablet  4 times daily        09/12/22 1101              Wyvonnia Dusky, MD 09/12/22 1328

## 2022-09-12 NOTE — ED Notes (Signed)
Pt verbally understood discharge instructions, signature pad not working.

## 2022-09-12 NOTE — ED Triage Notes (Signed)
Pt states that his watch keeps alerting him that his blood pressure is all over the place and his pulse is between 58 and 120. Pt denies chest pain, SOB and difficulty breathing.

## 2022-09-12 NOTE — Discharge Instructions (Addendum)
I prescribed you diltiazem which is a blood pressure and heart rate controlling medication which he will take for tachycardia or elevated heart rate.  If you are in A-fib with irregular heartbeat and your heart rate is over 100 beats per minute, for more than 10 minutes, you can take one of these diltiazem tablets as needed at home.  You can take diltiazem once every 4-6 hours at home.    If you have persistently elevated heart rate, you are feeling lightheaded, nauseous, chest discomfort, or any other emergency concerns, please return to the emergency department.  Please also reach out to your heart doctors office to schedule a follow-up

## 2022-09-12 NOTE — Telephone Encounter (Signed)
Pt called in wanting to make Dr. Raliegh Ip aware hat he is on the way to [facility] ER with afib again.

## 2022-09-13 ENCOUNTER — Other Ambulatory Visit: Payer: Self-pay | Admitting: Family Medicine

## 2022-09-13 DIAGNOSIS — F419 Anxiety disorder, unspecified: Secondary | ICD-10-CM

## 2022-09-15 ENCOUNTER — Encounter: Payer: Self-pay | Admitting: Cardiology

## 2022-09-20 ENCOUNTER — Encounter: Payer: Self-pay | Admitting: Cardiology

## 2022-09-20 ENCOUNTER — Ambulatory Visit: Payer: Medicare Other | Attending: Cardiology

## 2022-09-20 DIAGNOSIS — R0609 Other forms of dyspnea: Secondary | ICD-10-CM | POA: Diagnosis not present

## 2022-09-20 MED ORDER — REGADENOSON 0.4 MG/5ML IV SOLN
0.4000 mg | Freq: Once | INTRAVENOUS | Status: AC
  Administered 2022-09-20: 0.4 mg via INTRAVENOUS

## 2022-09-20 MED ORDER — TECHNETIUM TC 99M TETROFOSMIN IV KIT
31.3000 | PACK | Freq: Once | INTRAVENOUS | Status: AC | PRN
  Administered 2022-09-20: 31.3 via INTRAVENOUS

## 2022-09-20 MED ORDER — TECHNETIUM TC 99M TETROFOSMIN IV KIT
10.7000 | PACK | Freq: Once | INTRAVENOUS | Status: AC | PRN
  Administered 2022-09-20: 10.7 via INTRAVENOUS

## 2022-09-22 ENCOUNTER — Ambulatory Visit: Payer: Medicare Other | Attending: Cardiology | Admitting: Cardiology

## 2022-09-22 ENCOUNTER — Other Ambulatory Visit (HOSPITAL_BASED_OUTPATIENT_CLINIC_OR_DEPARTMENT_OTHER): Payer: Self-pay

## 2022-09-22 VITALS — BP 130/82 | HR 88 | Ht 71.0 in | Wt 205.0 lb

## 2022-09-22 DIAGNOSIS — I480 Paroxysmal atrial fibrillation: Secondary | ICD-10-CM | POA: Diagnosis not present

## 2022-09-22 DIAGNOSIS — I10 Essential (primary) hypertension: Secondary | ICD-10-CM

## 2022-09-22 DIAGNOSIS — R0609 Other forms of dyspnea: Secondary | ICD-10-CM | POA: Diagnosis not present

## 2022-09-22 DIAGNOSIS — I420 Dilated cardiomyopathy: Secondary | ICD-10-CM

## 2022-09-22 DIAGNOSIS — E782 Mixed hyperlipidemia: Secondary | ICD-10-CM | POA: Diagnosis not present

## 2022-09-22 LAB — MYOCARDIAL PERFUSION IMAGING
LV dias vol: 129 mL (ref 62–150)
LV sys vol: 89 mL
Nuc Stress EF: 31 %
Peak HR: 155 {beats}/min
Rest HR: 144 {beats}/min
Rest Nuclear Isotope Dose: 10.7 mCi
SDS: 0
SRS: 4
SSS: 4
Stress Nuclear Isotope Dose: 31.3 mCi
TID: 1.04

## 2022-09-22 MED ORDER — FUROSEMIDE 20 MG PO TABS
20.0000 mg | ORAL_TABLET | Freq: Every day | ORAL | 3 refills | Status: DC
  Filled 2022-09-22: qty 90, 90d supply, fill #0
  Filled 2022-11-02 – 2022-11-22 (×3): qty 90, 90d supply, fill #1

## 2022-09-22 MED ORDER — ENTRESTO 24-26 MG PO TABS
1.0000 | ORAL_TABLET | Freq: Two times a day (BID) | ORAL | 6 refills | Status: DC
  Filled 2022-09-22: qty 60, 30d supply, fill #0
  Filled 2022-10-24: qty 60, 30d supply, fill #1
  Filled 2022-11-02 – 2022-11-18 (×2): qty 60, 30d supply, fill #2
  Filled 2022-12-18: qty 60, 30d supply, fill #3
  Filled 2023-01-22: qty 60, 30d supply, fill #4
  Filled 2023-01-29: qty 60, 30d supply, fill #5

## 2022-09-22 MED ORDER — POTASSIUM CHLORIDE ER 10 MEQ PO TBCR
10.0000 meq | EXTENDED_RELEASE_TABLET | Freq: Every day | ORAL | 3 refills | Status: DC
  Filled 2022-09-22: qty 90, 90d supply, fill #0

## 2022-09-22 NOTE — Progress Notes (Signed)
Cardiology Office Note:    Date:  09/22/2022   ID:  Darius Mitchell, DOB 14-Dec-1950, MRN [id number]  PCP:  [name], DO  Cardiologist:  Jenne Campus, MD    Referring MD: [name]*   Chief Complaint  Patient presents with   Follow-up  Abnormal stress to showing diminished ejection fraction  History of Present Illness:    Darius Mitchell is a 72 y.o. male past medical history significant for paroxysmal atrial fibrillation he is anticoagulated, essential hypertension, dyslipidemia last time I seen him he started having episode of paroxysmal atrial fibrillation we decided to start antiarrhythmic therapy flecainide was the choice however we did not start it first we tried to evaluate for potential problem so he did have stress test done which showed surprisingly diminished ejection fraction neighborhood of 35%, no ischemia, he was recently in the emergency room he was found to have some congestion as well.  Last time I seen him I stopped his hydrochlorothiazide as well as amlodipine because of low blood pressure.  He is scheduled to have echocardiogram next week anxiously waiting to have the results.  Obviously is very stressed out about this.  He comes to my office today with his wife who participated in the discussion and decision making.  Denies have any chest pain tightness squeezing pressure burning chest but described the fact that he is short of breath and fatigue much more than usually but at the same time yesterday he spent half of the day cutting grass outside so overall I think he is still doing fine  Past Medical History:  Diagnosis Date   Afib ([place])    Anxiety and depression 03/25/2014   BMI 30.0-30.9,adult 05/15/2015   Carpal tunnel syndrome of right wrist 10/23/2015   Diverticulosis of colon without hemorrhage 03/17/2016   Essential hypertension, benign 03/25/2014   GERD (gastroesophageal reflux disease)    Hyperlipidemia    Hypothyroidism     Low testosterone 05/15/2015   Paroxysmal atrial fibrillation ([place]) 12/03/2018   Prostate cancer screening encounter, options and risks discussed 04/30/2014   Screening for ischemic heart disease 04/30/2014   Swelling of both lower extremities 11/29/2017   Visit for preventive health examination 04/30/2014    Past Surgical History:  Procedure Laterality Date   APPENDECTOMY  1971   WISDOM TOOTH EXTRACTION  1972    Current Medications: Current Meds  Medication Sig   apixaban (ELIQUIS) 5 MG TABS tablet Take 1 tablet by mouth 2 (two) times daily. (Patient taking differently: Take 5 mg by mouth 2 (two) times daily.)   atorvastatin (LIPITOR) 80 MG tablet TAKE 1 TABLET BY MOUTH EVERY DAY   diltiazem (CARDIZEM) 30 MG tablet Take 1 tablet (30 mg total) by mouth 4 (four) times daily. For heart rate above 100 beats per minute in AFib.   ezetimibe (ZETIA) 10 MG tablet TAKE 1 TABLET BY MOUTH EVERY DAY   Glucosamine-Chondroit-Biofl-Mn (GLUCOSAMINE CHONDROIT,BIOFLAV,) CAPS Take 1 tablet by mouth daily.   hydrochlorothiazide (HYDRODIURIL) 25 MG tablet TAKE 1 TABLET (25 MG TOTAL) BY MOUTH DAILY.   levothyroxine (SYNTHROID) 125 MCG tablet TAKE 1 TABLET BY MOUTH ONCE DAILY BEFORE BREAKFAST   losartan (COZAAR) 100 MG tablet TAKE 1 TABLET BY MOUTH EVERY DAY   Multiple Vitamins-Minerals (CENTRUM SILVER ULTRA MENS) TABS Take 1 tablet by mouth daily. Unknown strength   omeprazole (PRILOSEC) 40 MG capsule TAKE 1 CAPSULE BY MOUTH EVERY DAY   PARoxetine (PAXIL) 30 MG tablet TAKE 1 TABLET BY MOUTH  EVERY DAY     Allergies:   Codeine, Niacin, and Niacin and related   Social History   Socioeconomic History   Marital status: Married    Spouse name: Not on file   Number of children: Not on file   Years of education: Not on file   Highest education level: Not on file  Occupational History   Not on file  Tobacco Use   Smoking status: Former    Packs/day: 0.25    Years: 25.00    Additional pack years: 0.00     Total pack years: 6.25    Types: Cigarettes    Quit date: 05/28/2019    Years since quitting: 3.3   Smokeless tobacco: Never  Vaping Use   Vaping Use: Never used  Substance and Sexual Activity   Alcohol use: Never   Drug use: Never   Sexual activity: Not on file  Other Topics Concern   Not on file  Social History Narrative   Not on file   Social Determinants of Health   Financial Resource Strain: Not on file  Food Insecurity: Not on file  Transportation Needs: Not on file  Physical Activity: Not on file  Stress: Not on file  Social Connections: Not on file     Family History: The patient's family history includes Healthy in his brother, daughter, and sister; Heart attack in his mother; Heart disease in his father; Hypertension in his daughter; Lymphoma (age of onset: 64) in his father; Stomach cancer in his paternal grandfather; Stroke in his maternal grandmother; Stroke (age of onset: 32) in his mother. ROS:   Please see the history of present illness.    All 14 point review of systems negative except as described per history of present illness  EKGs/Labs/Other Studies Reviewed:      Recent Labs: 07/19/2022: ALT 20; TSH 2.42 09/12/2022: BUN 19; Creatinine, Ser 1.12; Hemoglobin 13.6; Magnesium 2.1; Platelets 126; Potassium 3.8; Sodium 133  Recent Lipid Panel    Component Value Date/Time   CHOL 119 07/19/2022 1318   TRIG 83.0 07/19/2022 1318   HDL 44.40 07/19/2022 1318   CHOLHDL 3 07/19/2022 1318   VLDL 16.6 07/19/2022 1318   LDLCALC 58 07/19/2022 1318   LDLCALC 112 (H) 03/31/2020 0837   LDLDIRECT 94.0 10/24/2019 0733    Physical Exam:    VS:  BP 130/82 (BP Location: Right Arm, Patient Position: Sitting)   Pulse 88   Ht 5\' 11"  (1.803 m)   Wt 205 lb (93 kg)   BMI 28.59 kg/m     Wt Readings from Last 3 Encounters:  09/22/22 205 lb (93 kg)  09/20/22 190 lb (86.2 kg)  09/12/22 190 lb (86.2 kg)     GEN:  Well nourished, well developed in no acute  distress HEENT: Normal NECK: No JVD; No carotid bruits LYMPHATICS: No lymphadenopathy CARDIAC irregular irregular, no murmurs, no rubs, no gallops RESPIRATORY:  Clear to auscultation without rales, wheezing or rhonchi  ABDOMEN: Soft, non-tender, non-distended MUSCULOSKELETAL:  No edema; No deformity  SKIN: Warm and dry LOWER EXTREMITIES: no swelling NEUROLOGIC:  Alert and oriented x 3 PSYCHIATRIC:  Normal affect   ASSESSMENT:    1. Dilated cardiomyopathy ([place])   2. Essential hypertension, benign   3. Paroxysmal atrial fibrillation ([facility])   4. Mixed hyperlipidemia    PLAN:    In order of problems listed above:  Dilated cardiomyopathy based on stress test it is 30 to 35%.  Obviously very concerning.  I will  stop his losartan I will put him on Entresto 24-26 twice daily, I will add Lasix 20 mg daily to medical regimen +10 mg of potassium.  Will do Chem-7 and proBNP today, wait for results of his echocardiogram if echocardiogram to reshowed diminished ejection fraction.  Will augment his medical therapy for cardiomyopathy will also initiate amiodarone therapy with intention to cardiovert him electrically if he does not convert himself. Mixed dyslipidemia I did review K PN which show me his LDL 58 HDL 44.  Will continue present management. Essential hypertension blood pressure slightly on the higher side.  Will add furosemide which should help   Medication Adjustments/Labs and Tests Ordered: Current medicines are reviewed at length with the patient today.  Concerns regarding medicines are outlined above.  No orders of the defined types were placed in this encounter.  Medication changes: No orders of the defined types were placed in this encounter.   Signed, Park Liter, MD, [facility] 09/22/2022 8:52 AM    [place]

## 2022-09-22 NOTE — Patient Instructions (Signed)
Medication Instructions:   STOP: Losartan  START: Entresto 24-26 1 tablet twice daily  START: Lasix 20mg  1 tablet daily  START: Potassium 44mEq 1 tablet daily  Lab Work: 3rd floor [address] BMP ProBNP- today None Ordered If you have labs (blood work) drawn today and your tests are completely normal, you will receive your results only by: [employer] (if you have MyChart) OR A paper copy in the mail If you have any lab test that is abnormal or we need to change your treatment, we will call you to review the results.   Testing/Procedures: None Ordered   Follow-Up: At [facility], you and your health needs are our priority.  As part of our continuing mission to provide you with exceptional heart care, we have created designated Provider Care Teams.  These Care Teams include your primary Cardiologist (physician) and Advanced Practice Providers (APPs -  Physician Assistants and Nurse Practitioners) who all work together to provide you with the care you need, when you need it.  We recommend signing up for the patient portal called "MyChart".  Sign up information is provided on this After Visit Summary.  MyChart is used to connect with patients for Virtual Visits (Telemedicine).  Patients are able to view lab/test results, encounter notes, upcoming appointments, etc.  Non-urgent messages can be sent to your provider as well.   To learn more about what you can do with MyChart, go to [email or web address].    Your next appointment:   3 week(s)  The format for your next appointment:   In Person  Provider:   Jenne Campus, MD    Other Instructions NA

## 2022-09-23 ENCOUNTER — Encounter: Payer: Self-pay | Admitting: Cardiology

## 2022-09-23 LAB — BASIC METABOLIC PANEL
BUN/Creatinine Ratio: 16 (ref 10–24)
BUN: 19 mg/dL (ref 8–27)
CO2: 23 mmol/L (ref 20–29)
Calcium: 9.4 mg/dL (ref 8.6–10.2)
Chloride: 101 mmol/L (ref 96–106)
Creatinine, Ser: 1.21 mg/dL (ref 0.76–1.27)
Glucose: 104 mg/dL — ABNORMAL HIGH (ref 70–99)
Potassium: 3.6 mmol/L (ref 3.5–5.2)
Sodium: 138 mmol/L (ref 134–144)
eGFR: 63 mL/min/{1.73_m2} (ref >59)

## 2022-09-23 LAB — PRO B NATRIURETIC PEPTIDE: NT-Pro BNP: 1813 pg/mL — ABNORMAL HIGH (ref 0–376)

## 2022-09-29 ENCOUNTER — Ambulatory Visit: Payer: Medicare Other | Attending: Cardiology

## 2022-09-29 DIAGNOSIS — I480 Paroxysmal atrial fibrillation: Secondary | ICD-10-CM | POA: Diagnosis not present

## 2022-09-29 LAB — ECHOCARDIOGRAM COMPLETE
Area-P 1/2: 8.92 cm2
Calc EF: 40.6 %
MV M vel: 4.68 m/s
MV Peak grad: 87.6 mmHg
P 1/2 time: 633 msec
Radius: 0.9 cm
S' Lateral: 4.5 cm
Single Plane A2C EF: 42.2 %
Single Plane A4C EF: 38 %

## 2022-10-03 ENCOUNTER — Other Ambulatory Visit (HOSPITAL_BASED_OUTPATIENT_CLINIC_OR_DEPARTMENT_OTHER): Payer: Self-pay | Admitting: Emergency Medicine

## 2022-10-04 ENCOUNTER — Telehealth: Payer: Self-pay

## 2022-10-04 ENCOUNTER — Other Ambulatory Visit: Payer: Self-pay

## 2022-10-04 DIAGNOSIS — I420 Dilated cardiomyopathy: Secondary | ICD-10-CM

## 2022-10-04 DIAGNOSIS — I10 Essential (primary) hypertension: Secondary | ICD-10-CM

## 2022-10-04 MED ORDER — AMIODARONE HCL 200 MG PO TABS: 200.0000 mg | ORAL_TABLET | Freq: Two times a day (BID) | ORAL | 3 refills | Status: DC

## 2022-10-04 NOTE — Telephone Encounter (Signed)
-----   Message from Georgeanna Lea, MD sent at 09/27/2022  9:00 AM EDT ----- Results show some CHF that send blood test results with his, he is having echocardiogram on Thursday will wait for results of it to decide about therapy

## 2022-10-04 NOTE — Telephone Encounter (Signed)
-----   Message from Georgeanna Lea, MD sent at 09/30/2022 11:10 AM EDT ----- Echocardiogram confirmed presence of diminished ejection fraction but looks like it is already improving 35 to 40%, please check Chem-7 if Chem-7 is fine we will increase dose of Entresto.  Please also start amiodarone 200 mg twice daily

## 2022-10-04 NOTE — Telephone Encounter (Signed)
Patient notified of results and recommendations and agreed with plan, medication sent, lab order on file.

## 2022-10-04 NOTE — Telephone Encounter (Signed)
Patient notified of results.

## 2022-10-07 ENCOUNTER — Telehealth: Payer: Self-pay | Admitting: Family Medicine

## 2022-10-07 ENCOUNTER — Other Ambulatory Visit (HOSPITAL_COMMUNITY): Payer: Medicare Other

## 2022-10-07 ENCOUNTER — Encounter (HOSPITAL_COMMUNITY): Payer: Medicare Other

## 2022-10-07 ENCOUNTER — Other Ambulatory Visit (HOSPITAL_BASED_OUTPATIENT_CLINIC_OR_DEPARTMENT_OTHER): Payer: Self-pay

## 2022-10-07 MED ORDER — DILTIAZEM HCL 30 MG PO TABS
30.0000 mg | ORAL_TABLET | Freq: Four times a day (QID) | ORAL | 0 refills | Status: DC
  Filled 2022-10-07: qty 120, 30d supply, fill #0

## 2022-10-07 NOTE — Telephone Encounter (Signed)
Copied from CRM [id number]. Topic: Medicare AWV >> Oct 07, 2022 11:24 AM Payton Doughty wrote: Reason for CRM: Called patient to schedule Medicare Annual Wellness Visit (AWV). Left message for patient to call back and schedule Medicare Annual Wellness Visit (AWV).  Last date of AWV: none  Please schedule an appointment at any time with Kandis Cocking, LPN .  If any questions, please contact me.  Thank you ,  Verlee Rossetti; Care Guide Ambulatory Clinical Support [place] [facility] Direct Dial: [phone]

## 2022-10-10 ENCOUNTER — Encounter: Payer: Self-pay | Admitting: *Deleted

## 2022-10-13 ENCOUNTER — Emergency Department (HOSPITAL_BASED_OUTPATIENT_CLINIC_OR_DEPARTMENT_OTHER)
Admission: EM | Admit: 2022-10-13 | Discharge: 2022-10-13 | Disposition: A | Discharge status: Home / Self Care | Payer: Medicare Other | Attending: Emergency Medicine | Admitting: Emergency Medicine

## 2022-10-13 ENCOUNTER — Encounter (HOSPITAL_BASED_OUTPATIENT_CLINIC_OR_DEPARTMENT_OTHER): Payer: Self-pay | Admitting: Urology

## 2022-10-13 ENCOUNTER — Emergency Department (HOSPITAL_BASED_OUTPATIENT_CLINIC_OR_DEPARTMENT_OTHER): Payer: Medicare Other

## 2022-10-13 ENCOUNTER — Other Ambulatory Visit: Payer: Self-pay

## 2022-10-13 ENCOUNTER — Telehealth: Payer: Self-pay | Admitting: Cardiology

## 2022-10-13 DIAGNOSIS — Z79899 Other long term (current) drug therapy: Secondary | ICD-10-CM | POA: Diagnosis not present

## 2022-10-13 DIAGNOSIS — R0602 Shortness of breath: Secondary | ICD-10-CM | POA: Diagnosis not present

## 2022-10-13 DIAGNOSIS — I10 Essential (primary) hypertension: Secondary | ICD-10-CM | POA: Insufficient documentation

## 2022-10-13 DIAGNOSIS — Z7901 Long term (current) use of anticoagulants: Secondary | ICD-10-CM | POA: Insufficient documentation

## 2022-10-13 DIAGNOSIS — I4891 Unspecified atrial fibrillation: Secondary | ICD-10-CM | POA: Diagnosis not present

## 2022-10-13 LAB — CBC WITH DIFFERENTIAL/PLATELET
Abs Immature Granulocytes: 0.01 10*3/uL (ref 0.00–0.07)
Basophils Absolute: 0 10*3/uL (ref 0.0–0.1)
Basophils Relative: 1 %
Eosinophils Absolute: 0.2 10*3/uL (ref 0.0–0.5)
Eosinophils Relative: 3 %
HCT: 39.8 % (ref 39.0–52.0)
Hemoglobin: 13.2 g/dL (ref 13.0–17.0)
Immature Granulocytes: 0 %
Lymphocytes Relative: 21 %
Lymphs Abs: 1.2 10*3/uL (ref 0.7–4.0)
MCH: 31.5 pg (ref 26.0–34.0)
MCHC: 33.2 g/dL (ref 30.0–36.0)
MCV: 95 fL (ref 80.0–100.0)
Monocytes Absolute: 0.5 10*3/uL (ref 0.1–1.0)
Monocytes Relative: 8 %
Neutro Abs: 3.8 10*3/uL (ref 1.7–7.7)
Neutrophils Relative %: 67 %
Platelets: 105 10*3/uL — ABNORMAL LOW (ref 150–400)
RBC: 4.19 MIL/uL — ABNORMAL LOW (ref 4.22–5.81)
RDW: 13.9 % (ref 11.5–15.5)
WBC: 5.6 10*3/uL (ref 4.0–10.5)
nRBC: 0 % (ref 0.0–0.2)

## 2022-10-13 LAB — BASIC METABOLIC PANEL
Anion gap: 8 (ref 5–15)
BUN: 19 mg/dL (ref 8–23)
CO2: 22 mmol/L (ref 22–32)
Calcium: 9.1 mg/dL (ref 8.9–10.3)
Chloride: 106 mmol/L (ref 98–111)
Creatinine, Ser: 1.09 mg/dL (ref 0.61–1.24)
GFR, Estimated: >60 mL/min (ref >60)
Glucose, Bld: 83 mg/dL (ref 70–99)
Potassium: 4.2 mmol/L (ref 3.5–5.1)
Sodium: 136 mmol/L (ref 135–145)

## 2022-10-13 LAB — TROPONIN I (HIGH SENSITIVITY)
Troponin I (High Sensitivity): 17 ng/L (ref <18)
Troponin I (High Sensitivity): 17 ng/L (ref <18)

## 2022-10-13 LAB — BRAIN NATRIURETIC PEPTIDE: B Natriuretic Peptide: 339.3 pg/mL — ABNORMAL HIGH (ref 0.0–100.0)

## 2022-10-13 MED ORDER — FUROSEMIDE 10 MG/ML IJ SOLN
20.0000 mg | Freq: Once | INTRAMUSCULAR | Status: AC
  Administered 2022-10-13: 20 mg via INTRAVENOUS
  Filled 2022-10-13: qty 2

## 2022-10-13 MED ORDER — AMIODARONE HCL 200 MG PO TABS
200.0000 mg | ORAL_TABLET | Freq: Every day | ORAL | Status: DC
  Administered 2022-10-13: 200 mg via ORAL
  Filled 2022-10-13: qty 1

## 2022-10-13 MED ORDER — ALBUTEROL SULFATE HFA 108 (90 BASE) MCG/ACT IN AERS: 2.0000 | INHALATION_SPRAY | RESPIRATORY_TRACT | Status: DC | PRN

## 2022-10-13 MED ORDER — DILTIAZEM HCL 25 MG/5ML IV SOLN
10.0000 mg | Freq: Once | INTRAVENOUS | Status: AC
  Administered 2022-10-13: 10 mg via INTRAVENOUS
  Filled 2022-10-13: qty 5

## 2022-10-13 NOTE — Discharge Instructions (Signed)
Your workup shows signs of worsening congestive heart failure.  This may be related to poorly controlled heart rate, rapid heart rate, including A-fib.  I do recommend you take the medications prescribed by your cardiologist including amiodarone 200 mg once daily.  You should also continue the rest of your medications including your diuretic medicine.  Please follow-up with a cardiologist next week.  If your shortness of breath is getting worse, or you begin to have persistent chest pain, lightheadedness, feel like passing out, please return to the emergency department.

## 2022-10-13 NOTE — ED Provider Notes (Signed)
[place] [facility] AT [facility] Provider Note   CSN: [id number] Arrival date & time: 10/13/22  1225     History  Chief Complaint  Patient presents with   Shortness of Breath    Darius Mitchell is a 72 y.o. male with a history of paroxysmal A-fib, on anticoagulation, dyslipidemia, hypertension, presenting to the ED with shortness of breath.  Patient reports that his shortness of breath is worsening over the past few weeks since he was seen by myself in the ED exactly 1 month ago.  He notes dyspnea on exertion.  He notes orthopnea and says "I am breathing through her mouth at night and struggling to breathe".  He denies specifically leg swelling.  He has seen his cardiologist initially and a follow-up appointment after his last ED visit, at which time I had started him on diltiazem "as needed" for A-fib with RVR, and he reports that he is measuring his heart rate at home and typically it is 50-80 npm, not tachycardic.  He was seen subsequently in the outpatient cardiology office, where he had a stress test concerning for diminished ejection fraction in the neighborhood of 25% with no ischemia, subsequent echocardiogram on 09/29/22 with EF reported of 35 to 40%.  Per cardiology office note, not time Dr Bing Matter had wanted to start the patient on amiodarone 200 mg twice daily, but the patient reports he did not start taking this medication because he was concerned about side effects.  He has a follow-up appointment scheduled this week with the cardiologist, tomorrow, to discuss his results.  However he came to the ER because he feels like his shortness of breath is truly worsening overnight.  For his A-fib he is on Eliquis.  He is also on diltiazem "as needed".  On his last visit to the ED, we discussed the option of cardioversion but the patient preferred not to do this at the time.  HPI     Home Medications Prior to Admission medications   Medication Sig Start Date  End Date Taking? Authorizing Provider  amiodarone (PACERONE) 200 MG tablet Take 1 tablet (200 mg total) by mouth 2 (two) times daily. 10/04/22   Georgeanna Lea, MD  apixaban (ELIQUIS) 5 MG TABS tablet Take 1 tablet by mouth 2 (two) times daily. Patient taking differently: Take 5 mg by mouth 2 (two) times daily. 12/22/21 12/22/22  Georgeanna Lea, MD  atorvastatin (LIPITOR) 80 MG tablet TAKE 1 TABLET BY MOUTH EVERY DAY 09/13/22   Wendling, Jilda Roche, DO  diltiazem (CARDIZEM) 30 MG tablet Take 1 tablet (30 mg total) by mouth 4 (four) times daily. For heart rate above 100 beats per minute in AFib. 10/07/22 11/11/22  Daniella Dewberry, Kermit Balo, MD  ezetimibe (ZETIA) 10 MG tablet TAKE 1 TABLET BY MOUTH EVERY DAY 09/13/22   Wendling, Jilda Roche, DO  furosemide (LASIX) 20 MG tablet Take 1 tablet (20 mg total) by mouth daily. 09/22/22   Georgeanna Lea, MD  Glucosamine-Chondroit-Biofl-Mn (GLUCOSAMINE CHONDROIT,BIOFLAV,) CAPS Take 1 tablet by mouth daily.    [provider]  hydrochlorothiazide (HYDRODIURIL) 25 MG tablet TAKE 1 TABLET (25 MG TOTAL) BY MOUTH DAILY. 09/13/22   [name], DO  levothyroxine (SYNTHROID) 125 MCG tablet TAKE 1 TABLET BY MOUTH ONCE DAILY BEFORE BREAKFAST 08/31/22 08/31/23  [name], DO  Multiple Vitamins-Minerals (CENTRUM SILVER ULTRA MENS) TABS Take 1 tablet by mouth daily. Unknown strength    [provider]  omeprazole (PRILOSEC) 40  MG capsule TAKE 1 CAPSULE BY MOUTH EVERY DAY 09/13/22   [name], DO  PARoxetine (PAXIL) 30 MG tablet TAKE 1 TABLET BY MOUTH EVERY DAY 09/13/22   Wendling, Jilda Roche, DO  potassium chloride (KLOR-CON) 10 MEQ tablet Take 1 tablet (10 mEq total) by mouth daily. 09/22/22   Georgeanna Lea, MD  sacubitril-valsartan (ENTRESTO) 24-26 MG Take 1 tablet by mouth 2 (two) times daily. 09/22/22   Georgeanna Lea, MD  metoprolol succinate (TOPROL-XL) 25 MG 24 hr tablet TAKE 1 TABLET BY MOUTH ONCE  DAILY 12/18/19 05/18/20  Georgeanna Lea, MD      Allergies    Codeine, Niacin, and Niacin and related    Review of Systems   Review of Systems  Physical Exam Updated Vital Signs BP (!) 118/94   Pulse 72   Temp 97.6 F (36.4 C) (Oral)   Resp 16   Ht 5\' 11"  (1.803 m)   Wt 92.9 kg   SpO2 95%   BMI 28.56 kg/m  Physical Exam Constitutional:      General: He is not in acute distress. HENT:     Head: Normocephalic and atraumatic.  Eyes:     Conjunctiva/sclera: Conjunctivae normal.     Pupils: Pupils are equal, round, and reactive to light.  Cardiovascular:     Rate and Rhythm: Tachycardia present. Rhythm irregular.     Comments: HR 120-140 bpm Pulmonary:     Effort: Pulmonary effort is normal. No respiratory distress.     Comments: Fine crackles in the lower lung fields bilaterally Abdominal:     General: There is no distension.     Tenderness: There is no abdominal tenderness.  Skin:    General: Skin is warm and dry.  Neurological:     General: No focal deficit present.     Mental Status: He is alert. Mental status is at baseline.  Psychiatric:        Mood and Affect: Mood normal.        Behavior: Behavior normal.     ED Results / Procedures / Treatments   Labs (all labs ordered are listed, but only abnormal results are displayed) Labs Reviewed  CBC WITH DIFFERENTIAL/PLATELET - Abnormal; Notable for the following components:      Result Value   RBC 4.19 (*)    Platelets 105 (*)    All other components within normal limits  BRAIN NATRIURETIC PEPTIDE - Abnormal; Notable for the following components:   B Natriuretic Peptide 339.3 (*)    All other components within normal limits  BASIC METABOLIC PANEL  TROPONIN I (HIGH SENSITIVITY)  TROPONIN I (HIGH SENSITIVITY)    EKG EKG Interpretation  Date/Time:  Thursday October 13 2022 12:34:27 EDT Ventricular Rate:  105 PR Interval:    QRS Duration: 97 QT Interval:  337 QTC Calculation: 446 R Axis:   83 Text  Interpretation: Atrial fibrillation Ventricular premature complex Borderline right axis deviation RSR' in V1 or V2, probably normal variant Confirmed by Alvester Chou [phone]) on 10/13/2022 12:48:08 PM  Radiology DG Chest 2 View  Result Date: 10/13/2022 CLINICAL DATA:  Shortness of breath. EXAM: CHEST - 2 VIEW COMPARISON:  Chest radiographs 09/12/2022 and 11/23/2018 FINDINGS: Cardiac silhouette is again mildly enlarged. Mediastinal contours are within normal limits. Mild bilateral lower lung interstitial thickening appears mildly improved from 09/12/2022 and again increased from 11/23/2018. No focal airspace opacity to indicate pneumonia. No pleural effusion or pneumothorax. Mild multilevel degenerative disc changes of the thoracic  spine. IMPRESSION: Mild bilateral lower lung interstitial thickening appears mildly improved from 09/12/2022 and again increased from 11/23/2018. Findings suggest improved or resolved interstitial pulmonary edema with likely underlying baseline scarring. Electronically Signed   By: Neita Garnet M.D.   On: 10/13/2022 13:49    Procedures .Critical Care  Performed by: Terald Sleeper, MD Authorized by: Terald Sleeper, MD   Critical care provider statement:    Critical care time (minutes):  35   Critical care time was exclusive of:  Separately billable procedures and treating other patients   Critical care was necessary to treat or prevent imminent or life-threatening deterioration of the following conditions:  Circulatory failure   Critical care was time spent personally by me on the following activities:  Ordering and performing treatments and interventions, ordering and review of laboratory studies, ordering and review of radiographic studies, pulse oximetry, review of old charts, examination of patient and evaluation of patient's response to treatment Comments:     Rate control medication for A-fib with RVR, telemetry monitoring, diuresis for congestive heart  failure     Medications Ordered in ED Medications  albuterol (VENTOLIN HFA) 108 (90 Base) MCG/ACT inhaler 2 puff (has no administration in time range)  amiodarone (PACERONE) tablet 200 mg (200 mg Oral Given 10/13/22 1445)  diltiazem (CARDIZEM) injection 10 mg (10 mg Intravenous Given 10/13/22 1329)  furosemide (LASIX) injection 20 mg (20 mg Intravenous Given 10/13/22 1445)    ED Course/ Medical Decision Making/ A&P Clinical Course as of 10/13/22 1550  Thu Oct 13, 2022  1433 Patient was reassessed.  His heart rate remains in A-fib, now improved 90-110 bpm after the diltiazem bolus.  His labs are most consistent with mild congestive heart failure, which is suspect is related to poor heart rate control.  I do recommend that he initiate the amiodarone, which his own cardiologist had prescribed, we will give him his first dose here in the ED and observe him for a period of time on telemetry.  We will also diurese him with 20 mg of IV Lasix (he takes 20 mg orally by mouth in the morning).  Overall I do not see any emergent findings to warrant hospitalization at this time, if the delta troponins remain flat, I would anticipate he could be discharged with very close follow-up as he has a cardiology appointment tomorrow morning [MT]  1434 . [MT]  1521 Pt signed out to Dr Alona Bene EDP pending 2nd trop, monitoring of tele HR. if no emergent findings patient could be discharged with cardiology follow-up tomorrow [MT]    Clinical Course User Index [MT] Ayan Yankey, Kermit Balo, MD                             Medical Decision Making Amount and/or Complexity of Data Reviewed Labs: ordered. Radiology: ordered.  Risk Prescription drug management.   This patient presents to the ED with concern for shortness of breath. This involves an extensive number of treatment options, and is a complaint that carries with it a high risk of complications and morbidity.  The differential diagnosis includes nonischemic  cardiomyopathy in the setting of poorly controlled A-fib, most likely, versus pneumonia, versus viral URI, versus anemia, versus other.  Patient is currently in A-fib with RVR in the room, even completely at rest.  He reports that this may be contributable to his anxiety, and reports that his home heart rate levels have not been elevated,  but I wonder whether he may be getting an accurate readings from his blood pressure cuff, and whether he has truly had poorly controlled A-fib for several weeks.  Co-morbidities that complicate the patient evaluation: History of A-fib, at high risk of arrhythmia exacerbation  Additional history obtained from patient's wife at bedside  External records from outside source obtained and reviewed including cardiology office evaluation this month, including echocardiogram and stress test results.  I ordered and personally interpreted labs.  The pertinent results include: No emergent findings.  Repeat troponin pending at the time of signout  I ordered imaging studies including x-ray of the chest I independently visualized and interpreted imaging which showed pulmonary edema pattern I agree with the radiologist interpretation  The patient was maintained on a cardiac monitor.  I personally viewed and interpreted the cardiac monitored which showed an underlying rhythm of: A-fib with RVR  Per my interpretation the patient's ECG shows A-fib with RVR with no acute ischemic findings  I ordered medication including diltiazem for A Fib with RVR, amiodarone for rate control (oral), lasix for diuresis  I have reviewed the patients home medicines and have made adjustments as needed  Test Considered: I have a low suspicion for acute PE in this clinical setting and do not feel that a CT pulmonary angiogram is emergently indicated at this time.  After the interventions noted above, I reevaluated the patient and found that they have: improved - HR  improved  Dispostion:  Signed out to Dr Jacqulyn Bath EDP          Final Clinical Impression(s) / ED Diagnoses Final diagnoses:  Atrial fibrillation with RVR  Shortness of breath    Rx / DC Orders ED Discharge Orders     None         Terald Sleeper, MD 10/13/22 1550

## 2022-10-13 NOTE — Telephone Encounter (Signed)
Called patient and his wife reported that he is currently SOB. He has always been SOB but it has been getting progressively worse since Tuesday night into Wednesday night. The patients wife states that he is having a difficult time taking a full breath. He is currently mouth breathing and taking shallow breaths. Patient's wife states that he has no other symptoms and his blood pressure is 140/86 which is high for him. Spoke with Dr. Bing Matter regarding the patient's symptoms and he recommended that the patient go to the ER. I informed the patient's wife that the physician recommended that he go to the ER and she stated that they would go to the [facility]. Patient's wife had no further questions at this time.

## 2022-10-13 NOTE — ED Provider Notes (Signed)
Blood pressure (!) 118/94, pulse 72, temperature 97.6 F (36.4 C), temperature source Oral, resp. rate 16, height  (1.803 m), weight 92.9 kg, SpO2 95 %.  Assuming care from Dr. Renaye Rakers.  In short, Darius Mitchell is a 72 y.o. male with a chief complaint of Shortness of Breath .  Refer to the original H&P for additional details.  The current plan of care is to observe in the ED after starting Amiodarone here. Patient with Cardiology appointment in the AM.  05:02 PM  Patient observed in the emergency department and he remains in A-fib with heart rate mainly in the low 100 range.  He is completely asymptomatic.  No increased work of breathing or signs of volume overload clinically. No CP.  He will begin taking his amiodarone and has follow-up with his cardiologist in the morning.  I did discuss with him that my preference would be observation in the hospital and amiodarone infusion. However, after further discussion patient wishes to see his cardiologist in the morning. Will allow for d/c but discussed strict ED return precautions with patient and wife at bedside.     Maia Plan, MD 10/13/22 1705

## 2022-10-13 NOTE — ED Triage Notes (Signed)
Pt states has appointment with cardiology tomorrow to review a lot of tests that he recently had States has been having a hard time taking a deep breath x 2-3 months Has been taking lasix  States " I am mouth breathing at night".  Denies any pain

## 2022-10-13 NOTE — Telephone Encounter (Signed)
Pt c/o Shortness Of Breath: STAT if SOB developed within the last 24 hours or pt is noticeably SOB on the phone  1. Are you currently SOB (can you hear that pt is SOB on the phone)? Spoke with wife, she states he is currently SOB  2. How long have you been experiencing SOB? Has always been SOB, but has been since Tuesday night into Wednesday night.    3. Are you SOB when sitting or when up moving around? Wife states all times, she states it's like it can't take a full breath. Wife states he is breathing through his mouth.   4. Are you currently experiencing any other symptoms? Wife states no other symptoms.  BP is 140/86, wife states that high for him.

## 2022-10-14 ENCOUNTER — Encounter: Payer: Self-pay | Admitting: Cardiology

## 2022-10-14 ENCOUNTER — Other Ambulatory Visit: Payer: Self-pay | Admitting: Family Medicine

## 2022-10-14 ENCOUNTER — Ambulatory Visit: Payer: Medicare Other | Attending: Cardiology | Admitting: Cardiology

## 2022-10-14 VITALS — BP 108/64 | HR 69 | Ht 71.0 in | Wt 199.0 lb

## 2022-10-14 DIAGNOSIS — I420 Dilated cardiomyopathy: Secondary | ICD-10-CM | POA: Diagnosis not present

## 2022-10-14 DIAGNOSIS — I7300 Raynaud's syndrome without gangrene: Secondary | ICD-10-CM

## 2022-10-14 DIAGNOSIS — I480 Paroxysmal atrial fibrillation: Secondary | ICD-10-CM | POA: Diagnosis not present

## 2022-10-14 DIAGNOSIS — I10 Essential (primary) hypertension: Secondary | ICD-10-CM | POA: Diagnosis not present

## 2022-10-14 NOTE — Patient Instructions (Addendum)
Medication Instructions:  Your physician recommends that you continue on your current medications as directed. Please refer to the Current Medication list given to you today.  *If you need a refill on your cardiac medications before your next appointment, please call your pharmacy*   Lab Work: None Ordered If you have labs (blood work) drawn today and your tests are completely normal, you will receive your results only by: MyChart Message (if you have MyChart) OR A paper copy in the mail If you have any lab test that is abnormal or we need to change your treatment, we will call you to review the results.   Testing/Procedures: None Ordered   Follow-Up: At [facility], you and your health needs are our priority.  As part of our continuing mission to provide you with exceptional heart care, we have created designated Provider Care Teams.  These Care Teams include your primary Cardiologist (physician) and Advanced Practice Providers (APPs -  Physician Assistants and Nurse Practitioners) who all work together to provide you with the care you need, when you need it.  We recommend signing up for the patient portal called "MyChart".  Sign up information is provided on this After Visit Summary.  MyChart is used to connect with patients for Virtual Visits (Telemedicine).  Patients are able to view lab/test results, encounter notes, upcoming appointments, etc.  Non-urgent messages can be sent to your provider as well.   To learn more about what you can do with MyChart, go to [email or web address].    Your next appointment:   3 week(s)  The format for your next appointment:   In Person  Provider:   Gypsy Balsam, MD    Other Instructions: Referral to see Dr. Elberta Fortis

## 2022-10-14 NOTE — Progress Notes (Unsigned)
Cardiology Office Note:    Date:  10/14/2022   ID:  Darius Mitchell, DOB 07-Jan-1951, MRN [id number]  PCP:  [name], DO  Cardiologist:  Gypsy Balsam, MD    Referring MD: [name]*   Chief Complaint  Patient presents with   Shortness of Breath     [facility]    History of Present Illness:    Darius Mitchell is a 72 y.o. male medical history significant for paroxysmal atrial fibrillation, anticoagulated, essential hypertension, dyslipidemia, last time I have seen him he started having palpitations more was in atrial fibrillation we decided to start antiarrhythmic therapy were getting ready to start flecainide as a part of evaluation the stress test has been done which showed no evidence of ischemia but significant reduction of left ventricle ejection fraction.  That was confirmed by doing echocardiogram which showed ejection fraction 35 to 40%.  Trying to put him on guideline directed medical therapy.  He is still in atrial fibrillation today he is not doing well interestingly he tells me he did not have to restrict the amount of exercise and work that he does in the garden however last night he end up going to the hospital because he started having some difficulty breathing at night.  He said he breathes shallow right in usual deep.  Suspect portion of what may be some anxiety.  Denies have any chest pain tightness squeezing pressure burning chest, biochemical markers were negative for myocardial injury, he is proBNP is significantly improved.  Last time I wanted him to start with him he underwent intention to cardiovert him with the next few weeks he did not do it until yesterday.  He comes today to my office is feeling better.  Past Medical History:  Diagnosis Date   Afib    Anxiety and depression 03/25/2014   BMI 30.0-30.9,adult 05/15/2015   Carpal tunnel syndrome of right wrist 10/23/2015   Diverticulosis of colon without hemorrhage 03/17/2016    Essential hypertension, benign 03/25/2014   GERD (gastroesophageal reflux disease)    Hyperlipidemia    Hypothyroidism    Low testosterone 05/15/2015   Paroxysmal atrial fibrillation 12/03/2018   Prostate cancer screening encounter, options and risks discussed 04/30/2014   Screening for ischemic heart disease 04/30/2014   Swelling of both lower extremities 11/29/2017   Visit for preventive health examination 04/30/2014    Past Surgical History:  Procedure Laterality Date   APPENDECTOMY  1971   WISDOM TOOTH EXTRACTION  1972    Current Medications: Current Meds  Medication Sig   amiodarone (PACERONE) 200 MG tablet Take 1 tablet (200 mg total) by mouth 2 (two) times daily.   apixaban (ELIQUIS) 5 MG TABS tablet Take 1 tablet by mouth 2 (two) times daily. (Patient taking differently: Take 5 mg by mouth 2 (two) times daily.)   atorvastatin (LIPITOR) 80 MG tablet TAKE 1 TABLET BY MOUTH EVERY DAY (Patient taking differently: Take 80 mg by mouth daily.)   diltiazem (CARDIZEM) 30 MG tablet Take 1 tablet (30 mg total) by mouth 4 (four) times daily. For heart rate above 100 beats per minute in AFib.   ezetimibe (ZETIA) 10 MG tablet TAKE 1 TABLET BY MOUTH EVERY DAY (Patient taking differently: Take 10 mg by mouth daily.)   furosemide (LASIX) 20 MG tablet Take 1 tablet (20 mg total) by mouth daily.   Glucosamine-Chondroit-Biofl-Mn (GLUCOSAMINE CHONDROIT,BIOFLAV,) CAPS Take 1 tablet by mouth daily.   levothyroxine (SYNTHROID) 125 MCG tablet  TAKE 1 TABLET BY MOUTH ONCE DAILY BEFORE BREAKFAST (Patient taking differently: Take 125 mcg by mouth daily before breakfast.)   Multiple Vitamins-Minerals (CENTRUM SILVER ULTRA MENS) TABS Take 1 tablet by mouth daily. Unknown strength   omeprazole (PRILOSEC) 40 MG capsule TAKE 1 CAPSULE BY MOUTH EVERY DAY (Patient taking differently: Take 40 mg by mouth daily.)   PARoxetine (PAXIL) 30 MG tablet TAKE 1 TABLET BY MOUTH EVERY DAY (Patient taking differently: Take  30 mg by mouth daily.)   potassium chloride (KLOR-CON) 10 MEQ tablet Take 1 tablet (10 mEq total) by mouth daily.   sacubitril-valsartan (ENTRESTO) 24-26 MG Take 1 tablet by mouth 2 (two) times daily.   [DISCONTINUED] metoprolol succinate (TOPROL-XL) 25 MG 24 hr tablet TAKE 1 TABLET BY MOUTH ONCE DAILY     Allergies:   Codeine, Niacin, and Niacin and related   Social History   Socioeconomic History   Marital status: Married    Spouse name: Not on file   Number of children: Not on file   Years of education: Not on file   Highest education level: Not on file  Occupational History   Not on file  Tobacco Use   Smoking status: Former    Packs/day: 0.25    Years: 25.00    Additional pack years: 0.00    Total pack years: 6.25    Types: Cigarettes    Quit date: 05/28/2019    Years since quitting: 3.3   Smokeless tobacco: Never  Vaping Use   Vaping Use: Never used  Substance and Sexual Activity   Alcohol use: Never   Drug use: Never   Sexual activity: Not on file  Other Topics Concern   Not on file  Social History Narrative   Not on file   Social Determinants of Health   Financial Resource Strain: Not on file  Food Insecurity: Not on file  Transportation Needs: Not on file  Physical Activity: Not on file  Stress: Not on file  Social Connections: Not on file     Family History: The patient's family history includes Healthy in his brother, daughter, and sister; Heart attack in his mother; Heart disease in his father; Hypertension in his daughter; Lymphoma (age of onset: 31) in his father; Stomach cancer in his paternal grandfather; Stroke in his maternal grandmother; Stroke (age of onset: 50) in his mother. ROS:   Please see the history of present illness.    All 14 point review of systems negative except as described per history of present illness  EKGs/Labs/Other Studies Reviewed:      Recent Labs: 07/19/2022: ALT 20; TSH 2.42 09/12/2022: Magnesium 2.1 09/22/2022:  NT-Pro BNP 1,813 10/13/2022: B Natriuretic Peptide 339.3; BUN 19; Creatinine, Ser 1.09; Hemoglobin 13.2; Platelets 105; Potassium 4.2; Sodium 136  Recent Lipid Panel    Component Value Date/Time   CHOL 119 07/19/2022 1318   TRIG 83.0 07/19/2022 1318   HDL 44.40 07/19/2022 1318   CHOLHDL 3 07/19/2022 1318   VLDL 16.6 07/19/2022 1318   LDLCALC 58 07/19/2022 1318   LDLCALC 112 (H) 03/31/2020 0837   LDLDIRECT 94.0 10/24/2019 0733    Physical Exam:    VS:  BP 108/64 (BP Location: Left Arm, Patient Position: Sitting)   Pulse 69   Ht  (1.803 m)   Wt 199 lb (90.3 kg)   SpO2 98%   BMI 27.75 kg/m     Wt Readings from Last 3 Encounters:  10/14/22 199 lb (90.3 kg)  10/13/22 204  lb 12.9 oz (92.9 kg)  09/22/22 205 lb (93 kg)     GEN:  Well nourished, well developed in no acute distress HEENT: Normal NECK: No JVD; No carotid bruits LYMPHATICS: No lymphadenopathy CARDIAC: Irregular irregular, no murmurs, no rubs, no gallops RESPIRATORY:  Clear to auscultation without rales, wheezing or rhonchi  ABDOMEN: Soft, non-tender, non-distended MUSCULOSKELETAL:  No edema; No deformity  SKIN: Warm and dry LOWER EXTREMITIES: no swelling NEUROLOGIC:  Alert and oriented x 3 PSYCHIATRIC:  Normal affect   ASSESSMENT:    1. Paroxysmal atrial fibrillation   2. Dilated cardiomyopathy   3. Essential hypertension, benign    PLAN:    In order of problems listed above:  Paroxysmal atrial fibrillation.  She is on amiodarone, anticoagulated will continue like this for about 3 to 4 weeks and then if he does not convert spontaneously will consider cardioversion.  He also will be referred to our EP team for consideration of atrial fibrillation ablation. Dilated cardiomyopathy, he does have difficulty tolerating some medications.  Beta-blocker has been withdrawn previously because of bradycardia while in sinus rhythm.  He is on Entresto 24-26.  Etiology of this phenomenon is unclear at the moment  possibility of atrial fibrillation with fast ventricular rate.  Will try to get him back to normal rhythm and see if he improves.  If not we will do known ischemic cardiac workup. When I see him I anticipate need to increase the dose of this medication. Essential hypertension blood pressure actually slightly on the lower side.   Medication Adjustments/Labs and Tests Ordered: Current medicines are reviewed at length with the patient today.  Concerns regarding medicines are outlined above.  No orders of the defined types were placed in this encounter.  Medication changes: No orders of the defined types were placed in this encounter.   Signed, Georgeanna Lea, MD, [facility] 10/14/2022 11:01 AM    [facility]

## 2022-10-16 ENCOUNTER — Encounter: Payer: Self-pay | Admitting: Cardiology

## 2022-10-24 ENCOUNTER — Other Ambulatory Visit (HOSPITAL_BASED_OUTPATIENT_CLINIC_OR_DEPARTMENT_OTHER): Payer: Self-pay

## 2022-10-26 ENCOUNTER — Encounter: Payer: Self-pay | Admitting: Cardiology

## 2022-10-27 ENCOUNTER — Telehealth: Payer: Self-pay | Admitting: Cardiology

## 2022-10-27 IMAGING — DX DG CERVICAL SPINE 2 OR 3 VIEWS
3 series · 3 of 3 positions shown · non-contrast
Comparison: No recent prior.

CLINICAL DATA: Left-sided neck pain.  No known injury.

EXAM:
CERVICAL SPINE - 2-3 VIEW

[c-spine lat]
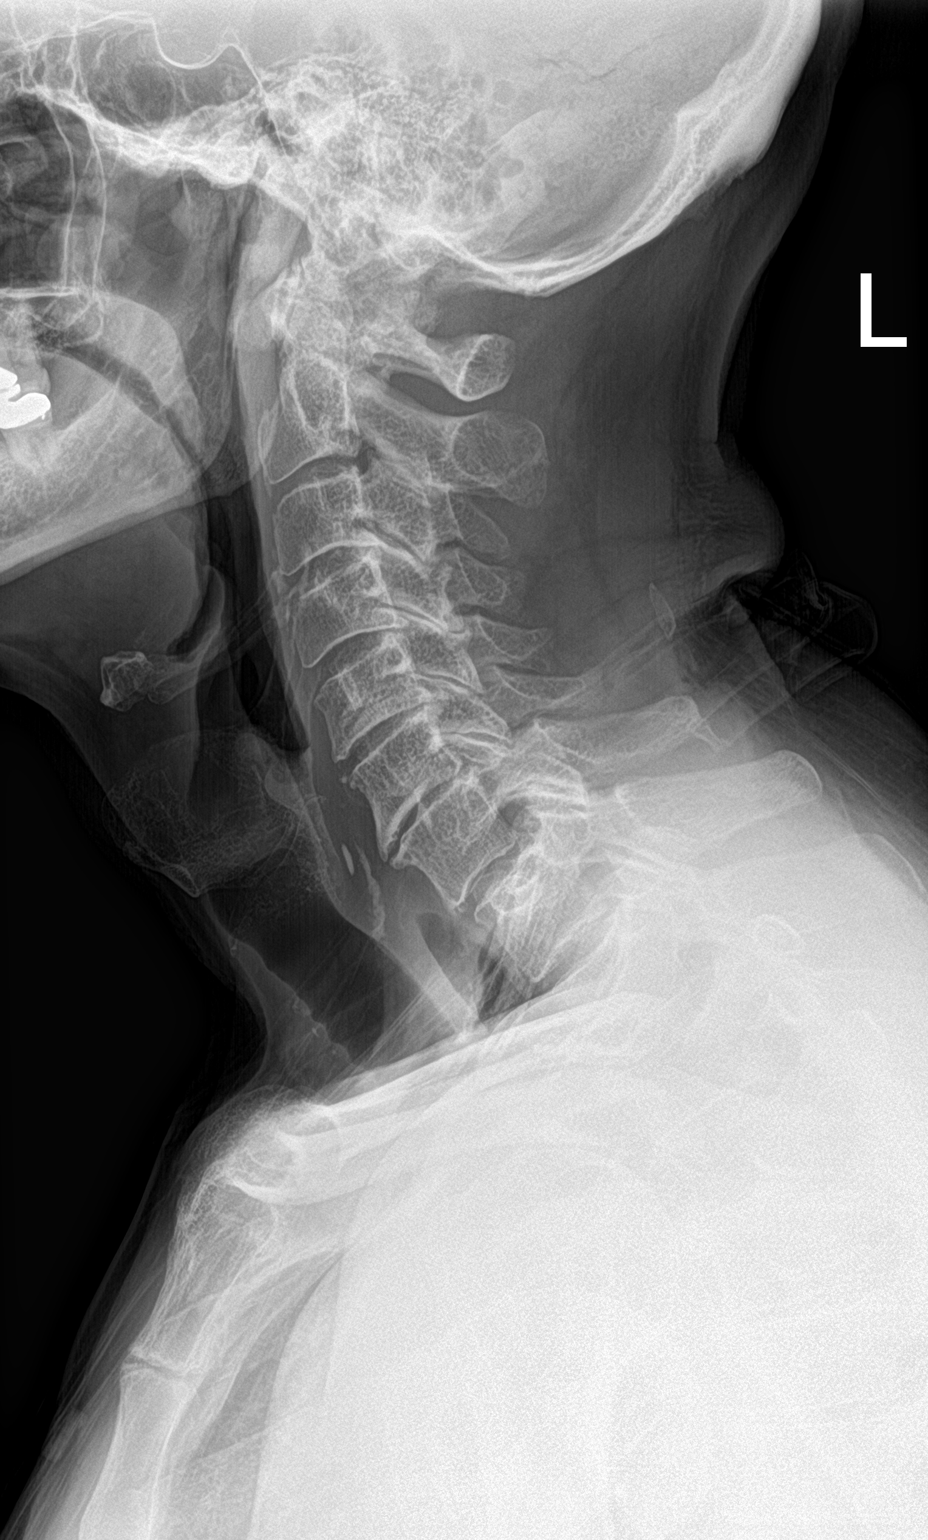

[c-spine ap]
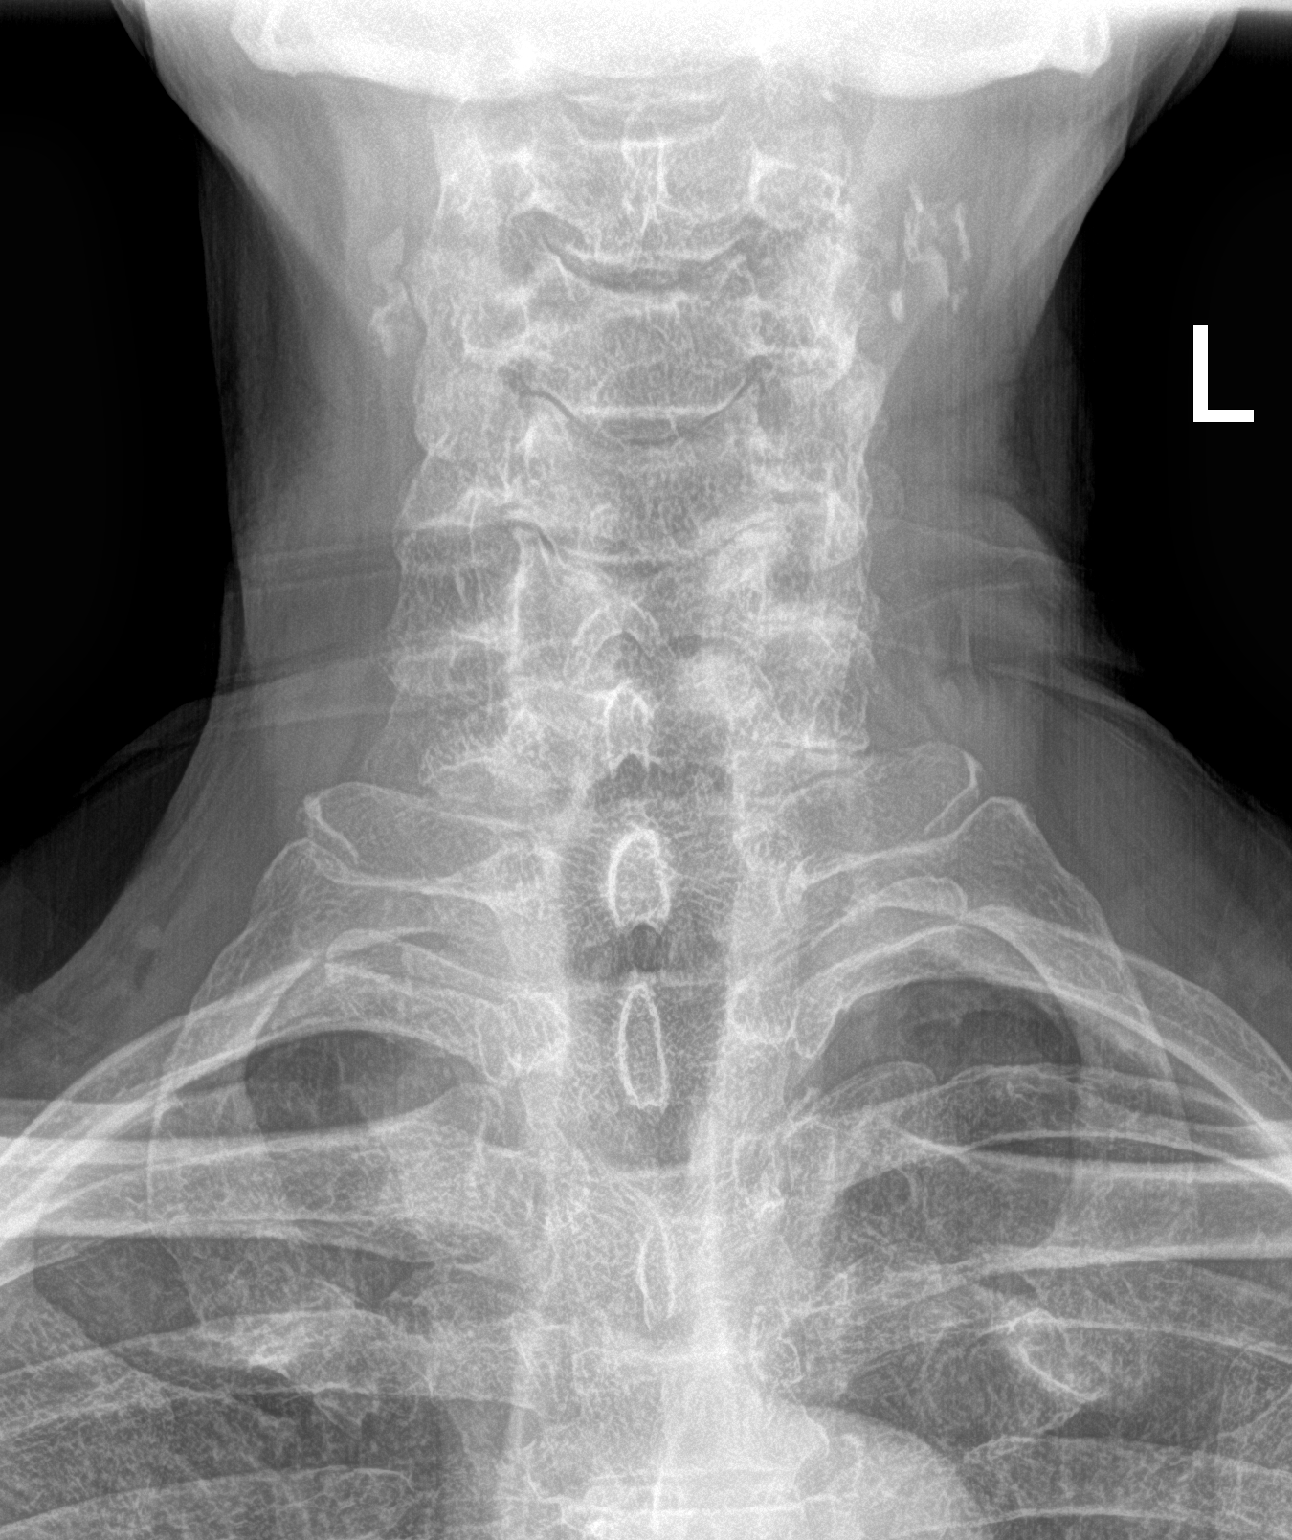

[c-spine open mouth]
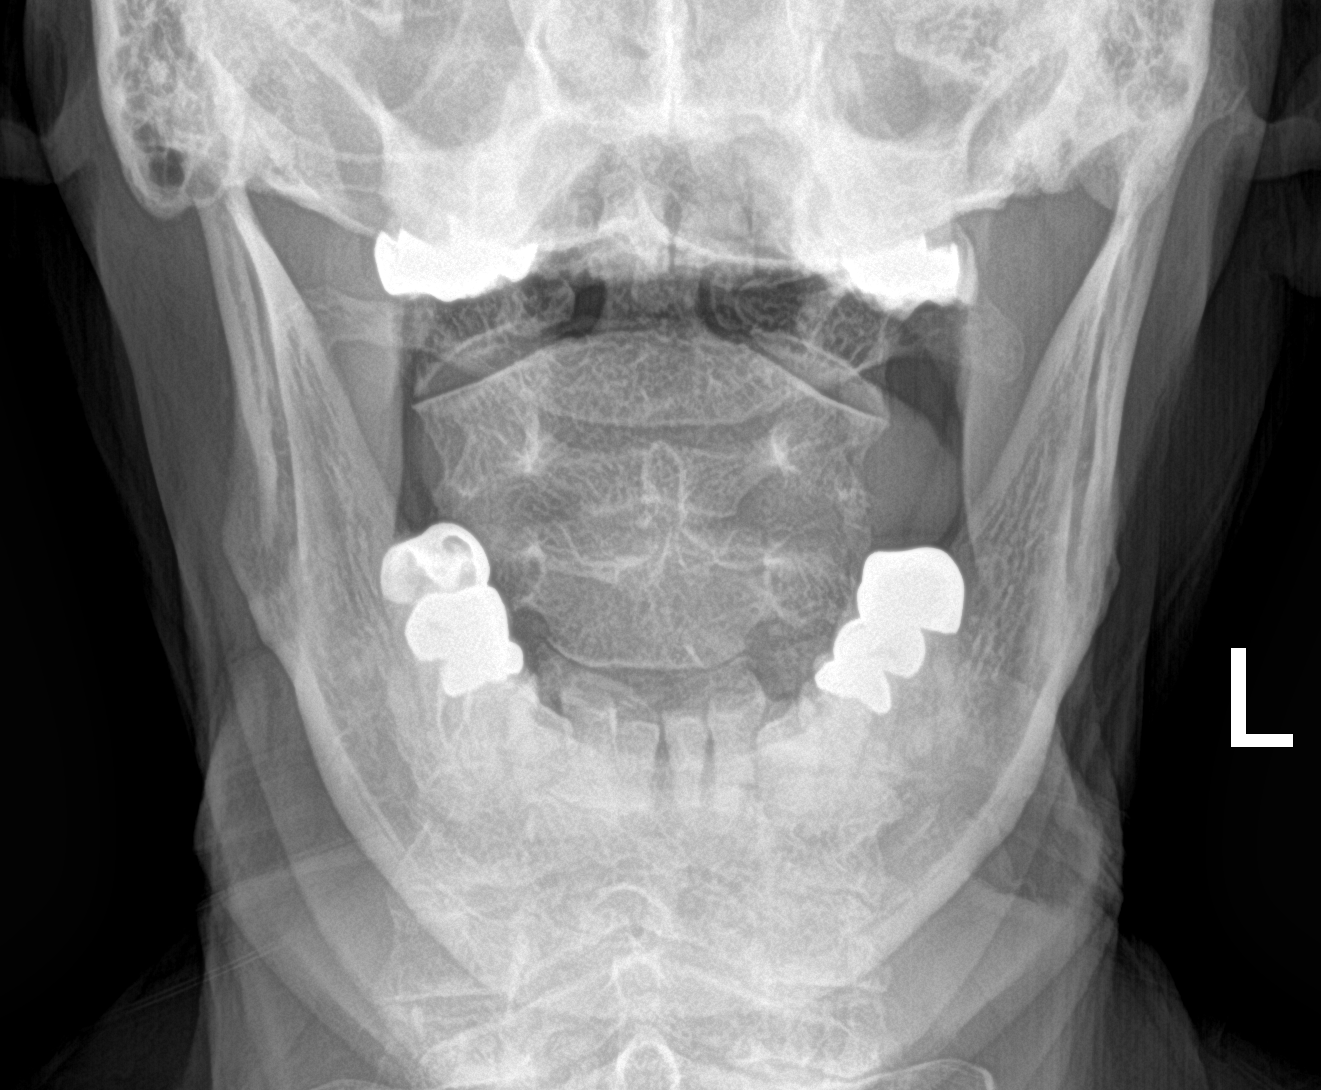

[3 of 3 positions shown; findings below may reference images not displayed]

FINDINGS: Diffuse multilevel degenerative change with prominent disc space
loss and endplate osteophyte formation noted at C5-C6 and C6-C7.
Diffuse facet hypertrophy. No evidence of fracture or dislocation.
Pulmonary apices are clear. Carotid vascular calcification.
IMPRESSION: Diffuse degenerative change, most prominent at C5-C6 and C6-C7.
Diffuse facet hypertrophy. No acute bony abnormality.

2.  Carotid vascular disease.

## 2022-10-27 NOTE — Telephone Encounter (Signed)
Spoke with Clydie Braun who is very upset/tearful that the pt is having chest pressure and that his appt is not until 11/07/22. Note states that the pt has afib and that he needs to be on Amiodarone for 3-4 weeks and if not converted they will cardiovert. Appt changes to 10/31/22 which will be 3 weeks as the pt feels something needs to be done sooner rather than later. Advised to get to appointment early as he is double booked at 8. Clydie Braun verbalized understanding and had no additional questions.

## 2022-10-27 NOTE — Telephone Encounter (Signed)
Pt c/o of Chest Pain: STAT if CP now or developed within 24 hours  1. Are you having CP right now? No  2. Are you experiencing any other symptoms (ex. SOB, nausea, vomiting, sweating)? SOB  3. How long have you been experiencing CP? 3 weeks  4. Is your CP continuous or coming and going? Coming and going  5. Have you taken Nitroglycerin? No  Patient's wife called stating that the patient has been having SOB when moving around, feels weak, gas in the stomach and feels like their is weight on his chest. Patient's wife stated that the patient is not able to sleep at night. Please advise. ?

## 2022-10-31 ENCOUNTER — Encounter: Payer: Self-pay | Admitting: Cardiology

## 2022-10-31 ENCOUNTER — Ambulatory Visit: Payer: Medicare Other | Attending: Cardiology | Admitting: Cardiology

## 2022-10-31 VITALS — BP 100/60 | HR 120 | Ht 71.0 in | Wt 203.0 lb

## 2022-10-31 DIAGNOSIS — I420 Dilated cardiomyopathy: Secondary | ICD-10-CM | POA: Diagnosis not present

## 2022-10-31 DIAGNOSIS — I10 Essential (primary) hypertension: Secondary | ICD-10-CM

## 2022-10-31 DIAGNOSIS — I480 Paroxysmal atrial fibrillation: Secondary | ICD-10-CM | POA: Diagnosis not present

## 2022-10-31 DIAGNOSIS — E782 Mixed hyperlipidemia: Secondary | ICD-10-CM

## 2022-10-31 DIAGNOSIS — I4819 Other persistent atrial fibrillation: Secondary | ICD-10-CM | POA: Insufficient documentation

## 2022-10-31 NOTE — Patient Instructions (Addendum)
   Dear Darius Mitchell  You are scheduled for a Cardioversion on Wednesday, May 8 with Dr. Servando Salina.  Please arrive at the [facility] (Main Entrance A) at [facility]: [address] [place], [place] [address] at 8:30 (This time is 1 hour(s) before your procedure to ensure your preparation). Free valet parking service is available. You will check in at ADMITTING. The support person will be asked to wait in the waiting room.  It is OK to have someone drop you off and come back when you are ready to be discharged.     DIET:  Nothing to eat or drink after midnight except a sip of water with medications (see medication instructions below)  MEDICATION INSTRUCTIONS: !!IF ANY NEW MEDICATIONS ARE STARTED AFTER TODAY, PLEASE NOTIFY YOUR PROVIDER AS SOON AS POSSIBLE!!  FYI: Medications such as Semaglutide (Ozempic, [place]), Tirzepatide (Mounjaro, Zepbound), Dulaglutide (Trulicity), etc ("GLP1 agonists") must be held around the time of a procedure. Talk to your provider if you take one of these. Continue taking your anticoagulant (blood thinner): Apixaban (Eliquis).  You will need to continue this after your procedure until you are told by your provider that it is safe to stop.    LABS: CBC, BMP- today 3rd Floor [address]   FYI:  For your safety, and to allow [place] to monitor your vital signs accurately during the surgery/procedure we request: If you have artificial nails, gel coating, SNS etc, please have those removed prior to your surgery/procedure. Not having the nail coverings /polish removed may result in cancellation or delay of your surgery/procedure.  You must have a responsible person to drive you home and stay in the waiting area during your procedure. Failure to do so could result in cancellation.  Bring your insurance cards.  *Special Note: Every effort is made to have your procedure done on time. Occasionally there are emergencies that occur at the hospital that may cause delays.  Please be patient if a delay does occur.    Follow-Up: At [facility], you and your health needs are our priority.  As part of our continuing mission to provide you with exceptional heart care, we have created designated Provider Care Teams.  These Care Teams include your primary Cardiologist (physician) and Advanced Practice Providers (APPs -  Physician Assistants and Nurse Practitioners) who all work together to provide you with the care you need, when you need it.  We recommend signing up for the patient portal called "MyChart".  Sign up information is provided on this After Visit Summary.  MyChart is used to connect with patients for Virtual Visits (Telemedicine).  Patients are able to view lab/test results, encounter notes, upcoming appointments, etc.  Non-urgent messages can be sent to your provider as well.   To learn more about what you can do with MyChart, go to [email or web address].    Your next appointment:   2 month(s)  The format for your next appointment:   In Person  Provider:   Gypsy Balsam, MD    Other Instructions NA

## 2022-10-31 NOTE — Addendum Note (Signed)
Addended by: Baldo Ash D on: 10/31/2022 12:16 PM   Modules accepted: Orders

## 2022-10-31 NOTE — Progress Notes (Signed)
Cardiology Office Note:    Date:  10/31/2022   ID:  Darius Mitchell, DOB 01/21/1951, MRN [id number]  PCP:  Wendling, Nicholas Paul, DO  Cardiologist:  Ayad Nieman, MD    Referring MD: Wendling, Nicholas Paul*   Chief Complaint  Patient presents with   Follow-up  Doing better  History of Present Illness:    Darius Mitchell is a 71 y.o. male past medical history significant for paroxysmal atrial fibrillation, he is being anticoagulated, he did have 1 cardioversion in the past, additional problem include dyslipidemia.  He came back to me again with episode of atrial fibrillation we started doing workup for antiarrhythmic therapy that workup included stress test which was negative done in March of this year as well as echocardiogram surprisingly, echocardiogram and stress test showed no evidence of ischemia but diminished ejection fraction.  Echocardiogram showed ejection fraction 35 to 40%, stress test 31%.  Obviously there was concerning.  I started guideline directed medical therapy, we do have difficulty getting him on appropriate medications.  He is taking Entresto only 2426 twice daily.  We had to cut down he is beta-blocker because of intolerance and bradycardia.  Overall he feels much better.  However he still in atrial fibrillation and he does have a visit today to talk about potential cardioversion.  He denies have any chest pain tightness squeezing pressure burning chest.  Past Medical History:  Diagnosis Date   Afib (HCC)    Anxiety and depression 03/25/2014   BMI 30.0-30.9,adult 05/15/2015   Carpal tunnel syndrome of right wrist 10/23/2015   Diverticulosis of colon without hemorrhage 03/17/2016   Essential hypertension, benign 03/25/2014   GERD (gastroesophageal reflux disease)    Hyperlipidemia    Hypothyroidism    Low testosterone 05/15/2015   Paroxysmal atrial fibrillation (HCC) 12/03/2018   Prostate cancer screening encounter, options and risks discussed 04/30/2014    Screening for ischemic heart disease 04/30/2014   Swelling of both lower extremities 11/29/2017   Visit for preventive health examination 04/30/2014    Past Surgical History:  Procedure Laterality Date   APPENDECTOMY  1971   WISDOM TOOTH EXTRACTION  1972    Current Medications: Current Meds  Medication Sig   amiodarone (PACERONE) 200 MG tablet Take 1 tablet (200 mg total) by mouth 2 (two) times daily.   apixaban (ELIQUIS) 5 MG TABS tablet Take 1 tablet by mouth 2 (two) times daily.   atorvastatin (LIPITOR) 80 MG tablet TAKE 1 TABLET BY MOUTH EVERY DAY   diltiazem (CARDIZEM) 30 MG tablet Take 1 tablet (30 mg total) by mouth 4 (four) times daily. For heart rate above 100 beats per minute in AFib. (Patient taking differently: Take 30 mg by mouth 4 (four) times daily as needed (afib).)   ezetimibe (ZETIA) 10 MG tablet TAKE 1 TABLET BY MOUTH EVERY DAY   furosemide (LASIX) 20 MG tablet Take 1 tablet (20 mg total) by mouth daily.   Glucosamine-Chondroit-Biofl-Mn (GLUCOSAMINE CHONDROIT,BIOFLAV,) CAPS Take 1 tablet by mouth in the morning.   levothyroxine (SYNTHROID) 125 MCG tablet TAKE 1 TABLET BY MOUTH ONCE DAILY BEFORE BREAKFAST   Multiple Vitamins-Minerals (CENTRUM SILVER ULTRA MENS) TABS Take 1 tablet by mouth in the morning.   omeprazole (PRILOSEC) 40 MG capsule TAKE 1 CAPSULE BY MOUTH EVERY DAY   PARoxetine (PAXIL) 30 MG tablet TAKE 1 TABLET BY MOUTH EVERY DAY   potassium chloride (KLOR-CON) 10 MEQ tablet Take 1 tablet (10 mEq total) by mouth daily.   sacubitril-valsartan (ENTRESTO) 24-26   MG Take 1 tablet by mouth 2 (two) times daily.   [DISCONTINUED] metoprolol succinate (TOPROL-XL) 25 MG 24 hr tablet TAKE 1 TABLET BY MOUTH ONCE DAILY     Allergies:   Codeine and Niacin   Social History   Socioeconomic History   Marital status: Married    Spouse name: Not on file   Number of children: Not on file   Years of education: Not on file   Highest education level: Not on file   Occupational History   Not on file  Tobacco Use   Smoking status: Former    Packs/day: 0.25    Years: 25.00    Additional pack years: 0.00    Total pack years: 6.25    Types: Cigarettes    Quit date: 05/28/2019    Years since quitting: 3.4   Smokeless tobacco: Never  Vaping Use   Vaping Use: Never used  Substance and Sexual Activity   Alcohol use: Never   Drug use: Never   Sexual activity: Not on file  Other Topics Concern   Not on file  Social History Narrative   Not on file   Social Determinants of Health   Financial Resource Strain: Not on file  Food Insecurity: Not on file  Transportation Needs: Not on file  Physical Activity: Not on file  Stress: Not on file  Social Connections: Not on file     Family History: The patient's family history includes Healthy in his brother, daughter, and sister; Heart attack in his mother; Heart disease in his father; Hypertension in his daughter; Lymphoma (age of onset: 86) in his father; Stomach cancer in his paternal grandfather; Stroke in his maternal grandmother; Stroke (age of onset: 89) in his mother. ROS:   Please see the history of present illness.    All 14 point review of systems negative except as described per history of present illness  EKGs/Labs/Other Studies Reviewed:    Stress test from September 20, 2022 showed:   No evidence of ischemia. The study is intermediate risk. Rhythm is atrial fibrillation.   Left ventricular function is abnormal. Global function is moderately reduced. Nuclear stress EF: 31 %. The left ventricular ejection fraction is moderately decreased (30-44%). End diastolic cavity size is moderately enlarged.  Echocardiogram from September 29, 2022 showed:  1. Atrial fibrillation. Left ventricular ejection fraction, by  estimation, is 35 to 40%. Left ventricular ejection fraction by 2D MOD  biplane is 40.6 %. The left ventricle has moderately decreased function.  The left ventricle demonstrates global   hypokinesis. There is mild concentric left ventricular hypertrophy. Left  ventricular diastolic parameters are indeterminate.   2. Right ventricular systolic function is moderately reduced. The right  ventricular size is normal. There is mildly elevated pulmonary artery  systolic pressure.   3. Left atrial size was severely dilated.   4. The mitral valve is degenerative. Moderate mitral valve regurgitation.  No evidence of mitral stenosis.   5. Tricuspid valve regurgitation is mild to moderate.   6. The aortic valve is tricuspid. Aortic valve regurgitation is mild.  Aortic valve sclerosis is present, with no evidence of aortic valve  stenosis.   7. Aortic Normal DTA. There is borderline dilatation of the ascending  aorta, measuring 38 mm.   8. The inferior vena cava is normal in size with greater than 50%  respiratory variability, suggesting right atrial pressure of 3 mmHg.    Recent Labs: 07/19/2022: ALT 20; TSH 2.42 09/12/2022: Magnesium 2.1 09/22/2022: NT-Pro   BNP 1,813 10/13/2022: B Natriuretic Peptide 339.3; BUN 19; Creatinine, Ser 1.09; Hemoglobin 13.2; Platelets 105; Potassium 4.2; Sodium 136  Recent Lipid Panel    Component Value Date/Time   CHOL 119 07/19/2022 1318   TRIG 83.0 07/19/2022 1318   HDL 44.40 07/19/2022 1318   CHOLHDL 3 07/19/2022 1318   VLDL 16.6 07/19/2022 1318   LDLCALC 58 07/19/2022 1318   LDLCALC 112 (H) 03/31/2020 0837   LDLDIRECT 94.0 10/24/2019 0733    Physical Exam:    VS:  BP 100/60 (BP Location: Left Arm, Patient Position: Sitting)   Pulse (!) 120   Ht 5' 11" (1.803 m)   Wt 203 lb (92.1 kg)   BMI 28.31 kg/m     Wt Readings from Last 3 Encounters:  10/31/22 203 lb (92.1 kg)  10/14/22 199 lb (90.3 kg)  10/13/22 204 lb 12.9 oz (92.9 kg)     GEN:  Well nourished, well developed in no acute distress HEENT: Normal NECK: No JVD; No carotid bruits LYMPHATICS: No lymphadenopathy CARDIAC: Irregularly irregular, no murmurs, no rubs, no  gallops RESPIRATORY:  Clear to auscultation without rales, wheezing or rhonchi  ABDOMEN: Soft, non-tender, non-distended MUSCULOSKELETAL:  No edema; No deformity  SKIN: Warm and dry LOWER EXTREMITIES: no swelling NEUROLOGIC:  Alert and oriented x 3 PSYCHIATRIC:  Normal affect   ASSESSMENT:    1. Paroxysmal atrial fibrillation (HCC)   2. Dilated cardiomyopathy (HCC)   3. Essential hypertension, benign   4. Persistent atrial fibrillation (HCC)   5. Mixed hyperlipidemia    PLAN:    In order of problems listed above:  Paroxysmal atrial fibrillation now persistent atrial fibrillation.  He is being anticoagulant without any interruption for weeks.  We initiated amiodarone 200 mg twice daily.  I will schedule him to have cardioversion after cardioversion if we maintain sinus rhythm we will be able to reduce the dose of this medication.  He does have already appointment with Dr. Camnitz electrophysiologist to talk about potentially atrial fibrillation ablation. Dilated cardiomyopathy could be related to arrhythmia and tachycardia, stress test negative in March and therefore unlikely to be ischemic in origin.  Trying to gradually put him on guideline directed medical therapy with some difficulty because of low blood pressure as well as bradycardia.  Overall feeling better and hopefully will convert into sinus rhythm and then recheck left ventricle ejection fraction. Essential hypertension: Dealing with opposite problem right now blood pressure being low. Mixed dyslipidemia he is on Zetia which I will continue I did review K PN which show me data from January 2024 LDL 58 HDL 44, will continue present management.   Medication Adjustments/Labs and Tests Ordered: Current medicines are reviewed at length with the patient today.  Concerns regarding medicines are outlined above.  Orders Placed This Encounter  Procedures   CBC   Basic metabolic panel   Medication changes: No orders of the defined  types were placed in this encounter.   Signed, Janila Arrazola J. Torry Adamczak, MD, FACC 10/31/2022 11:48 AM    [facility] 

## 2022-10-31 NOTE — H&P (View-Only) (Signed)
Cardiology Office Note:    Date:  10/31/2022   ID:  Darius Mitchell, DOB 02-19-1951, MRN [id number]  PCP:  [name], DO  Cardiologist:  Gypsy Balsam, MD    Referring MD: [name]*   Chief Complaint  Patient presents with   Follow-up  Doing better  History of Present Illness:    Darius Mitchell is a 72 y.o. male past medical history significant for paroxysmal atrial fibrillation, he is being anticoagulated, he did have 1 cardioversion in the past, additional problem include dyslipidemia.  He came back to me again with episode of atrial fibrillation we started doing workup for antiarrhythmic therapy that workup included stress test which was negative done in March of this year as well as echocardiogram surprisingly, echocardiogram and stress test showed no evidence of ischemia but diminished ejection fraction.  Echocardiogram showed ejection fraction 35 to 40%, stress test 31%.  Obviously there was concerning.  I started guideline directed medical therapy, we do have difficulty getting him on appropriate medications.  He is taking Entresto only 2426 twice daily.  We had to cut down he is beta-blocker because of intolerance and bradycardia.  Overall he feels much better.  However he still in atrial fibrillation and he does have a visit today to talk about potential cardioversion.  He denies have any chest pain tightness squeezing pressure burning chest.  Past Medical History:  Diagnosis Date   Afib (HCC)    Anxiety and depression 03/25/2014   BMI 30.0-30.9,adult 05/15/2015   Carpal tunnel syndrome of right wrist 10/23/2015   Diverticulosis of colon without hemorrhage 03/17/2016   Essential hypertension, benign 03/25/2014   GERD (gastroesophageal reflux disease)    Hyperlipidemia    Hypothyroidism    Low testosterone 05/15/2015   Paroxysmal atrial fibrillation (HCC) 12/03/2018   Prostate cancer screening encounter, options and risks discussed 04/30/2014    Screening for ischemic heart disease 04/30/2014   Swelling of both lower extremities 11/29/2017   Visit for preventive health examination 04/30/2014    Past Surgical History:  Procedure Laterality Date   APPENDECTOMY  1971   WISDOM TOOTH EXTRACTION  1972    Current Medications: Current Meds  Medication Sig   amiodarone (PACERONE) 200 MG tablet Take 1 tablet (200 mg total) by mouth 2 (two) times daily.   apixaban (ELIQUIS) 5 MG TABS tablet Take 1 tablet by mouth 2 (two) times daily.   atorvastatin (LIPITOR) 80 MG tablet TAKE 1 TABLET BY MOUTH EVERY DAY   diltiazem (CARDIZEM) 30 MG tablet Take 1 tablet (30 mg total) by mouth 4 (four) times daily. For heart rate above 100 beats per minute in AFib. (Patient taking differently: Take 30 mg by mouth 4 (four) times daily as needed (afib).)   ezetimibe (ZETIA) 10 MG tablet TAKE 1 TABLET BY MOUTH EVERY DAY   furosemide (LASIX) 20 MG tablet Take 1 tablet (20 mg total) by mouth daily.   Glucosamine-Chondroit-Biofl-Mn (GLUCOSAMINE CHONDROIT,BIOFLAV,) CAPS Take 1 tablet by mouth in the morning.   levothyroxine (SYNTHROID) 125 MCG tablet TAKE 1 TABLET BY MOUTH ONCE DAILY BEFORE BREAKFAST   Multiple Vitamins-Minerals (CENTRUM SILVER ULTRA MENS) TABS Take 1 tablet by mouth in the morning.   omeprazole (PRILOSEC) 40 MG capsule TAKE 1 CAPSULE BY MOUTH EVERY DAY   PARoxetine (PAXIL) 30 MG tablet TAKE 1 TABLET BY MOUTH EVERY DAY   potassium chloride (KLOR-CON) 10 MEQ tablet Take 1 tablet (10 mEq total) by mouth daily.   sacubitril-valsartan (ENTRESTO) 24-26  MG Take 1 tablet by mouth 2 (two) times daily.   [DISCONTINUED] metoprolol succinate (TOPROL-XL) 25 MG 24 hr tablet TAKE 1 TABLET BY MOUTH ONCE DAILY     Allergies:   Codeine and Niacin   Social History   Socioeconomic History   Marital status: Married    Spouse name: Not on file   Number of children: Not on file   Years of education: Not on file   Highest education level: Not on file   Occupational History   Not on file  Tobacco Use   Smoking status: Former    Packs/day: 0.25    Years: 25.00    Additional pack years: 0.00    Total pack years: 6.25    Types: Cigarettes    Quit date: 05/28/2019    Years since quitting: 3.4   Smokeless tobacco: Never  Vaping Use   Vaping Use: Never used  Substance and Sexual Activity   Alcohol use: Never   Drug use: Never   Sexual activity: Not on file  Other Topics Concern   Not on file  Social History Narrative   Not on file   Social Determinants of Health   Financial Resource Strain: Not on file  Food Insecurity: Not on file  Transportation Needs: Not on file  Physical Activity: Not on file  Stress: Not on file  Social Connections: Not on file     Family History: The patient's family history includes Healthy in his brother, daughter, and sister; Heart attack in his mother; Heart disease in his father; Hypertension in his daughter; Lymphoma (age of onset: 52) in his father; Stomach cancer in his paternal grandfather; Stroke in his maternal grandmother; Stroke (age of onset: 23) in his mother. ROS:   Please see the history of present illness.    All 14 point review of systems negative except as described per history of present illness  EKGs/Labs/Other Studies Reviewed:    Stress test from September 20, 2022 showed:   No evidence of ischemia. The study is intermediate risk. Rhythm is atrial fibrillation.   Left ventricular function is abnormal. Global function is moderately reduced. Nuclear stress EF: 31 %. The left ventricular ejection fraction is moderately decreased (30-44%). End diastolic cavity size is moderately enlarged.  Echocardiogram from September 29, 2022 showed:  1. Atrial fibrillation. Left ventricular ejection fraction, by  estimation, is 35 to 40%. Left ventricular ejection fraction by 2D MOD  biplane is 40.6 %. The left ventricle has moderately decreased function.  The left ventricle demonstrates global   hypokinesis. There is mild concentric left ventricular hypertrophy. Left  ventricular diastolic parameters are indeterminate.   2. Right ventricular systolic function is moderately reduced. The right  ventricular size is normal. There is mildly elevated pulmonary artery  systolic pressure.   3. Left atrial size was severely dilated.   4. The mitral valve is degenerative. Moderate mitral valve regurgitation.  No evidence of mitral stenosis.   5. Tricuspid valve regurgitation is mild to moderate.   6. The aortic valve is tricuspid. Aortic valve regurgitation is mild.  Aortic valve sclerosis is present, with no evidence of aortic valve  stenosis.   7. Aortic Normal DTA. There is borderline dilatation of the ascending  aorta, measuring 38 mm.   8. The inferior vena cava is normal in size with greater than 50%  respiratory variability, suggesting right atrial pressure of 3 mmHg.    Recent Labs: 07/19/2022: ALT 20; TSH 2.42 09/12/2022: Magnesium 2.1 09/22/2022: NT-Pro  BNP 1,813 10/13/2022: B Natriuretic Peptide 339.3; BUN 19; Creatinine, Ser 1.09; Hemoglobin 13.2; Platelets 105; Potassium 4.2; Sodium 136  Recent Lipid Panel    Component Value Date/Time   CHOL 119 07/19/2022 1318   TRIG 83.0 07/19/2022 1318   HDL 44.40 07/19/2022 1318   CHOLHDL 3 07/19/2022 1318   VLDL 16.6 07/19/2022 1318   LDLCALC 58 07/19/2022 1318   LDLCALC 112 (H) 03/31/2020 0837   LDLDIRECT 94.0 10/24/2019 0733    Physical Exam:    VS:  BP 100/60 (BP Location: Left Arm, Patient Position: Sitting)   Pulse (!) 120   Ht 5\' 11"  (1.803 m)   Wt 203 lb (92.1 kg)   BMI 28.31 kg/m     Wt Readings from Last 3 Encounters:  10/31/22 203 lb (92.1 kg)  10/14/22 199 lb (90.3 kg)  10/13/22 204 lb 12.9 oz (92.9 kg)     GEN:  Well nourished, well developed in no acute distress HEENT: Normal NECK: No JVD; No carotid bruits LYMPHATICS: No lymphadenopathy CARDIAC: Irregularly irregular, no murmurs, no rubs, no  gallops RESPIRATORY:  Clear to auscultation without rales, wheezing or rhonchi  ABDOMEN: Soft, non-tender, non-distended MUSCULOSKELETAL:  No edema; No deformity  SKIN: Warm and dry LOWER EXTREMITIES: no swelling NEUROLOGIC:  Alert and oriented x 3 PSYCHIATRIC:  Normal affect   ASSESSMENT:    1. Paroxysmal atrial fibrillation (HCC)   2. Dilated cardiomyopathy (HCC)   3. Essential hypertension, benign   4. Persistent atrial fibrillation (HCC)   5. Mixed hyperlipidemia    PLAN:    In order of problems listed above:  Paroxysmal atrial fibrillation now persistent atrial fibrillation.  He is being anticoagulant without any interruption for weeks.  We initiated amiodarone 200 mg twice daily.  I will schedule him to have cardioversion after cardioversion if we maintain sinus rhythm we will be able to reduce the dose of this medication.  He does have already appointment with Dr. Elberta Fortis electrophysiologist to talk about potentially atrial fibrillation ablation. Dilated cardiomyopathy could be related to arrhythmia and tachycardia, stress test negative in March and therefore unlikely to be ischemic in origin.  Trying to gradually put him on guideline directed medical therapy with some difficulty because of low blood pressure as well as bradycardia.  Overall feeling better and hopefully will convert into sinus rhythm and then recheck left ventricle ejection fraction. Essential hypertension: Dealing with opposite problem right now blood pressure being low. Mixed dyslipidemia he is on Zetia which I will continue I did review K PN which show me data from January 2024 LDL 58 HDL 44, will continue present management.   Medication Adjustments/Labs and Tests Ordered: Current medicines are reviewed at length with the patient today.  Concerns regarding medicines are outlined above.  Orders Placed This Encounter  Procedures   CBC   Basic metabolic panel   Medication changes: No orders of the defined  types were placed in this encounter.   Signed, Georgeanna Lea, MD, [facility] 10/31/2022 11:48 AM    [facility]

## 2022-11-01 LAB — BASIC METABOLIC PANEL
BUN/Creatinine Ratio: 12 (ref 10–24)
BUN: 17 mg/dL (ref 8–27)
CO2: 23 mmol/L (ref 20–29)
Calcium: 8.9 mg/dL (ref 8.6–10.2)
Chloride: 102 mmol/L (ref 96–106)
Creatinine, Ser: 1.41 mg/dL — ABNORMAL HIGH (ref 0.76–1.27)
Glucose: 87 mg/dL (ref 70–99)
Potassium: 3.8 mmol/L (ref 3.5–5.2)
Sodium: 140 mmol/L (ref 134–144)
eGFR: 53 mL/min/{1.73_m2} — ABNORMAL LOW (ref >59)

## 2022-11-01 LAB — CBC
Hematocrit: 38.1 % (ref 37.5–51.0)
Hemoglobin: 12.6 g/dL — ABNORMAL LOW (ref 13.0–17.7)
MCH: 30.7 pg (ref 26.6–33.0)
MCHC: 33.1 g/dL (ref 31.5–35.7)
MCV: 93 fL (ref 79–97)
Platelets: 120 10*3/uL — ABNORMAL LOW (ref 150–450)
RBC: 4.11 x10E6/uL — ABNORMAL LOW (ref 4.14–5.80)
RDW: 12.8 % (ref 11.6–15.4)
WBC: 6.1 10*3/uL (ref 3.4–10.8)

## 2022-11-01 NOTE — Pre-Procedure Instructions (Signed)
Spoke with Darius Mitchell reviewed the following information with patient.  Arrival time 8:00 on Wednesday 11/02/22.  Nothing to eat or drink after midnight.  He has a responsible person to drive him home and stay with him for 24 hours.  He takes Eliquis twice a day, he hasn't missed any doses.  He will take dose in the morning as well as his BP medications.

## 2022-11-02 ENCOUNTER — Encounter: Payer: Self-pay | Admitting: Cardiology

## 2022-11-02 ENCOUNTER — Ambulatory Visit (HOSPITAL_COMMUNITY): Payer: Medicare Other | Admitting: Anesthesiology

## 2022-11-02 ENCOUNTER — Encounter (HOSPITAL_COMMUNITY)
Admission: RE | Disposition: A | Discharge status: Home / Self Care | Payer: Self-pay | Source: Home / Self Care | Attending: Cardiology

## 2022-11-02 ENCOUNTER — Other Ambulatory Visit: Payer: Self-pay | Admitting: Cardiology

## 2022-11-02 ENCOUNTER — Ambulatory Visit (HOSPITAL_COMMUNITY)
Admission: RE | Admit: 2022-11-02 | Discharge: 2022-11-02 | Disposition: A | Discharge status: Home / Self Care | Payer: Medicare Other | Attending: Cardiology | Admitting: Cardiology

## 2022-11-02 ENCOUNTER — Encounter (HOSPITAL_COMMUNITY): Payer: Self-pay | Admitting: Cardiology

## 2022-11-02 ENCOUNTER — Other Ambulatory Visit (HOSPITAL_BASED_OUTPATIENT_CLINIC_OR_DEPARTMENT_OTHER): Payer: Self-pay

## 2022-11-02 ENCOUNTER — Other Ambulatory Visit: Payer: Self-pay

## 2022-11-02 ENCOUNTER — Ambulatory Visit (HOSPITAL_BASED_OUTPATIENT_CLINIC_OR_DEPARTMENT_OTHER): Payer: Medicare Other | Admitting: Anesthesiology

## 2022-11-02 DIAGNOSIS — I4891 Unspecified atrial fibrillation: Secondary | ICD-10-CM | POA: Diagnosis not present

## 2022-11-02 DIAGNOSIS — I119 Hypertensive heart disease without heart failure: Secondary | ICD-10-CM | POA: Diagnosis not present

## 2022-11-02 DIAGNOSIS — Z79899 Other long term (current) drug therapy: Secondary | ICD-10-CM | POA: Diagnosis not present

## 2022-11-02 DIAGNOSIS — I480 Paroxysmal atrial fibrillation: Secondary | ICD-10-CM

## 2022-11-02 DIAGNOSIS — I4819 Other persistent atrial fibrillation: Secondary | ICD-10-CM | POA: Insufficient documentation

## 2022-11-02 DIAGNOSIS — E782 Mixed hyperlipidemia: Secondary | ICD-10-CM | POA: Diagnosis not present

## 2022-11-02 DIAGNOSIS — I420 Dilated cardiomyopathy: Secondary | ICD-10-CM | POA: Diagnosis not present

## 2022-11-02 DIAGNOSIS — Z87891 Personal history of nicotine dependence: Secondary | ICD-10-CM | POA: Diagnosis not present

## 2022-11-02 DIAGNOSIS — E039 Hypothyroidism, unspecified: Secondary | ICD-10-CM

## 2022-11-02 DIAGNOSIS — Z7901 Long term (current) use of anticoagulants: Secondary | ICD-10-CM | POA: Diagnosis not present

## 2022-11-02 DIAGNOSIS — I10 Essential (primary) hypertension: Secondary | ICD-10-CM | POA: Diagnosis not present

## 2022-11-02 HISTORY — PX: CARDIOVERSION: SHX1299

## 2022-11-02 SURGERY — CARDIOVERSION
Anesthesia: General

## 2022-11-02 MED ORDER — APIXABAN 5 MG PO TABS
ORAL_TABLET | Freq: Two times a day (BID) | ORAL | 1 refills | Status: DC
  Filled 2022-11-02: qty 180, 90d supply, fill #0
  Filled 2022-11-18 – 2023-01-29 (×3): qty 180, 90d supply, fill #1

## 2022-11-02 MED ORDER — PROPOFOL 10 MG/ML IV BOLUS
INTRAVENOUS | Status: DC | PRN
  Administered 2022-11-02: 50 mg via INTRAVENOUS
  Administered 2022-11-02: 20 mg via INTRAVENOUS

## 2022-11-02 MED ORDER — LIDOCAINE 2% (20 MG/ML) 5 ML SYRINGE
INTRAMUSCULAR | Status: DC | PRN
  Administered 2022-11-02: 40 mg via INTRAVENOUS

## 2022-11-02 MED ORDER — SODIUM CHLORIDE 0.9 % IV SOLN: INTRAVENOUS | Status: DC

## 2022-11-02 SURGICAL SUPPLY — 1 items: ELECT DEFIB PAD ADLT CADENCE (PAD) ×1 IMPLANT

## 2022-11-02 NOTE — Transfer of Care (Signed)
Immediate Anesthesia Transfer of Care Note  Patient: Darius Mitchell  Procedure(s) Performed: CARDIOVERSION  Patient Location: PACU and Cath Lab  Anesthesia Type:MAC  Level of Consciousness: drowsy  Airway & Oxygen Therapy: Patient Spontanous Breathing and Patient connected to nasal cannula oxygen  Post-op Assessment: Report given to RN and Post -op Vital signs reviewed and stable  Post vital signs: Reviewed and stable  Last Vitals:  Vitals Value Taken Time  BP    Temp    Pulse    Resp    SpO2      Last Pain:  Vitals:   11/02/22 0758  TempSrc: Temporal  PainSc: 0-No pain         Complications: No notable events documented.

## 2022-11-02 NOTE — Anesthesia Postprocedure Evaluation (Signed)
Anesthesia Post Note  Patient: Darius Mitchell  Procedure(s) Performed: CARDIOVERSION     Patient location during evaluation: PACU Anesthesia Type: General Level of consciousness: awake and alert Pain management: pain level controlled Vital Signs Assessment: post-procedure vital signs reviewed and stable Respiratory status: spontaneous breathing, nonlabored ventilation, respiratory function stable and patient connected to nasal cannula oxygen Cardiovascular status: blood pressure returned to baseline and stable Postop Assessment: no apparent nausea or vomiting Anesthetic complications: no  No notable events documented.  Last Vitals:  Vitals:   11/02/22 0900 11/02/22 0922  BP: (!) 122/103 104/83  Pulse: (!) 109 68  Resp: (!) 24 (!) 7  Temp: 36.7 C   SpO2: 91% 98%    Last Pain:  Vitals:   11/02/22 0758  TempSrc: Temporal  PainSc: 0-No pain                 Shelton Silvas

## 2022-11-02 NOTE — CV Procedure (Signed)
   Electrical Cardioversion Procedure Note Darius Mitchell [id number] [birth year]  Procedure: Electrical Cardioversion Indications:  Atrial Fibrillation  Time Out: Verified patient identification, verified procedure,medications/allergies/relevent history reviewed, required imaging and test results available.  Performed  Procedure Details  The patient signed informed consent.   The patient was NPO past midnight. Has had therapeutic anticoagulation with Eliquis greater than 3 weeks. The patient denies any interruption of anticoagulation.  Anesthesia was administered by Dr. Hart Rochester.  Adequate airway was maintained throughout and vital followed per protocol.  He was cardioverted x 1 with 200J of biphasic synchronized energy.  He converted to NSR.  There were no apparent complications.  The patient tolerated the procedure well and had normal neuro status and respiratory status post procedure with vitals stable as recorded elsewhere.     IMPRESSION:  Successful cardioversion of atrial fibrillation to sinus rhythm.   Follow up:  We will arrange follow up with Dr. Bing Matter.  He will continue on current medical therapy.  The patient advised to continue anticoagulation.  Darius Mitchell 11/02/2022, 9:15 AM

## 2022-11-02 NOTE — Telephone Encounter (Signed)
Prescription refill request for Eliquis received. Indication: PAF Last office visit: 10/31/22  Kandyce Rud MD Scr: 1.41 on 10/31/22  Epic Age: 72 Weight: 92.1kg  Based on above findings Eliquis 5mg  twice daily is the appropriate dose.  Refill approved.

## 2022-11-02 NOTE — Interval H&P Note (Signed)
History and Physical Interval Note:  11/02/2022 8:07 AM  Darius Mitchell  has presented today for surgery, with the diagnosis of afib.  The various methods of treatment have been discussed with the patient and family. After consideration of risks, benefits and other options for treatment, the patient has consented to  Procedure(s): CARDIOVERSION (N/A) as a surgical intervention.  The patient's history has been reviewed, patient examined, no change in status, stable for surgery.  I have reviewed the patient's chart and labs.  Questions were answered to the patient's satisfaction.     Tashae Inda

## 2022-11-02 NOTE — Anesthesia Preprocedure Evaluation (Addendum)
Anesthesia Evaluation  Patient identified by MRN, date of birth, ID band Patient awake    Reviewed: Allergy & Precautions, NPO status , Patient's Chart, lab work & pertinent test results  Airway Mallampati: I  TM Distance: >3 FB Neck ROM: Full    Dental  (+) Teeth Intact, Dental Advisory Given   Pulmonary former smoker   breath sounds clear to auscultation       Cardiovascular hypertension, Pt. on medications + dysrhythmias Atrial Fibrillation  Rhythm:Irregular Rate:Abnormal  Echo:  1. Atrial fibrillation. Left ventricular ejection fraction, by  estimation, is 35 to 40%. Left ventricular ejection fraction by 2D MOD  biplane is 40.6 %. The left ventricle has moderately decreased function.  The left ventricle demonstrates global  hypokinesis. There is mild concentric left ventricular hypertrophy. Left  ventricular diastolic parameters are indeterminate.   2. Right ventricular systolic function is moderately reduced. The right  ventricular size is normal. There is mildly elevated pulmonary artery  systolic pressure.   3. Left atrial size was severely dilated.   4. The mitral valve is degenerative. Moderate mitral valve regurgitation.  No evidence of mitral stenosis.   5. Tricuspid valve regurgitation is mild to moderate.   6. The aortic valve is tricuspid. Aortic valve regurgitation is mild.  Aortic valve sclerosis is present, with no evidence of aortic valve  stenosis.   7. Aortic Normal DTA. There is borderline dilatation of the ascending  aorta, measuring 38 mm.   8. The inferior vena cava is normal in size with greater than 50%  respiratory variability, suggesting right atrial pressure of 3 mmHg.     Neuro/Psych  PSYCHIATRIC DISORDERS Anxiety Depression     Neuromuscular disease    GI/Hepatic Neg liver ROS,GERD  Medicated,,  Endo/Other  Hypothyroidism    Renal/GU negative Renal ROS     Musculoskeletal negative  musculoskeletal ROS (+)    Abdominal   Peds  Hematology negative hematology ROS (+)   Anesthesia Other Findings   Reproductive/Obstetrics                             Anesthesia Physical Anesthesia Plan  ASA: 3  Anesthesia Plan: General   Post-op Pain Management: Minimal or no pain anticipated   Induction: Intravenous  PONV Risk Score and Plan: Propofol infusion  Airway Management Planned: Natural Airway  Additional Equipment: None  Intra-op Plan:   Post-operative Plan:   Informed Consent: I have reviewed the patients History and Physical, chart, labs and discussed the procedure including the risks, benefits and alternatives for the proposed anesthesia with the patient or authorized representative who has indicated his/her understanding and acceptance.       Plan Discussed with: CRNA  Anesthesia Plan Comments:        Anesthesia Quick Evaluation

## 2022-11-03 ENCOUNTER — Other Ambulatory Visit (HOSPITAL_BASED_OUTPATIENT_CLINIC_OR_DEPARTMENT_OTHER): Payer: Self-pay

## 2022-11-03 ENCOUNTER — Telehealth: Payer: Self-pay | Admitting: Cardiology

## 2022-11-03 NOTE — Telephone Encounter (Signed)
Patient c/o Palpitations:  High priority if patient c/o lightheadedness, shortness of breath, or chest pain  How long have you had palpitations/irregular HR/ Afib? A few hours after he left the hospital. Are you having the symptoms now? Yes   Are you currently experiencing lightheadedness, SOB or CP? No   Do you have a history of afib (atrial fibrillation) or irregular heart rhythm? Yes   Have you checked your BP or HR? (document readings if available): He said his iWatch shows his HR has been between 85-140   Are you experiencing any other symptoms? Patient states he is having no other symptoms.  He states he had a cardioversion done yesterday with Dr. Servando Salina and shortly after he left the hospital he went back into A-fib.

## 2022-11-03 NOTE — Telephone Encounter (Signed)
Called patient and he reported that he had a cardioversion yesterday because he had been in A-fib and after the cardioversion he was in SR. Later on during the day he began feeling like he was back in A-fib and his  iwatch started giving him warning's saying that he was back in A-fib and his heart rate was ranging from 85 - 140. He was also a little SOB with exertion. Spoke with Dr. Bing Matter and he recommended that he come to the office for an EKG to confirm that he was in A-fib. Then he would decide what to do after his EKG. I relayed this information to the patient and a nurse visit was scheduled for him to have an EKG at 10 am on 11/04/22. Patient verbalized understanding and had no further questions at this time.

## 2022-11-04 ENCOUNTER — Ambulatory Visit: Payer: Medicare Other | Attending: Cardiology

## 2022-11-04 ENCOUNTER — Other Ambulatory Visit: Payer: Self-pay

## 2022-11-04 DIAGNOSIS — I480 Paroxysmal atrial fibrillation: Secondary | ICD-10-CM

## 2022-11-04 NOTE — Progress Notes (Signed)
   Nurse Visit   Date of Encounter: 11/04/2022 ID: Darius Mitchell, DOB [birth year], MRN [id number]  PCP:  [name], DO   [name] Providers Cardiologist:  None      Visit Details   VS:  BP 92/64 (BP Location: Right Arm, Patient Position: Sitting, Cuff Size: Normal)   Pulse 79  , BMI There is no height or weight on file to calculate BMI.  Wt Readings from Last 3 Encounters:  11/02/22 194 lb (88 kg)  10/31/22 203 lb (92.1 kg)  10/14/22 199 lb (90.3 kg)     Reason for visit: Perform EKG Performed today: EKG, Vitals, Education and Provider consulted Changes (medications, testing, etc.) : No new orders. Patient is to keep his next appointment with Dr. Elberta Fortis Length of Visit: 25 minutes    Medications Adjustments/Labs and Tests Ordered: No orders of the defined types were placed in this encounter.  No orders of the defined types were placed in this encounter.    Signed, Samson Frederic, RN  11/04/2022 1:23 PM

## 2022-11-07 ENCOUNTER — Ambulatory Visit: Payer: Medicare Other | Admitting: Cardiology

## 2022-11-09 ENCOUNTER — Ambulatory Visit: Payer: Medicare Other | Admitting: Cardiology

## 2022-11-09 ENCOUNTER — Ambulatory Visit (INDEPENDENT_AMBULATORY_CARE_PROVIDER_SITE_OTHER): Payer: Medicare Other | Admitting: Family Medicine

## 2022-11-09 ENCOUNTER — Ambulatory Visit (HOSPITAL_BASED_OUTPATIENT_CLINIC_OR_DEPARTMENT_OTHER)
Admission: RE | Admit: 2022-11-09 | Discharge: 2022-11-09 | Disposition: A | Discharge status: Home / Self Care | Payer: Medicare Other | Source: Ambulatory Visit | Attending: Family Medicine | Admitting: Family Medicine

## 2022-11-09 ENCOUNTER — Encounter: Payer: Self-pay | Admitting: Family Medicine

## 2022-11-09 VITALS — BP 110/72 | HR 110 | Temp 97.9°F | Ht 71.0 in | Wt 205.1 lb

## 2022-11-09 DIAGNOSIS — R0600 Dyspnea, unspecified: Secondary | ICD-10-CM | POA: Diagnosis not present

## 2022-11-09 DIAGNOSIS — R0609 Other forms of dyspnea: Secondary | ICD-10-CM | POA: Insufficient documentation

## 2022-11-09 DIAGNOSIS — R0789 Other chest pain: Secondary | ICD-10-CM | POA: Diagnosis not present

## 2022-11-09 NOTE — Progress Notes (Signed)
Chief Complaint  Patient presents with   followup cardiology appt    Chest heaviness     Subjective: Patient is a 72 y.o. male here for chest tightness/pressure.  He is here with his spouse.  Started happening 3 weeks ago. Had cardioversion 5/8 and it lasted for 2 hrs before resuming in A fib.  Going up inclines makes him SOB.  He has had several ER visits, chest x-ray showing fluid buildup.  He is currently taking Lasix 20 mg daily in the morning.  He has some swelling in his lower extremities.  He is not coughing.  It takes him around 20 to 30 minutes after his Lasix dose to urinate.  He is clearing his throat more often.  Sometimes he has a pressure in his chest.  Recent EKGs were unremarkable for ischemia and he has been in close contact with his heart team.  Eating seems to improve things.  He is on omeprazole 40 mg daily and reports compliance.   Past Medical History:  Diagnosis Date   Afib (HCC)    Anxiety and depression 03/25/2014   BMI 30.0-30.9,adult 05/15/2015   Carpal tunnel syndrome of right wrist 10/23/2015   Diverticulosis of colon without hemorrhage 03/17/2016   Essential hypertension, benign 03/25/2014   GERD (gastroesophageal reflux disease)    Hyperlipidemia    Hypothyroidism    Low testosterone 05/15/2015   Paroxysmal atrial fibrillation (HCC) 12/03/2018   Prostate cancer screening encounter, options and risks discussed 04/30/2014   Screening for ischemic heart disease 04/30/2014   Swelling of both lower extremities 11/29/2017   Visit for preventive health examination 04/30/2014    Objective: BP 110/72 (BP Location: Left Arm, Patient Position: Sitting, Cuff Size: Normal)   Pulse (!) 110   Temp 97.9 F (36.6 C) (Oral)   Ht 5\' 11"  (1.803 m)   Wt 205 lb 2 oz (93 kg)   SpO2 99%   BMI 28.61 kg/m  General: Awake, appears stated age Heart: Regular rate, irregularly irregular rhythm, 2+ pitting bilateral LE edema tapering at the proximal third of the  tibia Mouth: MMM Lungs: CTAB, no rales, wheezes or rhonchi. No accessory muscle use Psych: Age appropriate judgment and insight, normal affect and mood  Assessment and Plan: DOE (dyspnea on exertion) - Plan: DG Chest 2 View  Chest pressure   Check a chest x-ray.  Continue Lasix 20 mg daily for now.  May have to increase to twice daily dosing if there is still fluid present.  Has an appointment with the EP next week for possible ablation.  Would continue medicines as outlined by his cardiology team in the meanwhile.  Stay hydrated.  He will increase his omeprazole dosage from 40 mg daily to twice daily.  I will see him as originally scheduled or as needed. The patient and his spouse voiced understanding and agreement to the plan.  I spent 35 minutes with the patient discussing the above plans in addition to reviewing his chart on the same day of the visit.  Jilda Roche [place], DO 11/09/22  3:02 PM

## 2022-11-09 NOTE — Patient Instructions (Addendum)
We will be in touch regarding our X-ray results.   Stay hydrated.  Take the omeprazole twice daily to see if this helps.   Let us know if you need anything.

## 2022-11-16 ENCOUNTER — Other Ambulatory Visit: Payer: Self-pay | Admitting: Family Medicine

## 2022-11-16 ENCOUNTER — Ambulatory Visit: Payer: Medicare Other | Attending: Cardiology | Admitting: Cardiology

## 2022-11-16 ENCOUNTER — Encounter: Payer: Self-pay | Admitting: Family Medicine

## 2022-11-16 ENCOUNTER — Encounter: Payer: Self-pay | Admitting: Cardiology

## 2022-11-16 ENCOUNTER — Other Ambulatory Visit (HOSPITAL_BASED_OUTPATIENT_CLINIC_OR_DEPARTMENT_OTHER): Payer: Self-pay

## 2022-11-16 VITALS — BP 114/78 | HR 96 | Ht 71.0 in | Wt 206.2 lb

## 2022-11-16 DIAGNOSIS — I4819 Other persistent atrial fibrillation: Secondary | ICD-10-CM

## 2022-11-16 DIAGNOSIS — I5022 Chronic systolic (congestive) heart failure: Secondary | ICD-10-CM

## 2022-11-16 DIAGNOSIS — I10 Essential (primary) hypertension: Secondary | ICD-10-CM

## 2022-11-16 DIAGNOSIS — D6869 Other thrombophilia: Secondary | ICD-10-CM

## 2022-11-16 MED ORDER — BENZONATATE 200 MG PO CAPS: 200.0000 mg | ORAL_CAPSULE | Freq: Two times a day (BID) | ORAL | 0 refills | Status: DC | PRN

## 2022-11-16 NOTE — Progress Notes (Signed)
Electrophysiology Office Note   Date:  11/16/2022   ID:  Darius Mitchell, DOB 29-Jun-1950, MRN [id number]  PCP:  [name], DO  Cardiologist:  Bing Matter Primary Electrophysiologist:  Yashika Mask Jorja Loa, MD    Chief Complaint: AF   History of Present Illness: Darius Mitchell is a 72 y.o. male who is being seen today for the evaluation of AF at the request of Darius Lea, MD. Presenting today for electrophysiology evaluation.  He has a history significant for atrial fibrillation, hyperlipidemia.  He presented to cardiology clinic in atrial fibrillation feeling quite poorly.  He had an echo that showed an ejection fraction of 35 to 40%.  He is post cardioversion 11/02/2022.  Today, he denies symptoms of palpitations, chest pain, orthopnea, PND, lower extremity edema, claudication, dizziness, presyncope, syncope, bleeding, or neurologic sequela. The patient is tolerating medications without difficulties.    Past Medical History:  Diagnosis Date   Afib (HCC)    Anxiety and depression 03/25/2014   BMI 30.0-30.9,adult 05/15/2015   Carpal tunnel syndrome of right wrist 10/23/2015   Diverticulosis of colon without hemorrhage 03/17/2016   Essential hypertension, benign 03/25/2014   GERD (gastroesophageal reflux disease)    Hyperlipidemia    Hypothyroidism    Low testosterone 05/15/2015   Paroxysmal atrial fibrillation (HCC) 12/03/2018   Prostate cancer screening encounter, options and risks discussed 04/30/2014   Screening for ischemic heart disease 04/30/2014   Swelling of both lower extremities 11/29/2017   Visit for preventive health examination 04/30/2014   Past Surgical History:  Procedure Laterality Date   APPENDECTOMY  1971   CARDIOVERSION N/A 11/02/2022   Procedure: CARDIOVERSION;  Surgeon: Thomasene Ripple, DO;  Location: [facility] IN[facility];  Service: Cardiovascular;  Laterality: N/A;   WISDOM TOOTH EXTRACTION  1972     Current Outpatient Medications   Medication Sig Dispense Refill   amiodarone (PACERONE) 200 MG tablet Take 1 tablet (200 mg total) by mouth 2 (two) times daily. 60 tablet 3   apixaban (ELIQUIS) 5 MG TABS tablet Take 1 tablet by mouth 2 (two) times daily. 180 tablet 1   atorvastatin (LIPITOR) 80 MG tablet TAKE 1 TABLET BY MOUTH EVERY DAY 90 tablet 3   ezetimibe (ZETIA) 10 MG tablet TAKE 1 TABLET BY MOUTH EVERY DAY 90 tablet 3   furosemide (LASIX) 20 MG tablet Take 1 tablet (20 mg total) by mouth daily. 90 tablet 3   Glucosamine-Chondroit-Biofl-Mn (GLUCOSAMINE CHONDROIT,BIOFLAV,) CAPS Take 1 tablet by mouth in the morning.     levothyroxine (SYNTHROID) 125 MCG tablet TAKE 1 TABLET BY MOUTH ONCE DAILY BEFORE BREAKFAST 90 tablet 1   Multiple Vitamins-Minerals (CENTRUM SILVER ULTRA MENS) TABS Take 1 tablet by mouth in the morning.     Omega-3 Fatty Acids (OMEGA 3 PO) Take 1,600 mg by mouth daily.     omeprazole (PRILOSEC) 40 MG capsule TAKE 1 CAPSULE BY MOUTH EVERY DAY 90 capsule 3   PARoxetine (PAXIL) 30 MG tablet TAKE 1 TABLET BY MOUTH EVERY DAY 90 tablet 3   potassium chloride (KLOR-CON) 10 MEQ tablet Take 1 tablet (10 mEq total) by mouth daily. 90 tablet 3   sacubitril-valsartan (ENTRESTO) 24-26 MG Take 1 tablet by mouth 2 (two) times daily. 60 tablet 6   diltiazem (CARDIZEM) 30 MG tablet Take 1 tablet (30 mg total) by mouth 4 (four) times daily. For heart rate above 100 beats per minute in AFib. (Patient taking differently: Take 30 mg by mouth 4 (four) times daily  as needed (afib).) 120 tablet 0   No current facility-administered medications for this visit.    Allergies:   Codeine and Niacin   Social History:  The patient  reports that he quit smoking about 3 years ago. His smoking use included cigarettes. He has a 6.25 pack-year smoking history. He has never used smokeless tobacco. He reports that he does not drink alcohol and does not use drugs.   Family History:  The patient's family history includes Healthy in his  brother, daughter, and sister; Heart attack in his mother; Heart disease in his father; Hypertension in his daughter; Lymphoma (age of onset: 40) in his father; Stomach cancer in his paternal grandfather; Stroke in his maternal grandmother; Stroke (age of onset: 83) in his mother.    ROS:  Please see the history of present illness.   Otherwise, review of systems is positive for none.   All other systems are reviewed and negative.    PHYSICAL EXAM: VS:  BP 114/78   Pulse 96   Ht 5\' 11"  (1.803 m)   Wt 206 lb 3.2 oz (93.5 kg)   SpO2 98%   BMI 28.76 kg/m  , BMI Body mass index is 28.76 kg/m. GEN: Well nourished, well developed, in no acute distress  HEENT: normal  Neck: no JVD, carotid bruits, or masses Cardiac: Irregular; no murmurs, rubs, or gallops,no edema  Respiratory:  clear to auscultation bilaterally, normal work of breathing GI: soft, nontender, nondistended, + BS MS: no deformity or atrophy  Skin: warm and dry Neuro:  Strength and sensation are intact Psych: euthymic mood, full affect  EKG:  EKG is ordered today. Personal review of the ekg ordered shows atrial fibrillation  Recent Labs: 07/19/2022: ALT 20; TSH 2.42 09/12/2022: Magnesium 2.1 09/22/2022: NT-Pro BNP 1,813 10/13/2022: B Natriuretic Peptide 339.3 10/31/2022: BUN 17; Creatinine, Ser 1.41; Hemoglobin 12.6; Platelets 120; Potassium 3.8; Sodium 140    Lipid Panel     Component Value Date/Time   CHOL 119 07/19/2022 1318   TRIG 83.0 07/19/2022 1318   HDL 44.40 07/19/2022 1318   CHOLHDL 3 07/19/2022 1318   VLDL 16.6 07/19/2022 1318   LDLCALC 58 07/19/2022 1318   LDLCALC 112 (H) 03/31/2020 0837   LDLDIRECT 94.0 10/24/2019 0733     Wt Readings from Last 3 Encounters:  11/16/22 206 lb 3.2 oz (93.5 kg)  11/09/22 205 lb 2 oz (93 kg)  11/02/22 194 lb (88 kg)      Other studies Reviewed: Additional studies/ records that were reviewed today include: TTE 09/29/22  Review of the above records today demonstrates:    1. Atrial fibrillation. Left ventricular ejection fraction, by  estimation, is 35 to 40%. Left ventricular ejection fraction by 2D MOD  biplane is 40.6 %. The left ventricle has moderately decreased function.  The left ventricle demonstrates global  hypokinesis. There is mild concentric left ventricular hypertrophy. Left  ventricular diastolic parameters are indeterminate.   2. Right ventricular systolic function is moderately reduced. The right  ventricular size is normal. There is mildly elevated pulmonary artery  systolic pressure.   3. Left atrial size was severely dilated.   4. The mitral valve is degenerative. Moderate mitral valve regurgitation.  No evidence of mitral stenosis.   5. Tricuspid valve regurgitation is mild to moderate.   6. The aortic valve is tricuspid. Aortic valve regurgitation is mild.  Aortic valve sclerosis is present, with no evidence of aortic valve  stenosis.   7. Aortic Normal DTA. There  is borderline dilatation of the ascending  aorta, measuring 38 mm.   8. The inferior vena cava is normal in size with greater than 50%  respiratory variability, suggesting right atrial pressure of 3 mmHg.   Myoview 09/22/2022   No evidence of ischemia. The study is intermediate risk. Rhythm is atrial fibrillation.   Left ventricular function is abnormal. Global function is moderately reduced. Nuclear stress EF: 31 %. The left ventricular ejection fraction is moderately decreased (30-44%). End diastolic cavity size is moderately enlarged.  ASSESSMENT AND PLAN:  1.  Persistent atrial fibrillation: Currently on amiodarone and Eliquis.  CHA2DS2-VASc of 2.  He unfortunately is back in atrial fibrillation.  He feels quite poorly.  He would prefer an alternative rhythm control strategy to long-term amiodarone.  Due to that, we Dazja Houchin plan for ablation.  Risk and benefits have been discussed.  He understands the risks and is agreed to the procedure.  In the interim, he is taking  amiodarone twice a day.  Aretha Levi plan for repeat cardioversion.  Michail Boyte continue amiodarone 200 mg twice daily for 1 month and then down to 200 mg daily.  Risk, benefits, and alternatives to EP study and radiofrequency ablation for afib were also discussed in detail today. These risks include but are not limited to stroke, bleeding, vascular damage, tamponade, perforation, damage to the esophagus, lungs, and other structures, pulmonary vein stenosis, worsening renal function, and death. The patient understands these risk and wishes to proceed.  We Sharmila Wrobleski therefore proceed with catheter ablation at the next available time.  Carto, ICE, anesthesia are requested for the procedure.  Zeta Bucy also obtain CT PV protocol prior to the procedure to exclude LAA thrombus and further evaluate atrial anatomy.   2.  Chronic systolic heart failure: Potentially related to a tachycardia mediated cardiomyopathy.  Maintenance of sinus rhythm may be essential.  Medication management per primary cardiology.  3.  Hypertension: Currently well-controlled  4.  Secondary hypercoagulable state: Currently on Eliquis for atrial fibrillation.    Current medicines are reviewed at length with the patient today.   The patient does not have concerns regarding his medicines.  The following changes were made today:  none  Labs/ tests ordered today include:  Orders Placed This Encounter  Procedures   EKG 12-Lead     Disposition:   FU with Mayci Haning 6 months  Signed, Caden Fukushima Jorja Loa, MD  11/16/2022 11:48 AM     [facility] [address] [place] [address] [phone] (office) [phone] (fax)

## 2022-11-16 NOTE — Addendum Note (Signed)
Addended by: Baird Lyons on: 11/16/2022 01:51 PM   Modules accepted: Orders

## 2022-11-16 NOTE — Patient Instructions (Addendum)
Medication Instructions:  Your physician recommends that you continue on your current medications as directed. Please refer to the Current Medication list given to you today.  Continue Amiodarone 200 mg TWICE daily for one month after cardioversion, then reduce to 1 tablet (200 mg) ONCE daily  *If you need a refill on your cardiac medications before your next appointment, please call your pharmacy*   Lab Work: Pre ablation labs -- see procedure instruction letter:  BMP & CBC  If you have labs (blood work) drawn today and your tests are completely normal, you will receive your results only by: MyChart Message (if you have MyChart) OR A paper copy in the mail If you have any lab test that is abnormal or we need to change your treatment, we will call you to review the results.   Testing/Procedures: Your physician has recommended that you have a Cardioversion (DCCV). Electrical Cardioversion uses a jolt of electricity to your heart either through paddles or wired patches attached to your chest. This is a controlled, usually prescheduled, procedure. Defibrillation is done under light anesthesia in the hospital, and you usually go home the day of the procedure. This is done to get your heart back into a normal rhythm. You are not awake for the procedure. Please see the instructions below under "other instructions".  Your physician has requested that you have cardiac CT within 7 days PRIOR to your ablation. Cardiac computed tomography (CT) is a painless test that uses an x-ray machine to take clear, detailed pictures of your heart.  Please follow instruction below located under "other instructions". You will get a call from our office to schedule the date for this test.  Your physician has recommended that you have an ablation. Catheter ablation is a medical procedure used to treat some cardiac arrhythmias (irregular heartbeats). During catheter ablation, a long, thin, flexible tube is put into a  blood vessel in your groin (upper thigh), or neck. This tube is called an ablation catheter. It is then guided to your heart through the blood vessel. Radio frequency waves destroy small areas of heart tissue where abnormal heartbeats may cause an arrhythmia to start. Please follow instruction letter given to you today.  You will be scheduled for 03/30/2023.  Our EP procedure scheduler, April, will be in touch to go over CT & ablation instructions and send them via mychart.   Follow-Up: At [facility], you and your health needs are our priority.  As part of our continuing mission to provide you with exceptional heart care, we have created designated Provider Care Teams.  These Care Teams include your primary Cardiologist (physician) and Advanced Practice Providers (APPs -  Physician Assistants and Nurse Practitioners) who all work together to provide you with the care you need, when you need it.  Your next appointment:   1 month(s) after your ablation  The format for your next appointment:   In Person  Provider:   AFib clinic   Thank you for choosing [facility]!!   Dory Horn, RN [phone]    Other Instructions      Dear Darius Mitchell  You are scheduled for a Cardioversion on Tuesday, May 28 with Dr. Jacques Navy.  Please arrive at the [facility] (Main Entrance A) at [facility]: [address] [place], [place] [address] at 8:00 AM (This time is 1 hour(s) before your procedure to ensure your preparation). Free valet parking service is available. You will check in at ADMITTING. The support  person will be asked to wait in the waiting room.  It is OK to have someone drop you off and come back when you are ready to be discharged.      DIET:  Nothing to eat or drink after midnight except a sip of water with medications (see medication instructions below)  MEDICATION INSTRUCTIONS: !!IF ANY NEW MEDICATIONS ARE STARTED AFTER TODAY, PLEASE NOTIFY YOUR PROVIDER AS SOON  AS POSSIBLE!!  FYI: Medications such as Semaglutide (Ozempic, [place]), Tirzepatide (Mounjaro, Zepbound), Dulaglutide (Trulicity), etc ("GLP1 agonists") must be held around the time of a procedure. Talk to your provider if you take one of these.  Hold your morning medications -- ONLY TAKE YOUR ELIQUIS  Continue taking your anticoagulant (blood thinner): Apixaban (Eliquis).  You will need to continue this after your procedure until you are told by your provider that it is safe to stop.    LABS:  completed on 10/31/22  FYI:  For your safety, and to allow [place] to monitor your vital signs accurately during the surgery/procedure we request: If you have artificial nails, gel coating, SNS etc, please have those removed prior to your surgery/procedure. Not having the nail coverings /polish removed may result in cancellation or delay of your surgery/procedure.  You must have a responsible person to drive you home and stay in the waiting area during your procedure. Failure to do so could result in cancellation.  Bring your insurance cards.  *Special Note: Every effort is made to have your procedure done on time. Occasionally there are emergencies that occur at the hospital that may cause delays. Please be patient if a delay does occur.      Cardiac Ablation Cardiac ablation is a procedure to destroy (ablate) some heart tissue that is sending bad signals. These bad signals cause problems in heart rhythm. The heart has many areas that make these signals. If there are problems in these areas, they can make the heart beat in a way that is not normal. Destroying some tissues can help make the heart rhythm normal. Tell your doctor about: Any allergies you have. All medicines you are taking. These include vitamins, herbs, eye drops, creams, and over-the-counter medicines. Any problems you or family members have had with medicines that make you fall asleep (anesthetics). Any blood disorders you have. Any  surgeries you have had. Any medical conditions you have, such as kidney failure. Whether you are pregnant or may be pregnant. What are the risks? This is a safe procedure. But problems may occur, including: Infection. Bruising and bleeding. Bleeding into the chest. Stroke or blood clots. Damage to nearby areas of your body. Allergies to medicines or dyes. The need for a pacemaker if the normal system is damaged. Failure of the procedure to treat the problem. What happens before the procedure? Medicines Ask your doctor about: Changing or stopping your normal medicines. This is important. Taking aspirin and ibuprofen. Do not take these medicines unless your doctor tells you to take them. Taking other medicines, vitamins, herbs, and supplements. General instructions Follow instructions from your doctor about what you cannot eat or drink. Plan to have someone take you home from the hospital or clinic. If you will be going home right after the procedure, plan to have someone with you for 24 hours. Ask your doctor what steps will be taken to prevent infection. What happens during the procedure?  An IV tube will be put into one of your veins. You will be given a medicine to help  you relax. The skin on your neck or groin will be numbed. A cut (incision) will be made in your neck or groin. A needle will be put through your cut and into a large vein. A tube (catheter) will be put into the needle. The tube will be moved to your heart. Dye may be put through the tube. This helps your doctor see your heart. Small devices (electrodes) on the tube will send out signals. A type of energy will be used to destroy some heart tissue. The tube will be taken out. Pressure will be held on your cut. This helps stop bleeding. A bandage will be put over your cut. The exact procedure may vary among doctors and hospitals. What happens after the procedure? You will be watched until you leave the hospital  or clinic. This includes checking your heart rate, breathing rate, oxygen, and blood pressure. Your cut will be watched for bleeding. You will need to lie still for a few hours. Do not drive for 24 hours or as long as your doctor tells you. Summary Cardiac ablation is a procedure to destroy some heart tissue. This is done to treat heart rhythm problems. Tell your doctor about any medical conditions you may have. Tell him or her about all medicines you are taking to treat them. This is a safe procedure. But problems may occur. These include infection, bruising, bleeding, and damage to nearby areas of your body. Follow what your doctor tells you about food and drink. You may also be told to change or stop some of your medicines. After the procedure, do not drive for 24 hours or as long as your doctor tells you. This information is not intended to replace advice given to you by your health care provider. Make sure you discuss any questions you have with your health care provider. Document Revised: 09/03/2021 Document Reviewed: 05/16/2019 Elsevier Patient Education  2023 [employer].

## 2022-11-17 ENCOUNTER — Encounter: Payer: Self-pay | Admitting: Family Medicine

## 2022-11-17 ENCOUNTER — Ambulatory Visit (INDEPENDENT_AMBULATORY_CARE_PROVIDER_SITE_OTHER): Payer: Medicare Other | Admitting: Family Medicine

## 2022-11-17 VITALS — BP 102/60 | HR 93 | Temp 97.5°F | Ht 71.0 in | Wt 203.0 lb

## 2022-11-17 DIAGNOSIS — J208 Acute bronchitis due to other specified organisms: Secondary | ICD-10-CM

## 2022-11-17 DIAGNOSIS — B9689 Other specified bacterial agents as the cause of diseases classified elsewhere: Secondary | ICD-10-CM | POA: Diagnosis not present

## 2022-11-17 MED ORDER — DOXYCYCLINE HYCLATE 100 MG PO TABS
100.0000 mg | ORAL_TABLET | Freq: Two times a day (BID) | ORAL | 0 refills | Status: AC
Start: 2022-11-17 — End: 2022-11-24

## 2022-11-17 NOTE — Patient Instructions (Signed)
Continue to push fluids, practice good hand hygiene, and cover your mouth if you cough. ? ?If you start having fevers, shaking or shortness of breath, seek immediate care. ? ?OK to take Tylenol 1000 mg (2 extra strength tabs) or 975 mg (3 regular strength tabs) every 6 hours as needed. ? ?Let us know if you need anything. ?

## 2022-11-17 NOTE — Progress Notes (Signed)
Chief Complaint  Patient presents with   Cough    Garen Grams here for URI complaints. Here w wife.   Duration: 5 days  Associated symptoms: wheezing, shortness of breath, chest tightness, and a productive cough Denies: sinus congestion, sinus pain, rhinorrhea, itchy watery eyes, ear pain, ear drainage, sore throat, myalgia, and fevers Treatment to date: Tessalon Perles, Dayquil and Nyquil Sick contacts: No  Past Medical History:  Diagnosis Date   Afib (HCC)    Anxiety and depression 03/25/2014   BMI 30.0-30.9,adult 05/15/2015   Carpal tunnel syndrome of right wrist 10/23/2015   Diverticulosis of colon without hemorrhage 03/17/2016   Essential hypertension, benign 03/25/2014   GERD (gastroesophageal reflux disease)    Hyperlipidemia    Hypothyroidism    Low testosterone 05/15/2015   Paroxysmal atrial fibrillation (HCC) 12/03/2018   Prostate cancer screening encounter, options and risks discussed 04/30/2014   Screening for ischemic heart disease 04/30/2014   Swelling of both lower extremities 11/29/2017   Visit for preventive health examination 04/30/2014    Objective BP 102/60 (BP Location: Right Arm, Patient Position: Sitting, Cuff Size: Normal)   Pulse 93   Temp (!) 97.5 F (36.4 C) (Oral)   Ht 5\' 11"  (1.803 m)   Wt 203 lb (92.1 kg)   SpO2 98%   BMI 28.31 kg/m  General: Awake, alert, appears stated age HEENT: AT, [place], ears patent b/l and TM's neg, nares patent w/o discharge, pharynx pink and without exudates, MMM Neck: No masses or asymmetry Heart: RRR Lungs: CTAB, no accessory muscle use Psych: Age appropriate judgment and insight, normal mood and affect  Acute bacterial bronchitis - Plan: doxycycline (VIBRA-TABS) 100 MG tablet  7 d doxycycline given XR findings. Continue to push fluids, practice good hand hygiene, cover mouth when coughing. Mucinex.  F/u prn. If starting to experience fevers, shaking, or shortness of breath, seek immediate care. Pt voiced  understanding and agreement to the plan.  Jilda Roche [place], DO 11/17/22 11:22 AM

## 2022-11-18 ENCOUNTER — Other Ambulatory Visit (HOSPITAL_BASED_OUTPATIENT_CLINIC_OR_DEPARTMENT_OTHER): Payer: Self-pay | Admitting: Emergency Medicine

## 2022-11-18 NOTE — Progress Notes (Signed)
Message left, Procedure scheduled for  0900, Please arrive at the hospital at 0800, NPO after midnight on  Monday, May take meds with sips of water in the AM, please have transportation for home post procedure, and someone to stay with pt for approximately 24 hours after  Pt informed to call dr's office if he hasn't been taking blood thinner regularly or has missed any doses

## 2022-11-20 ENCOUNTER — Encounter: Payer: Self-pay | Admitting: Cardiology

## 2022-11-21 ENCOUNTER — Encounter (HOSPITAL_COMMUNITY): Payer: Self-pay

## 2022-11-21 ENCOUNTER — Other Ambulatory Visit: Payer: Self-pay

## 2022-11-21 ENCOUNTER — Emergency Department (HOSPITAL_COMMUNITY)
Admission: EM | Admit: 2022-11-21 | Discharge: 2022-11-21 | Disposition: A | Discharge status: Home / Self Care | Payer: Medicare Other | Attending: Emergency Medicine | Admitting: Emergency Medicine

## 2022-11-21 ENCOUNTER — Emergency Department (HOSPITAL_COMMUNITY): Payer: Medicare Other

## 2022-11-21 ENCOUNTER — Encounter: Payer: Self-pay | Admitting: Family Medicine

## 2022-11-21 DIAGNOSIS — Z7901 Long term (current) use of anticoagulants: Secondary | ICD-10-CM | POA: Diagnosis not present

## 2022-11-21 DIAGNOSIS — Z79899 Other long term (current) drug therapy: Secondary | ICD-10-CM | POA: Diagnosis not present

## 2022-11-21 DIAGNOSIS — R0789 Other chest pain: Secondary | ICD-10-CM | POA: Diagnosis not present

## 2022-11-21 DIAGNOSIS — I509 Heart failure, unspecified: Secondary | ICD-10-CM | POA: Diagnosis not present

## 2022-11-21 DIAGNOSIS — I5020 Unspecified systolic (congestive) heart failure: Secondary | ICD-10-CM

## 2022-11-21 DIAGNOSIS — I4819 Other persistent atrial fibrillation: Secondary | ICD-10-CM

## 2022-11-21 DIAGNOSIS — R059 Cough, unspecified: Secondary | ICD-10-CM | POA: Diagnosis present

## 2022-11-21 DIAGNOSIS — I4892 Unspecified atrial flutter: Secondary | ICD-10-CM | POA: Diagnosis not present

## 2022-11-21 DIAGNOSIS — I110 Hypertensive heart disease with heart failure: Secondary | ICD-10-CM | POA: Insufficient documentation

## 2022-11-21 DIAGNOSIS — R0602 Shortness of breath: Secondary | ICD-10-CM | POA: Diagnosis not present

## 2022-11-21 LAB — BRAIN NATRIURETIC PEPTIDE: B Natriuretic Peptide: 598.3 pg/mL — ABNORMAL HIGH (ref 0.0–100.0)

## 2022-11-21 LAB — CBC
HCT: 40.1 % (ref 39.0–52.0)
Hemoglobin: 12.8 g/dL — ABNORMAL LOW (ref 13.0–17.0)
MCH: 30.5 pg (ref 26.0–34.0)
MCHC: 31.9 g/dL (ref 30.0–36.0)
MCV: 95.7 fL (ref 80.0–100.0)
Platelets: 150 10*3/uL (ref 150–400)
RBC: 4.19 MIL/uL — ABNORMAL LOW (ref 4.22–5.81)
RDW: 14.3 % (ref 11.5–15.5)
WBC: 5.4 10*3/uL (ref 4.0–10.5)
nRBC: 0 % (ref 0.0–0.2)

## 2022-11-21 LAB — BASIC METABOLIC PANEL
Anion gap: 8 (ref 5–15)
BUN: 15 mg/dL (ref 8–23)
CO2: 26 mmol/L (ref 22–32)
Calcium: 8.9 mg/dL (ref 8.9–10.3)
Chloride: 101 mmol/L (ref 98–111)
Creatinine, Ser: 1.19 mg/dL (ref 0.61–1.24)
GFR, Estimated: >60 mL/min (ref >60)
Glucose, Bld: 125 mg/dL — ABNORMAL HIGH (ref 70–99)
Potassium: 3.6 mmol/L (ref 3.5–5.1)
Sodium: 135 mmol/L (ref 135–145)

## 2022-11-21 LAB — MAGNESIUM: Magnesium: 2.1 mg/dL (ref 1.7–2.4)

## 2022-11-21 MED ORDER — FUROSEMIDE 10 MG/ML IJ SOLN
40.0000 mg | Freq: Once | INTRAMUSCULAR | Status: AC
  Administered 2022-11-21: 40 mg via INTRAVENOUS
  Filled 2022-11-21: qty 4

## 2022-11-21 NOTE — H&P (View-Only) (Signed)
 Cardiology Consultation   Patient ID: Darius Mitchell MRN: [id number]; DOB: 09/05/1950  Admit date: 11/21/2022 Date of Consult: 11/21/2022  PCP:  Wendling, Nicholas Paul, DO   [facility] Providers Cardiologist:  None   Krasowski     Patient Profile:   Darius Mitchell is a 71 y.o. male with a hx of Afib, HFrEF, HLD, prior cardioversions who is being seen 11/21/2022 for the evaluation of Afib/HF at the request of Dr. Knapp.  History of Present Illness:   Darius Mitchell is a 72-year-old gentleman with recurrent paroxysmal atrial fibrillation/atrial flutter who presents for worsening lower extremity swelling and cough to the emergency department.  Cardiology is consulted given the patient has a cardioversion scheduled for tomorrow to ensure appropriateness of planned procedure.  He was seen by my partner Dr. Krasowski on 10/31/2022 and underwent successful cardioversion 11/02/2022 for atrial fibrillation.  Was seen by electrophysiology Dr. Camnitz 11/16/2022, noted to have ejection fraction 35 to 40% and was planned for future ablation in several months.  He was started on amiodarone at that visit in anticipation of repeat cardioversion.  Repeat cardioversion planned for 11/22/2022.  Patient notes that over the last 2 to 3 days he has had increasing lower extremity swelling and has had a nagging cough.  Saw his primary care physician who felt it may represent bronchitis and was started on doxycycline.  Patient has not been sleeping well and notes that when he lays flat he will have coughing where he can cough up slight mucus.  He did have greenish productive sputum at the beginning of his cough prior to doxycycline therapy.  He is continue to take his Lasix and drink plenty of fluids but has not noticed an increase in his urine output despite increase in fluid intake.  Denies chest pain, denies significant exertional shortness of breath.  Noted to be in atrial flutter today.  Heart rate today  around 115 on my exam.  Patient comfortable in the room and speaks in full sentences without dyspnea.  Pertinent labs include BNP 598, increased from about 1 month ago when it was 339, hemoglobin 12.8 and stable, normal white blood cell count, normal platelet count.  EKG atrial flutter rate 114, incomplete right bundle branch block.  Telemetry same.   Past Medical History:  Diagnosis Date   Afib (HCC)    Anxiety and depression 03/25/2014   BMI 30.0-30.9,adult 05/15/2015   Carpal tunnel syndrome of right wrist 10/23/2015   Diverticulosis of colon without hemorrhage 03/17/2016   Essential hypertension, benign 03/25/2014   GERD (gastroesophageal reflux disease)    Hyperlipidemia    Hypothyroidism    Low testosterone 05/15/2015   Paroxysmal atrial fibrillation (HCC) 12/03/2018   Prostate cancer screening encounter, options and risks discussed 04/30/2014   Screening for ischemic heart disease 04/30/2014   Swelling of both lower extremities 11/29/2017   Visit for preventive health examination 04/30/2014    Past Surgical History:  Procedure Laterality Date   APPENDECTOMY  1971   CARDIOVERSION N/A 11/02/2022   Procedure: CARDIOVERSION;  Surgeon: Tobb, Kardie, DO;  Location: [facility] INVASIVE CV [facility];  Service: Cardiovascular;  Laterality: N/A;   WISDOM TOOTH EXTRACTION  1972     Home Medications:  Prior to Admission medications   Medication Sig Start Date End Date Taking? Authorizing Provider  amiodarone (PACERONE) 200 MG tablet Take 1 tablet (200 mg total) by mouth 2 (two) times daily. 10/04/22   Krasowski, Robert J, MD  apixaban (ELIQUIS) 5 MG   TABS tablet Take 1 tablet by mouth 2 (two) times daily. 11/02/22 11/02/23  Krasowski, Robert J, MD  atorvastatin (LIPITOR) 80 MG tablet TAKE 1 TABLET BY MOUTH EVERY DAY 09/13/22   Wendling, Nicholas Paul, DO  benzonatate (TESSALON) 200 MG capsule Take 1 capsule (200 mg total) by mouth 2 (two) times daily as needed for cough. 11/16/22   Wendling, Nicholas Paul,  DO  diltiazem (CARDIZEM) 30 MG tablet Take 1 tablet (30 mg total) by mouth 4 (four) times daily. For heart rate above 100 beats per minute in AFib. Patient taking differently: Take 30 mg by mouth 4 (four) times daily as needed (afib). 10/07/22 11/17/23  Trifan, Matthew J, MD  doxycycline (VIBRA-TABS) 100 MG tablet Take 1 tablet (100 mg total) by mouth 2 (two) times daily for 7 days. 11/17/22 11/24/22  Wendling, Nicholas Paul, DO  ezetimibe (ZETIA) 10 MG tablet TAKE 1 TABLET BY MOUTH EVERY DAY 09/13/22   Wendling, Nicholas Paul, DO  furosemide (LASIX) 20 MG tablet Take 1 tablet (20 mg total) by mouth daily. 09/22/22   Krasowski, Robert J, MD  Glucosamine-Chondroit-Biofl-Mn (GLUCOSAMINE CHONDROIT,BIOFLAV,) CAPS Take 1 tablet by mouth in the morning.    [provider]  levothyroxine (SYNTHROID) 125 MCG tablet TAKE 1 TABLET BY MOUTH ONCE DAILY BEFORE BREAKFAST 08/31/22 08/31/23  Wendling, Nicholas Paul, DO  Multiple Vitamins-Minerals (CENTRUM SILVER ULTRA MENS) TABS Take 1 tablet by mouth in the morning.    [provider]  omeprazole (PRILOSEC) 40 MG capsule TAKE 1 CAPSULE BY MOUTH EVERY DAY 09/13/22   Wendling, Nicholas Paul, DO  PARoxetine (PAXIL) 30 MG tablet TAKE 1 TABLET BY MOUTH EVERY DAY 09/13/22   Wendling, Nicholas Paul, DO  potassium chloride (KLOR-CON) 10 MEQ tablet Take 1 tablet (10 mEq total) by mouth daily. 09/22/22   Krasowski, Robert J, MD  sacubitril-valsartan (ENTRESTO) 24-26 MG Take 1 tablet by mouth 2 (two) times daily. 09/22/22   Krasowski, Robert J, MD  metoprolol succinate (TOPROL-XL) 25 MG 24 hr tablet TAKE 1 TABLET BY MOUTH ONCE DAILY 12/18/19 05/18/20  Krasowski, Robert J, MD    Inpatient Medications: Scheduled Meds:  Continuous Infusions:  PRN Meds:   Allergies:    Allergies  Allergen Reactions   Codeine Nausea Only    GI Intolerance   Niacin Other (See Comments)    Flushing    Social History:   Social History   Socioeconomic History   Marital status:  Married    Spouse name: Not on file   Number of children: Not on file   Years of education: Not on file   Highest education level: Bachelor's degree (e.g., BA, AB, BS)  Occupational History   Not on file  Tobacco Use   Smoking status: Former    Packs/day: 0.25    Years: 25.00    Additional pack years: 0.00    Total pack years: 6.25    Types: Cigarettes    Quit date: 05/28/2019    Years since quitting: 3.4   Smokeless tobacco: Never  Vaping Use   Vaping Use: Never used  Substance and Sexual Activity   Alcohol use: Never   Drug use: Never   Sexual activity: Not on file  Other Topics Concern   Not on file  Social History Narrative   Not on file   Social Determinants of Health   Financial Resource Strain: Low Risk  (11/06/2022)   Overall Financial Resource Strain (CARDIA)    Difficulty of Paying Living Expenses: Not hard at   all  Food Insecurity: No Food Insecurity (11/06/2022)   Hunger Vital Sign    Worried About Running Out of Food in the Last Year: Never true    Ran Out of Food in the Last Year: Never true  Transportation Needs: No Transportation Needs (11/06/2022)   PRAPARE - Transportation    Lack of Transportation (Medical): No    Lack of Transportation (Non-Medical): No  Physical Activity: Sufficiently Active (11/06/2022)   Exercise Vital Sign    Days of Exercise per Week: 4 days    Minutes of Exercise per Session: 150+ min  Stress: Stress Concern Present (11/06/2022)   [employer] - Occupational Stress Questionnaire    Feeling of Stress : To some extent  Social Connections: Moderately Integrated (11/06/2022)   Social Connection and Isolation Panel [NHANES]    Frequency of Communication with Friends and Family: Three times a week    Frequency of Social Gatherings with Friends and Family: Twice a week    Attends Religious Services: 1 to 4 times per year    Active Member of Clubs or Organizations: No    Attends Club or Organization Meetings:  Not on file    Marital Status: Married  Intimate Partner Violence: Not on file    Family History:    Family History  Problem Relation Age of Onset   Stroke Mother 89       Deceased   Heart attack Mother    Lymphoma Father 86       Deceased   Heart disease Father    Stroke Maternal Grandmother    Stomach cancer Paternal Grandfather    Healthy Brother        x2   Healthy Sister    Hypertension Daughter    Healthy Daughter      ROS:  Please see the history of present illness.   All other ROS reviewed and negative.     Physical Exam/Data:   Vitals:   11/21/22 1022 11/21/22 1023 11/21/22 1230 11/21/22 1320  BP:  112/82 105/83   Pulse:  (!) 114 89   Resp:  16 16   Temp:  97.6 F (36.4 C)  97.7 F (36.5 C)  TempSrc:  Oral  Oral  SpO2:  100% 96%   Weight: 92.1 kg     Height: 5' 11" (1.803 m)      No intake or output data in the 24 hours ending 11/21/22 1420    11/21/2022   10:22 AM 11/17/2022   11:06 AM 11/16/2022   10:21 AM  Last 3 Weights  Weight (lbs) 203 lb 203 lb 206 lb 3.2 oz  Weight (kg) 92.08 kg 92.08 kg 93.532 kg     Body mass index is 28.31 kg/m.  General:  Well nourished, well developed, in no acute distress HEENT: normal Neck: no JVD Vascular: No carotid bruits; Distal pulses 2+ bilaterally Cardiac:  normal S1, S2; irregular and tachycardic; no murmur  Lungs:  clear to auscultation bilaterally, no wheezing, rhonchi or rales  Abd: soft, nontender, no hepatomegaly  Ext: 1+ edema lower remedies Musculoskeletal:  No deformities, BUE and BLE strength normal and equal Skin: warm and dry  Neuro:  CNs 2-12 intact, no focal abnormalities noted Psych:  Normal affect   EKG:  The EKG was personally reviewed and demonstrates: Atrial flutter rate 114 incomplete right bundle branch block Telemetry:  Telemetry was personally reviewed and demonstrates: Atrial flutter rate 115  Relevant CV Studies: Echo 09/29/2022  1.   Atrial fibrillation. Left ventricular  ejection fraction, by  estimation, is 35 to 40%. Left ventricular ejection fraction by 2D MOD  biplane is 40.6 %. The left ventricle has moderately decreased function.  The left ventricle demonstrates global  hypokinesis. There is mild concentric left ventricular hypertrophy. Left  ventricular diastolic parameters are indeterminate.   2. Right ventricular systolic function is moderately reduced. The right  ventricular size is normal. There is mildly elevated pulmonary artery  systolic pressure.   3. Left atrial size was severely dilated.   4. The mitral valve is degenerative. Moderate mitral valve regurgitation.  No evidence of mitral stenosis.   5. Tricuspid valve regurgitation is mild to moderate.   6. The aortic valve is tricuspid. Aortic valve regurgitation is mild.  Aortic valve sclerosis is present, with no evidence of aortic valve  stenosis.   7. Aortic Normal DTA. There is borderline dilatation of the ascending  aorta, measuring 38 mm.   8. The inferior vena cava is normal in size with greater than 50%  respiratory variability, suggesting right atrial pressure of 3 mmHg.  Laboratory Data:  High Sensitivity Troponin:  No results for input(s): "TROPONINIHS" in the last 720 hours.   Chemistry Recent Labs  Lab 11/21/22 1028  NA 135  K 3.6  CL 101  CO2 26  GLUCOSE 125*  BUN 15  CREATININE 1.19  CALCIUM 8.9  MG 2.1  GFRNONAA >60  ANIONGAP 8    No results for input(s): "PROT", "ALBUMIN", "AST", "ALT", "ALKPHOS", "BILITOT" in the last 168 hours. Lipids No results for input(s): "CHOL", "TRIG", "HDL", "LABVLDL", "LDLCALC", "CHOLHDL" in the last 168 hours.  Hematology Recent Labs  Lab 11/21/22 1028  WBC 5.4  RBC 4.19*  HGB 12.8*  HCT 40.1  MCV 95.7  MCH 30.5  MCHC 31.9  RDW 14.3  PLT 150   Thyroid No results for input(s): "TSH", "FREET4" in the last 168 hours.  BNP Recent Labs  Lab 11/21/22 1028  BNP 598.3*    DDimer No results for input(s): "DDIMER" in the  last 168 hours.   Radiology/Studies:  DG Chest Portable 1 View  Result Date: 11/21/2022 CLINICAL DATA:  Worsening shortness of breath. Dyspnea and chest discomfort. EXAM: PORTABLE CHEST 1 VIEW COMPARISON:  11/09/2022 FINDINGS: Stable mild cardiomegaly. No evidence of acute infiltrate or pleural effusion. IMPRESSION: Mild cardiomegaly. No active lung disease. Electronically Signed   By: John A Stahl M.D.   On: 11/21/2022 11:38     Assessment and Plan:   Principal Problem:   HFrEF (heart failure with reduced ejection fraction) (HCC) Active Problems:   Persistent atrial fibrillation (HCC)   Atrial flutter (HCC)  HFrEF Atrial flutter Persistent atrial fibrillation Patient noted to have a reduced ejection fraction but normal nuclear stress test earlier this year.  Suspect reduced EF may be related to atrial arrhythmia.  Patient presents today with evidence of worsening heart failure symptoms with lower extremity edema and cough with orthopnea.  Patient appears nontoxic and is eager to go home if able.  We discussed options of remaining in hospital for in-hospital diuresis and cardioversion versus 1 dose of IV Lasix now and increasing his home Lasix from 20 mg daily to 40 mg daily and defer cardioversion for a few more days rescheduling for 11/24/2022.  Patient prefers the latter and we participated in shared decision making regarding red flag symptoms to return to the hospital if concerned.  Patient should continue anticoagulation without interruption.  Patient also maintains amiodarone as   per ordered by EP. -Message has been sent to scheduling to reschedule his cardioversion.  Patient knows to contact our office if he has no increased urine output with IV dose of Lasix today or increasing his dose of Lasix at home.  Plan of care discussed with Dr. Knapp who feels patient is stable for ED discharge otherwise.   Risk Assessment/Risk Scores:        New York Heart Association (NYHA) Functional  Class NYHA Class II  CHA2DS2-VASc Score = 2   This indicates a 2.2% annual risk of stroke. The patient's score is based upon: CHF History: 1 HTN History: 0 Diabetes History: 0 Stroke History: 0 Vascular Disease History: 0 Age Score: 1 Gender Score: 0   For questions or updates, please contact [facility] Please consult [email or web address] for contact info under    Signed, Jamerion Cabello A Felipe Paluch, MD  11/21/2022 2:20 PM  

## 2022-11-21 NOTE — Consult Note (Signed)
Cardiology Consultation   Patient ID: Darius Mitchell MRN: [id number]; DOB: 11-06-1950  Admit date: 11/21/2022 Date of Consult: 11/21/2022  PCP:  [name], DO   [facility] Providers Cardiologist:  None   Bing Matter     Patient Profile:   Darius Mitchell is a 72 y.o. male with a hx of Afib, HFrEF, HLD, prior cardioversions who is being seen 11/21/2022 for the evaluation of Afib/HF at the request of Dr. Lynelle Doctor.  History of Present Illness:   Darius Mitchell is a 72 year old gentleman with recurrent paroxysmal atrial fibrillation/atrial flutter who presents for worsening lower extremity swelling and cough to the emergency department.  Cardiology is consulted given the patient has a cardioversion scheduled for tomorrow to ensure appropriateness of planned procedure.  He was seen by my partner Dr. Bing Matter on 10/31/2022 and underwent successful cardioversion 11/02/2022 for atrial fibrillation.  Was seen by electrophysiology Dr. Elberta Fortis 11/16/2022, noted to have ejection fraction 35 to 40% and was planned for future ablation in several months.  He was started on amiodarone at that visit in anticipation of repeat cardioversion.  Repeat cardioversion planned for 11/22/2022.  Patient notes that over the last 2 to 3 days he has had increasing lower extremity swelling and has had a nagging cough.  Saw his primary care physician who felt it may represent bronchitis and was started on doxycycline.  Patient has not been sleeping well and notes that when he lays flat he will have coughing where he can cough up slight mucus.  He did have greenish productive sputum at the beginning of his cough prior to doxycycline therapy.  He is continue to take his Lasix and drink plenty of fluids but has not noticed an increase in his urine output despite increase in fluid intake.  Denies chest pain, denies significant exertional shortness of breath.  Noted to be in atrial flutter today.  Heart rate today  around 115 on my exam.  Patient comfortable in the room and speaks in full sentences without dyspnea.  Pertinent labs include BNP 598, increased from about 1 month ago when it was 339, hemoglobin 12.8 and stable, normal white blood cell count, normal platelet count.  EKG atrial flutter rate 114, incomplete right bundle branch block.  Telemetry same.   Past Medical History:  Diagnosis Date   Afib (HCC)    Anxiety and depression 03/25/2014   BMI 30.0-30.9,adult 05/15/2015   Carpal tunnel syndrome of right wrist 10/23/2015   Diverticulosis of colon without hemorrhage 03/17/2016   Essential hypertension, benign 03/25/2014   GERD (gastroesophageal reflux disease)    Hyperlipidemia    Hypothyroidism    Low testosterone 05/15/2015   Paroxysmal atrial fibrillation (HCC) 12/03/2018   Prostate cancer screening encounter, options and risks discussed 04/30/2014   Screening for ischemic heart disease 04/30/2014   Swelling of both lower extremities 11/29/2017   Visit for preventive health examination 04/30/2014    Past Surgical History:  Procedure Laterality Date   APPENDECTOMY  1971   CARDIOVERSION N/A 11/02/2022   Procedure: CARDIOVERSION;  Surgeon: Thomasene Ripple, DO;  Location: [facility] INVASIVE CV [facility];  Service: Cardiovascular;  Laterality: N/A;   WISDOM TOOTH EXTRACTION  1972     Home Medications:  Prior to Admission medications   Medication Sig Start Date End Date Taking? Authorizing Provider  amiodarone (PACERONE) 200 MG tablet Take 1 tablet (200 mg total) by mouth 2 (two) times daily. 10/04/22   Georgeanna Lea, MD  apixaban (ELIQUIS) 5 MG  TABS tablet Take 1 tablet by mouth 2 (two) times daily. 11/02/22 11/02/23  Georgeanna Lea, MD  atorvastatin (LIPITOR) 80 MG tablet TAKE 1 TABLET BY MOUTH EVERY DAY 09/13/22   Wendling, Jilda Roche, DO  benzonatate (TESSALON) 200 MG capsule Take 1 capsule (200 mg total) by mouth 2 (two) times daily as needed for cough. 11/16/22   [name],  DO  diltiazem (CARDIZEM) 30 MG tablet Take 1 tablet (30 mg total) by mouth 4 (four) times daily. For heart rate above 100 beats per minute in AFib. Patient taking differently: Take 30 mg by mouth 4 (four) times daily as needed (afib). 10/07/22 11/17/23  Terald Sleeper, MD  doxycycline (VIBRA-TABS) 100 MG tablet Take 1 tablet (100 mg total) by mouth 2 (two) times daily for 7 days. 11/17/22 11/24/22  [name], DO  ezetimibe (ZETIA) 10 MG tablet TAKE 1 TABLET BY MOUTH EVERY DAY 09/13/22   Wendling, Jilda Roche, DO  furosemide (LASIX) 20 MG tablet Take 1 tablet (20 mg total) by mouth daily. 09/22/22   Georgeanna Lea, MD  Glucosamine-Chondroit-Biofl-Mn (GLUCOSAMINE CHONDROIT,BIOFLAV,) CAPS Take 1 tablet by mouth in the morning.    [provider]  levothyroxine (SYNTHROID) 125 MCG tablet TAKE 1 TABLET BY MOUTH ONCE DAILY BEFORE BREAKFAST 08/31/22 08/31/23  [name], DO  Multiple Vitamins-Minerals (CENTRUM SILVER ULTRA MENS) TABS Take 1 tablet by mouth in the morning.    [provider]  omeprazole (PRILOSEC) 40 MG capsule TAKE 1 CAPSULE BY MOUTH EVERY DAY 09/13/22   [name], DO  PARoxetine (PAXIL) 30 MG tablet TAKE 1 TABLET BY MOUTH EVERY DAY 09/13/22   Wendling, Jilda Roche, DO  potassium chloride (KLOR-CON) 10 MEQ tablet Take 1 tablet (10 mEq total) by mouth daily. 09/22/22   Georgeanna Lea, MD  sacubitril-valsartan (ENTRESTO) 24-26 MG Take 1 tablet by mouth 2 (two) times daily. 09/22/22   Georgeanna Lea, MD  metoprolol succinate (TOPROL-XL) 25 MG 24 hr tablet TAKE 1 TABLET BY MOUTH ONCE DAILY 12/18/19 05/18/20  Georgeanna Lea, MD    Inpatient Medications: Scheduled Meds:  Continuous Infusions:  PRN Meds:   Allergies:    Allergies  Allergen Reactions   Codeine Nausea Only    GI Intolerance   Niacin Other (See Comments)    Flushing    Social History:   Social History   Socioeconomic History   Marital status:  Married    Spouse name: Not on file   Number of children: Not on file   Years of education: Not on file   Highest education level: Bachelor's degree (e.g., BA, AB, BS)  Occupational History   Not on file  Tobacco Use   Smoking status: Former    Packs/day: 0.25    Years: 25.00    Additional pack years: 0.00    Total pack years: 6.25    Types: Cigarettes    Quit date: 05/28/2019    Years since quitting: 3.4   Smokeless tobacco: Never  Vaping Use   Vaping Use: Never used  Substance and Sexual Activity   Alcohol use: Never   Drug use: Never   Sexual activity: Not on file  Other Topics Concern   Not on file  Social History Narrative   Not on file   Social Determinants of Health   Financial Resource Strain: Low Risk  (11/06/2022)   Overall Financial Resource Strain (CARDIA)    Difficulty of Paying Living Expenses: Not hard at  all  Food Insecurity: No Food Insecurity (11/06/2022)   Hunger Vital Sign    Worried About Running Out of Food in the Last Year: Never true    Ran Out of Food in the Last Year: Never true  Transportation Needs: No Transportation Needs (11/06/2022)   PRAPARE - Administrator, Civil Service (Medical): No    Lack of Transportation (Non-Medical): No  Physical Activity: Sufficiently Active (11/06/2022)   Exercise Vital Sign    Days of Exercise per Week: 4 days    Minutes of Exercise per Session: 150+ min  Stress: Stress Concern Present (11/06/2022)   [employer] of Occupational Health - Occupational Stress Questionnaire    Feeling of Stress : To some extent  Social Connections: Moderately Integrated (11/06/2022)   Social Connection and Isolation Panel [NHANES]    Frequency of Communication with Friends and Family: Three times a week    Frequency of Social Gatherings with Friends and Family: Twice a week    Attends Religious Services: 1 to 4 times per year    Active Member of [employer] or Organizations: No    Attends Engineer, structural:  Not on file    Marital Status: Married  Catering manager Violence: Not on file    Family History:    Family History  Problem Relation Age of Onset   Stroke Mother 31       Deceased   Heart attack Mother    Lymphoma Father 31       Deceased   Heart disease Father    Stroke Maternal Grandmother    Stomach cancer Paternal Grandfather    Healthy Brother        x2   Healthy Sister    Hypertension Daughter    Healthy Daughter      ROS:  Please see the history of present illness.   All other ROS reviewed and negative.     Physical Exam/Data:   Vitals:   11/21/22 1022 11/21/22 1023 11/21/22 1230 11/21/22 1320  BP:  112/82 105/83   Pulse:  (!) 114 89   Resp:  16 16   Temp:  97.6 F (36.4 C)  97.7 F (36.5 C)  TempSrc:  Oral  Oral  SpO2:  100% 96%   Weight: 92.1 kg     Height: 5\' 11"  (1.803 m)      No intake or output data in the 24 hours ending 11/21/22 1420    11/21/2022   10:22 AM 11/17/2022   11:06 AM 11/16/2022   10:21 AM  Last 3 Weights  Weight (lbs) 203 lb 203 lb 206 lb 3.2 oz  Weight (kg) 92.08 kg 92.08 kg 93.532 kg     Body mass index is 28.31 kg/m.  General:  Well nourished, well developed, in no acute distress HEENT: normal Neck: no JVD Vascular: No carotid bruits; Distal pulses 2+ bilaterally Cardiac:  normal S1, S2; irregular and tachycardic; no murmur  Lungs:  clear to auscultation bilaterally, no wheezing, rhonchi or rales  Abd: soft, nontender, no hepatomegaly  Ext: 1+ edema lower remedies Musculoskeletal:  No deformities, BUE and BLE strength normal and equal Skin: warm and dry  Neuro:  CNs 2-12 intact, no focal abnormalities noted Psych:  Normal affect   EKG:  The EKG was personally reviewed and demonstrates: Atrial flutter rate 114 incomplete right bundle branch block Telemetry:  Telemetry was personally reviewed and demonstrates: Atrial flutter rate 115  Relevant CV Studies: Echo 09/29/2022  1.  Atrial fibrillation. Left ventricular  ejection fraction, by  estimation, is 35 to 40%. Left ventricular ejection fraction by 2D MOD  biplane is 40.6 %. The left ventricle has moderately decreased function.  The left ventricle demonstrates global  hypokinesis. There is mild concentric left ventricular hypertrophy. Left  ventricular diastolic parameters are indeterminate.   2. Right ventricular systolic function is moderately reduced. The right  ventricular size is normal. There is mildly elevated pulmonary artery  systolic pressure.   3. Left atrial size was severely dilated.   4. The mitral valve is degenerative. Moderate mitral valve regurgitation.  No evidence of mitral stenosis.   5. Tricuspid valve regurgitation is mild to moderate.   6. The aortic valve is tricuspid. Aortic valve regurgitation is mild.  Aortic valve sclerosis is present, with no evidence of aortic valve  stenosis.   7. Aortic Normal DTA. There is borderline dilatation of the ascending  aorta, measuring 38 mm.   8. The inferior vena cava is normal in size with greater than 50%  respiratory variability, suggesting right atrial pressure of 3 mmHg.  Laboratory Data:  High Sensitivity Troponin:  No results for input(s): "TROPONINIHS" in the last 720 hours.   Chemistry Recent Labs  Lab 11/21/22 1028  NA 135  K 3.6  CL 101  CO2 26  GLUCOSE 125*  BUN 15  CREATININE 1.19  CALCIUM 8.9  MG 2.1  GFRNONAA >60  ANIONGAP 8    No results for input(s): "PROT", "ALBUMIN", "AST", "ALT", "ALKPHOS", "BILITOT" in the last 168 hours. Lipids No results for input(s): "CHOL", "TRIG", "HDL", "LABVLDL", "LDLCALC", "CHOLHDL" in the last 168 hours.  Hematology Recent Labs  Lab 11/21/22 1028  WBC 5.4  RBC 4.19*  HGB 12.8*  HCT 40.1  MCV 95.7  MCH 30.5  MCHC 31.9  RDW 14.3  PLT 150   Thyroid No results for input(s): "TSH", "FREET4" in the last 168 hours.  BNP Recent Labs  Lab 11/21/22 1028  BNP 598.3*    DDimer No results for input(s): "DDIMER" in the  last 168 hours.   Radiology/Studies:  DG Chest Portable 1 View  Result Date: 11/21/2022 CLINICAL DATA:  Worsening shortness of breath. Dyspnea and chest discomfort. EXAM: PORTABLE CHEST 1 VIEW COMPARISON:  11/09/2022 FINDINGS: Stable mild cardiomegaly. No evidence of acute infiltrate or pleural effusion. IMPRESSION: Mild cardiomegaly. No active lung disease. Electronically Signed   By: Danae Orleans M.D.   On: 11/21/2022 11:38     Assessment and Plan:   Principal Problem:   HFrEF (heart failure with reduced ejection fraction) (HCC) Active Problems:   Persistent atrial fibrillation (HCC)   Atrial flutter (HCC)  HFrEF Atrial flutter Persistent atrial fibrillation Patient noted to have a reduced ejection fraction but normal nuclear stress test earlier this year.  Suspect reduced EF may be related to atrial arrhythmia.  Patient presents today with evidence of worsening heart failure symptoms with lower extremity edema and cough with orthopnea.  Patient appears nontoxic and is eager to go home if able.  We discussed options of remaining in hospital for in-hospital diuresis and cardioversion versus 1 dose of IV Lasix now and increasing his home Lasix from 20 mg daily to 40 mg daily and defer cardioversion for a few more days rescheduling for 11/24/2022.  Patient prefers the latter and we participated in shared decision making regarding red flag symptoms to return to the hospital if concerned.  Patient should continue anticoagulation without interruption.  Patient also maintains amiodarone as  per ordered by EP. -Message has been sent to scheduling to reschedule his cardioversion.  Patient knows to contact our office if he has no increased urine output with IV dose of Lasix today or increasing his dose of Lasix at home.  Plan of care discussed with Dr. Lynelle Doctor who feels patient is stable for ED discharge otherwise.   Risk Assessment/Risk Scores:        New York Heart Association (NYHA) Functional  Class NYHA Class II  CHA2DS2-VASc Score = 2   This indicates a 2.2% annual risk of stroke. The patient's score is based upon: CHF History: 1 HTN History: 0 Diabetes History: 0 Stroke History: 0 Vascular Disease History: 0 Age Score: 1 Gender Score: 0   For questions or updates, please contact [facility] Please consult [email or web address] for contact info under    Signed, Parke Poisson, MD  11/21/2022 2:20 PM

## 2022-11-21 NOTE — Discharge Instructions (Addendum)
Follow-up with the recommendations provided by Dr. Rudene Anda.  Increase your Lasix to 40 mg daily.  Follow-up for your cardioversion procedure

## 2022-11-21 NOTE — ED Provider Notes (Signed)
[place] EMERGENCY DEPARTMENT AT [facility] Provider Note   CSN: [id number] Arrival date & time: 11/21/22  1014     History {Add pertinent medical, surgical, social history, OB history to HPI:1} Chief Complaint  Patient presents with   Cough    Darius Mitchell is a 72 y.o. male.   Cough    Patient has a history of hyperlipidemia, reflux, hypertension, paroxysmal atrial fibrillation.  Patient states he has had a cough now for several months.  He notices it especially whenever he lies flat.  He also has history of atrial fibrillation and is scheduled for a cardioversion procedure tomorrow.  Patient is also possibly going to have an ablation procedure later this year.  Patient saw his primary doctor as he thought he might have bronchitis.  He was started on a course of doxycycline.  Patient has been trying cold medications as well as a cough medication.  He has not been sleeping well.  Patient was started on doxycycline by his primary doctor on the 23rd.  Patient has not noticed much improvement.  He is also noticed increasing leg swelling.  He does not think he is urinating as much as he should despite taking his Lasix.  Patient wants to make sure that nothing is going on that would prevent him from having his ablation tomorrow  Home Medications Prior to Admission medications   Medication Sig Start Date End Date Taking? Authorizing Provider  amiodarone (PACERONE) 200 MG tablet Take 1 tablet (200 mg total) by mouth 2 (two) times daily. 10/04/22   Georgeanna Lea, MD  apixaban (ELIQUIS) 5 MG TABS tablet Take 1 tablet by mouth 2 (two) times daily. 11/02/22 11/02/23  Georgeanna Lea, MD  atorvastatin (LIPITOR) 80 MG tablet TAKE 1 TABLET BY MOUTH EVERY DAY 09/13/22   Wendling, Jilda Roche, DO  benzonatate (TESSALON) 200 MG capsule Take 1 capsule (200 mg total) by mouth 2 (two) times daily as needed for cough. 11/16/22   [name], DO  diltiazem (CARDIZEM) 30 MG  tablet Take 1 tablet (30 mg total) by mouth 4 (four) times daily. For heart rate above 100 beats per minute in AFib. Patient taking differently: Take 30 mg by mouth 4 (four) times daily as needed (afib). 10/07/22 11/17/23  Terald Sleeper, MD  doxycycline (VIBRA-TABS) 100 MG tablet Take 1 tablet (100 mg total) by mouth 2 (two) times daily for 7 days. 11/17/22 11/24/22  [name], DO  ezetimibe (ZETIA) 10 MG tablet TAKE 1 TABLET BY MOUTH EVERY DAY 09/13/22   Wendling, Jilda Roche, DO  furosemide (LASIX) 20 MG tablet Take 1 tablet (20 mg total) by mouth daily. 09/22/22   Georgeanna Lea, MD  Glucosamine-Chondroit-Biofl-Mn (GLUCOSAMINE CHONDROIT,BIOFLAV,) CAPS Take 1 tablet by mouth in the morning.    [provider]  levothyroxine (SYNTHROID) 125 MCG tablet TAKE 1 TABLET BY MOUTH ONCE DAILY BEFORE BREAKFAST 08/31/22 08/31/23  [name], DO  Multiple Vitamins-Minerals (CENTRUM SILVER ULTRA MENS) TABS Take 1 tablet by mouth in the morning.    [provider]  omeprazole (PRILOSEC) 40 MG capsule TAKE 1 CAPSULE BY MOUTH EVERY DAY 09/13/22   [name], DO  PARoxetine (PAXIL) 30 MG tablet TAKE 1 TABLET BY MOUTH EVERY DAY 09/13/22   Wendling, Jilda Roche, DO  potassium chloride (KLOR-CON) 10 MEQ tablet Take 1 tablet (10 mEq total) by mouth daily. 09/22/22   Georgeanna Lea, MD  sacubitril-valsartan (ENTRESTO) 24-26 MG Take 1  tablet by mouth 2 (two) times daily. 09/22/22   Georgeanna Lea, MD  metoprolol succinate (TOPROL-XL) 25 MG 24 hr tablet TAKE 1 TABLET BY MOUTH ONCE DAILY 12/18/19 05/18/20  Georgeanna Lea, MD      Allergies    Codeine and Niacin    Review of Systems   Review of Systems  Respiratory:  Positive for cough.     Physical Exam Updated Vital Signs BP 105/83   Pulse 89   Temp 97.6 F (36.4 C) (Oral)   Resp 16   Ht 1.803 m (5\' 11" )   Wt 92.1 kg   SpO2 96%   BMI 28.31 kg/m  Physical Exam Vitals and nursing note  reviewed.  Constitutional:      General: He is not in acute distress.    Appearance: He is well-developed.  HENT:     Head: Normocephalic and atraumatic.     Right Ear: External ear normal.     Left Ear: External ear normal.  Eyes:     General: No scleral icterus.       Right eye: No discharge.        Left eye: No discharge.     Conjunctiva/sclera: Conjunctivae normal.  Neck:     Trachea: No tracheal deviation.  Cardiovascular:     Rate and Rhythm: Regular rhythm. Tachycardia present.  Pulmonary:     Effort: Pulmonary effort is normal. No respiratory distress.     Breath sounds: No stridor. Rales present. No wheezing.  Abdominal:     General: Bowel sounds are normal. There is no distension.     Palpations: Abdomen is soft.     Tenderness: There is no abdominal tenderness. There is no guarding or rebound.  Musculoskeletal:        General: No tenderness or deformity.     Cervical back: Neck supple.     Right lower leg: Edema present.     Left lower leg: Edema present.  Skin:    General: Skin is warm and dry.     Findings: No rash.  Neurological:     General: No focal deficit present.     Mental Status: He is alert.     Cranial Nerves: No cranial nerve deficit, dysarthria or facial asymmetry.     Sensory: No sensory deficit.     Motor: No abnormal muscle tone or seizure activity.     Coordination: Coordination normal.  Psychiatric:        Mood and Affect: Mood normal.     ED Results / Procedures / Treatments   Labs (all labs ordered are listed, but only abnormal results are displayed) Labs Reviewed  BASIC METABOLIC PANEL - Abnormal; Notable for the following components:      Result Value   Glucose, Bld 125 (*)    All other components within normal limits  BRAIN NATRIURETIC PEPTIDE - Abnormal; Notable for the following components:   B Natriuretic Peptide 598.3 (*)    All other components within normal limits  CBC - Abnormal; Notable for the following components:    RBC 4.19 (*)    Hemoglobin 12.8 (*)    All other components within normal limits  MAGNESIUM    EKG EKG Interpretation  Date/Time:  Monday Nov 21 2022 10:39:49 EDT Ventricular Rate:  114 PR Interval:    QRS Duration: 108 QT Interval:  350 QTC Calculation: 482 R Axis:   137 Text Interpretation: Atrial flutter Incomplete right bundle branch block Left posterior fascicular  block Possible Anterior infarct , age undetermined Abnormal ECG When compared with ECG of 02-Nov-2022 09:17, PREVIOUS ECG IS PRESENT atrial flutter new since last tracing Confirmed by Linwood Dibbles [phone]) on 11/21/2022 11:02:15 AM  Radiology DG Chest Portable 1 View  Result Date: 11/21/2022 CLINICAL DATA:  Worsening shortness of breath. Dyspnea and chest discomfort. EXAM: PORTABLE CHEST 1 VIEW COMPARISON:  11/09/2022 FINDINGS: Stable mild cardiomegaly. No evidence of acute infiltrate or pleural effusion. IMPRESSION: Mild cardiomegaly. No active lung disease. Electronically Signed   By: Danae Orleans M.D.   On: 11/21/2022 11:38    Procedures Procedures  {Document cardiac monitor, telemetry assessment procedure when appropriate:1}  Medications Ordered in ED Medications  furosemide (LASIX) injection 40 mg (40 mg Intravenous Given 11/21/22 1320)    ED Course/ Medical Decision Making/ A&P Clinical Course as of 11/21/22 1349  Mon Nov 21, 2022  1143 Chest x-ray shows mild cardiomegaly.  No active disease [JK]  1144 Brain natriuretic peptide (order ONLY if patient c/o SOB)(!) BNP slightly increased compared to previous [JK]  1144 Basic metabolic panel(!) Metabolic panel normal [JK]  1144 CBC(!) CBC normal [JK]  1320 Reviewed case with Dr. Rudene Anda.  We will go ahead and give a dose of Lasix in the ED.  She will come and evaluate the patient. [JK]    Clinical Course User Index [JK] Linwood Dibbles, MD   {   Click here for ABCD2, HEART and other calculatorsREFRESH Note before signing :1}                          Medical  Decision Making Parental diagnosis includes but not limited to pneumonia pneumothorax, CHF exacerbation, GERD contributing to chronic cough  Problems Addressed: Atrial flutter, unspecified type [facility]): chronic illness or injury with exacerbation, progression, or side effects of treatment Congestive heart failure, unspecified HF chronicity, unspecified heart failure type [facility]): chronic illness or injury with exacerbation, progression, or side effects of treatment  Amount and/or Complexity of Data Reviewed Labs: ordered. Decision-making details documented in ED Course. Radiology: ordered and independent interpretation performed. Discussion of management or test interpretation with external provider(s): Case discussed with ED  Risk Prescription drug management.   Patient has had issues with persistent cough especially when he is lying flat.  Patient also has new peripheral edema.  Presentation suggestive of CHF exacerbation.  Patient without signs of pulmonary edema on chest x-ray.  His labs are otherwise reassuring without signs of renal failure or severe anemia.  Patient is supposed to have a cardioversion procedure tomorrow.  Reviewed the case with cardiology Dr. Doreene Adas.  Patient was given the option of discharge home after dose of IV Lasix versus admission to the hospital for optimization.  Pt would prefer to go home.  Will have him increase his lasix to 40 mg daily.  {Document critical care time when appropriate:1} {Document review of labs and clinical decision tools ie heart score, Chads2Vasc2 etc:1}  {Document your independent review of radiology images, and any outside records:1} {Document your discussion with family members, caretakers, and with consultants:1} {Document social determinants of health affecting pt's care:1} {Document your decision making why or why not admission, treatments were needed:1} Final Clinical Impression(s) / ED Diagnoses Final diagnoses:  Atrial flutter,  unspecified type (HCC)  Congestive heart failure, unspecified HF chronicity, unspecified heart failure type (HCC)    Rx / DC Orders ED Discharge Orders     None

## 2022-11-21 NOTE — Anesthesia Preprocedure Evaluation (Signed)
Anesthesia Evaluation  Patient identified by MRN, date of birth, ID band Patient awake    Reviewed: Allergy & Precautions, H&P , NPO status , Patient's Chart, lab work & pertinent test results  Airway Mallampati: II  TM Distance: >3 FB Neck ROM: Full    Dental no notable dental hx.    Pulmonary neg pulmonary ROS, former smoker   Pulmonary exam normal breath sounds clear to auscultation       Cardiovascular hypertension, Pt. on medications + dysrhythmias Atrial Fibrillation  Rhythm:Irregular Rate:Normal + Systolic murmurs Echo 09/29/22: 1. Atrial fibrillation. Left ventricular ejection fraction, by  estimation, is 35 to 40%. Left ventricular ejection fraction by 2D MOD  biplane is 40.6 %. The left ventricle has moderately decreased function.  The left ventricle demonstrates global  hypokinesis. There is mild concentric left ventricular hypertrophy. Left  ventricular diastolic parameters are indeterminate.   2. Right ventricular systolic function is moderately reduced. The right  ventricular size is normal. There is mildly elevated pulmonary artery  systolic pressure.   3. Left atrial size was severely dilated.   4. The mitral valve is degenerative. Moderate mitral valve regurgitation.  No evidence of mitral stenosis.   5. Tricuspid valve regurgitation is mild to moderate.   6. The aortic valve is tricuspid. Aortic valve regurgitation is mild.  Aortic valve sclerosis is present, with no evidence of aortic valve  stenosis.   7. Aortic Normal DTA. There is borderline dilatation of the ascending  aorta, measuring 38 mm.   8. The inferior vena cava is normal in size with greater than 50%  respiratory variability, suggesting right atrial pressure of 3 mmHg.     Neuro/Psych  PSYCHIATRIC DISORDERS Anxiety Depression     Neuromuscular disease  negative psych ROS   GI/Hepatic negative GI ROS, Neg liver ROS,GERD  Medicated,,   Endo/Other  Hypothyroidism    Renal/GU negative Renal ROS  negative genitourinary   Musculoskeletal negative musculoskeletal ROS (+)    Abdominal   Peds negative pediatric ROS (+)  Hematology  (+) Blood dyscrasia (Eliquis), anemia   Anesthesia Other Findings   Reproductive/Obstetrics negative OB ROS                             Anesthesia Physical Anesthesia Plan  ASA: 4  Anesthesia Plan: General   Post-op Pain Management: Minimal or no pain anticipated   Induction: Intravenous  PONV Risk Score and Plan: 2 and TIVA and Treatment may vary due to age or medical condition  Airway Management Planned: Mask  Additional Equipment:   Intra-op Plan:   Post-operative Plan: Extubation in OR  Informed Consent: I have reviewed the patients History and Physical, chart, labs and discussed the procedure including the risks, benefits and alternatives for the proposed anesthesia with the patient or authorized representative who has indicated his/her understanding and acceptance.     Dental advisory given  Plan Discussed with: CRNA and Surgeon  Anesthesia Plan Comments:         Anesthesia Quick Evaluation

## 2022-11-21 NOTE — ED Triage Notes (Addendum)
Patient reports suppose to have a cardiac ablation tomorrow but has had a cough that is keeping him up all night and is on antibiotics for bronchitis and wanted to make sure nothing was going on that he couldn't get the ablation. Reports insomnia due to the cough.  Reports he is on lasix but not voiding like normal and his legs are swollen.  Patient reports cough is worse with lying and talking.

## 2022-11-22 ENCOUNTER — Other Ambulatory Visit (HOSPITAL_BASED_OUTPATIENT_CLINIC_OR_DEPARTMENT_OTHER): Payer: Self-pay

## 2022-11-22 ENCOUNTER — Other Ambulatory Visit: Payer: Self-pay | Admitting: Cardiology

## 2022-11-22 ENCOUNTER — Telehealth: Payer: Self-pay

## 2022-11-22 DIAGNOSIS — I4819 Other persistent atrial fibrillation: Secondary | ICD-10-CM

## 2022-11-22 NOTE — Telephone Encounter (Signed)
Spoke with pt. He stated that he was feeling the best today he had felt in a while. He stated that the increased dose of Lasix has helped. His Cardioversion is planned for Thursday. Encouraged to call if he has any questions or needs.

## 2022-11-23 ENCOUNTER — Other Ambulatory Visit (HOSPITAL_BASED_OUTPATIENT_CLINIC_OR_DEPARTMENT_OTHER): Payer: Self-pay

## 2022-11-23 MED ORDER — DILTIAZEM HCL 30 MG PO TABS
30.0000 mg | ORAL_TABLET | Freq: Four times a day (QID) | ORAL | 0 refills | Status: DC
  Filled 2022-11-23: qty 120, 30d supply, fill #0

## 2022-11-23 NOTE — Pre-Procedure Instructions (Addendum)
Spoke with patient regarding procedure instructions for tomorrow.  Nothing to eat or drink after midnight.  Someone to drive you home after procedure and stay with you for 24 hours.  Takes Eliquis twice a day, hasn't missed any doses.  Take dose in the morning.  States breathing is still doing good.  Patient did call back,  he will come in at 8:00 for 9:00

## 2022-11-24 ENCOUNTER — Other Ambulatory Visit: Payer: Self-pay

## 2022-11-24 ENCOUNTER — Ambulatory Visit (HOSPITAL_COMMUNITY)
Admission: RE | Admit: 2022-11-24 | Discharge: 2022-11-24 | Disposition: A | Discharge status: Home / Self Care | Payer: Medicare Other | Attending: Cardiology | Admitting: Cardiology

## 2022-11-24 ENCOUNTER — Encounter (HOSPITAL_COMMUNITY)
Admission: RE | Disposition: A | Discharge status: Home / Self Care | Payer: Self-pay | Source: Home / Self Care | Attending: Cardiology

## 2022-11-24 ENCOUNTER — Ambulatory Visit (HOSPITAL_COMMUNITY): Payer: Medicare Other | Admitting: Anesthesiology

## 2022-11-24 ENCOUNTER — Encounter (HOSPITAL_COMMUNITY): Payer: Self-pay | Admitting: Cardiology

## 2022-11-24 ENCOUNTER — Ambulatory Visit (HOSPITAL_BASED_OUTPATIENT_CLINIC_OR_DEPARTMENT_OTHER): Payer: Medicare Other | Admitting: Anesthesiology

## 2022-11-24 ENCOUNTER — Encounter (HOSPITAL_COMMUNITY): Payer: Self-pay

## 2022-11-24 DIAGNOSIS — E039 Hypothyroidism, unspecified: Secondary | ICD-10-CM | POA: Diagnosis not present

## 2022-11-24 DIAGNOSIS — E785 Hyperlipidemia, unspecified: Secondary | ICD-10-CM | POA: Diagnosis not present

## 2022-11-24 DIAGNOSIS — I5020 Unspecified systolic (congestive) heart failure: Secondary | ICD-10-CM | POA: Diagnosis not present

## 2022-11-24 DIAGNOSIS — I4891 Unspecified atrial fibrillation: Secondary | ICD-10-CM

## 2022-11-24 DIAGNOSIS — I119 Hypertensive heart disease without heart failure: Secondary | ICD-10-CM | POA: Diagnosis not present

## 2022-11-24 DIAGNOSIS — I4819 Other persistent atrial fibrillation: Secondary | ICD-10-CM | POA: Insufficient documentation

## 2022-11-24 DIAGNOSIS — I4892 Unspecified atrial flutter: Secondary | ICD-10-CM | POA: Diagnosis not present

## 2022-11-24 DIAGNOSIS — Z87891 Personal history of nicotine dependence: Secondary | ICD-10-CM | POA: Insufficient documentation

## 2022-11-24 DIAGNOSIS — D638 Anemia in other chronic diseases classified elsewhere: Secondary | ICD-10-CM | POA: Diagnosis not present

## 2022-11-24 DIAGNOSIS — I110 Hypertensive heart disease with heart failure: Secondary | ICD-10-CM | POA: Insufficient documentation

## 2022-11-24 HISTORY — PX: CARDIOVERSION: SHX1299

## 2022-11-24 SURGERY — CARDIOVERSION
Anesthesia: General

## 2022-11-24 MED ORDER — SODIUM CHLORIDE 0.9 % IV SOLN: INTRAVENOUS | Status: DC | PRN

## 2022-11-24 MED ORDER — SODIUM CHLORIDE 0.9 % IV SOLN: INTRAVENOUS | Status: DC

## 2022-11-24 MED ORDER — PROPOFOL 10 MG/ML IV BOLUS
INTRAVENOUS | Status: DC | PRN
  Administered 2022-11-24: 80 mg via INTRAVENOUS

## 2022-11-24 MED ORDER — PHENYLEPHRINE 80 MCG/ML (10ML) SYRINGE FOR IV PUSH (FOR BLOOD PRESSURE SUPPORT)
PREFILLED_SYRINGE | INTRAVENOUS | Status: DC | PRN
  Administered 2022-11-24 (×3): 80 ug via INTRAVENOUS

## 2022-11-24 MED ORDER — LIDOCAINE 2% (20 MG/ML) 5 ML SYRINGE
INTRAMUSCULAR | Status: DC | PRN
  Administered 2022-11-24: 100 mg via INTRAVENOUS

## 2022-11-24 SURGICAL SUPPLY — 1 items: ELECT DEFIB PAD ADLT CADENCE (PAD) ×1 IMPLANT

## 2022-11-24 NOTE — CV Procedure (Signed)
    Electrical Cardioversion Procedure Note Darius Mitchell [id number] [birth year]  Procedure: Electrical Cardioversion Indications:  Atrial Fibrillation  Time Out: Verified patient identification, verified procedure,medications/allergies/relevent history reviewed, required imaging and test results available.  Performed  Procedure Details  The patient was NPO after midnight. Anesthesia was administered at the beside  by Dr.Rose with propofol.  Cardioversion was performed with synchronized biphasic defibrillation via AP pads with 200 joules.  1 attempt(s) were performed.  The patient converted to normal sinus rhythm. The patient tolerated the procedure well   IMPRESSION:  Successful cardioversion of atrial fibrillation    Darius Mitchell 11/24/2022, 8:55 AM

## 2022-11-24 NOTE — Transfer of Care (Signed)
Immediate Anesthesia Transfer of Care Note  Patient: Darius Mitchell  Procedure(s) Performed: CARDIOVERSION  Patient Location: PACU, ICU, and Cath Lab  Anesthesia Type:General  Level of Consciousness: drowsy and patient cooperative  Airway & Oxygen Therapy: Patient Spontanous Breathing and Patient connected to nasal cannula oxygen  Post-op Assessment: Report given to RN and Post -op Vital signs reviewed and stable  Post vital signs: Reviewed and stable  Last Vitals:  Vitals Value Taken Time  BP    Temp    Pulse    Resp    SpO2      Last Pain:  Vitals:   11/24/22 0807  TempSrc:   PainSc: 0-No pain         Complications: No notable events documented.

## 2022-11-24 NOTE — Anesthesia Procedure Notes (Signed)
Procedure Name: General with mask airway Date/Time: 11/24/2022 8:48 AM  Performed by: Adria Dill, CRNAPre-anesthesia Checklist: Patient identified, Emergency Drugs available, Suction available and Patient being monitored Patient Re-evaluated:Patient Re-evaluated prior to induction Oxygen Delivery Method: Nasal cannula Preoxygenation: Pre-oxygenation with 100% oxygen Induction Type: IV induction Placement Confirmation: positive ETCO2 and breath sounds checked- equal and bilateral Dental Injury: Teeth and Oropharynx as per pre-operative assessment

## 2022-11-24 NOTE — Interval H&P Note (Signed)
History and Physical Interval Note:  11/24/2022 8:39 AM  Darius Mitchell  has presented today for surgery, with the diagnosis of AFIB.  The various methods of treatment have been discussed with the patient and family. After consideration of risks, benefits and other options for treatment, the patient has consented to  Procedure(s): CARDIOVERSION (N/A) as a surgical intervention.  The patient's history has been reviewed, patient examined, no change in status, stable for surgery.  I have reviewed the patient's chart and labs.  Questions were answered to the patient's satisfaction.     [employer]

## 2022-11-24 NOTE — Anesthesia Postprocedure Evaluation (Signed)
Anesthesia Post Note  Patient: [facility]  Procedure(s) Performed: CARDIOVERSION     Patient location during evaluation: PACU Anesthesia Type: General Level of consciousness: awake and alert Pain management: pain level controlled Vital Signs Assessment: post-procedure vital signs reviewed and stable Respiratory status: spontaneous breathing, nonlabored ventilation, respiratory function stable and patient connected to nasal cannula oxygen Cardiovascular status: blood pressure returned to baseline and stable Postop Assessment: no apparent nausea or vomiting Anesthetic complications: no  No notable events documented.  Last Vitals:  Vitals:   11/24/22 0810 11/24/22 0905  BP:    Pulse: (!) 116   Resp: (!) 22   Temp:  36.6 C  SpO2: 99%     Last Pain:  Vitals:   11/24/22 0905  TempSrc: Temporal  PainSc: 0-No pain                 Lillion Elbert S

## 2022-11-24 NOTE — Discharge Instructions (Signed)

## 2022-11-25 ENCOUNTER — Encounter (HOSPITAL_COMMUNITY): Payer: Self-pay | Admitting: Cardiology

## 2022-11-25 ENCOUNTER — Telehealth: Payer: Self-pay

## 2022-11-25 NOTE — Telephone Encounter (Signed)
Transition Care Management Unsuccessful Follow-up Telephone Call  Date of discharge and from where:  11/21/2022 [facility]  Attempts:  1st Attempt  Reason for unsuccessful TCM follow-up call:  Left voice message  Temitope Flammer [facility]  [facility] Population Health Community Resource Care Guide   [email or web address]  ?? [phone]   Website: [email or web address]  [email or web address]

## 2022-11-28 ENCOUNTER — Encounter: Payer: Self-pay | Admitting: Cardiology

## 2022-11-29 ENCOUNTER — Telehealth: Payer: Self-pay

## 2022-11-29 NOTE — Telephone Encounter (Signed)
Transition Care Management Follow-up Telephone Call Date of discharge and from where: 11/21/2022 [facility] How have you been since you were released from the hospital? Patient stated that his heart is still beating fast. Any questions or concerns? No  Items Reviewed: Did the pt receive and understand the discharge instructions provided? Yes  Medications obtained and verified? Yes  Other? No  Any new allergies since your discharge? No  Dietary orders reviewed? Yes Do you have support at home? Yes   Follow up appointments reviewed:  PCP Hospital f/u appt confirmed? No  Scheduled to see  on  @ . Specialist Hospital f/u appt confirmed? Yes  Scheduled to see Lake Bells, PA-C on 12/01/2022 @ [facility] Are transportation arrangements needed? No  If their condition worsens, is the pt aware to call PCP or go to the Emergency Dept.? Yes Was the patient provided with contact information for the PCP's office or ED? Yes Was to pt encouraged to call back with questions or concerns? Yes  Esaul Dorwart [facility]  [facility] Population Health Community Resource Care Guide   [email or web address]  ?? [phone]   Website: [email or web address]  [email or web address]

## 2022-12-01 ENCOUNTER — Encounter (HOSPITAL_COMMUNITY): Payer: Self-pay | Admitting: Internal Medicine

## 2022-12-01 ENCOUNTER — Ambulatory Visit (HOSPITAL_COMMUNITY)
Admission: RE | Admit: 2022-12-01 | Discharge: 2022-12-01 | Disposition: A | Discharge status: Home / Self Care | Payer: Medicare Other | Source: Ambulatory Visit | Attending: Internal Medicine | Admitting: Internal Medicine

## 2022-12-01 ENCOUNTER — Encounter: Payer: Self-pay | Admitting: Cardiology

## 2022-12-01 VITALS — BP 118/74 | HR 95 | Ht 71.0 in | Wt 190.4 lb

## 2022-12-01 DIAGNOSIS — D6869 Other thrombophilia: Secondary | ICD-10-CM | POA: Diagnosis not present

## 2022-12-01 DIAGNOSIS — I483 Typical atrial flutter: Secondary | ICD-10-CM

## 2022-12-01 DIAGNOSIS — Z79899 Other long term (current) drug therapy: Secondary | ICD-10-CM | POA: Diagnosis not present

## 2022-12-01 DIAGNOSIS — E039 Hypothyroidism, unspecified: Secondary | ICD-10-CM | POA: Diagnosis not present

## 2022-12-01 DIAGNOSIS — D6859 Other primary thrombophilia: Secondary | ICD-10-CM | POA: Diagnosis not present

## 2022-12-01 DIAGNOSIS — E785 Hyperlipidemia, unspecified: Secondary | ICD-10-CM | POA: Insufficient documentation

## 2022-12-01 DIAGNOSIS — I4819 Other persistent atrial fibrillation: Secondary | ICD-10-CM | POA: Diagnosis not present

## 2022-12-01 DIAGNOSIS — I4892 Unspecified atrial flutter: Secondary | ICD-10-CM | POA: Diagnosis not present

## 2022-12-01 LAB — COMPREHENSIVE METABOLIC PANEL
ALT: 34 U/L (ref 0–44)
AST: 35 U/L (ref 15–41)
Albumin: 3.8 g/dL (ref 3.5–5.0)
Alkaline Phosphatase: 69 U/L (ref 38–126)
Anion gap: 8 (ref 5–15)
BUN: 21 mg/dL (ref 8–23)
CO2: 29 mmol/L (ref 22–32)
Calcium: 9.5 mg/dL (ref 8.9–10.3)
Chloride: 105 mmol/L (ref 98–111)
Creatinine, Ser: 1.49 mg/dL — ABNORMAL HIGH (ref 0.61–1.24)
GFR, Estimated: 49 mL/min — ABNORMAL LOW (ref >60)
Glucose, Bld: 87 mg/dL (ref 70–99)
Potassium: 4.5 mmol/L (ref 3.5–5.1)
Sodium: 142 mmol/L (ref 135–145)
Total Bilirubin: 1.2 mg/dL (ref 0.3–1.2)
Total Protein: 7.2 g/dL (ref 6.5–8.1)

## 2022-12-01 LAB — CBC
HCT: 41 % (ref 39.0–52.0)
Hemoglobin: 13.2 g/dL (ref 13.0–17.0)
MCH: 31.7 pg (ref 26.0–34.0)
MCHC: 32.2 g/dL (ref 30.0–36.0)
MCV: 98.3 fL (ref 80.0–100.0)
Platelets: 154 10*3/uL (ref 150–400)
RBC: 4.17 MIL/uL — ABNORMAL LOW (ref 4.22–5.81)
RDW: 15 % (ref 11.5–15.5)
WBC: 4.5 10*3/uL (ref 4.0–10.5)
nRBC: 0 % (ref 0.0–0.2)

## 2022-12-01 LAB — TSH: TSH: 7.321 u[IU]/mL — ABNORMAL HIGH (ref 0.350–4.500)

## 2022-12-01 NOTE — Patient Instructions (Signed)
Cardioversion scheduled for: June 28th 2024   - Arrive at the [facility] and go to admitting at 10:00am   - Do not eat or drink anything after midnight the night prior to your procedure.   - Take all your morning medication (except diabetic medications) with a sip of water prior to arrival.  - You will not be able to drive home after your procedure.    - Do NOT miss any doses of your blood thinner - if you should miss a dose please notify our office immediately.   - If you feel as if you go back into normal rhythm prior to scheduled cardioversion, please notify our office immediately.   If your procedure is canceled in the cardioversion suite you will be charged a cancellation fee.      For those patients who have a scheduled procedure/anesthesia on the same day of the week as their dose, hold the medication on the day of surgery.  They can take their scheduled dose the week before.  **Patients on the above medications scheduled for elective procedures that have not held the medication for the appropriate amount of time are at risk of cancellation or change in the anesthetic plan.

## 2022-12-01 NOTE — Progress Notes (Addendum)
Primary Care Physician: [name], DO Primary Cardiologist: Dr. Bing Matter Primary Electrophysiologist: Dr. Elberta Fortis Referring Physician: Dr. [name] is a 72 y.o. male with a history of HLD, HTN, hypothyroidism, and persistent atrial fibrillation who presents for consultation in the [facility]. The patient underwent most recently successful cardioversions on 11/02/22 and 11/24/22 both while on amiodarone 200 mg BID. He contacted office noting early return of atrial flutter on 11/28/22. Patient is on Eliquis 5 mg BID for a CHADS2VASC score of 3.  On follow up today, he is currently in rate controlled atrial flutter. He notes at home his HR will fluctuate and be above 100 bpm. He actually feels great and does not have any symptoms at this time. He is still active. He does note his BP is low at times (asymptomatic) and has had home readings of 90/60. He takes diltiazem prn when he notes his HR is elevated and BP is stable.  He is compliant with anticoagulation and has not missed any doses. He has no bleeding concerns.  Today, he denies symptoms of palpitations, chest pain, shortness of breath, orthopnea, PND, lower extremity edema, dizziness, presyncope, syncope, snoring, daytime somnolence, bleeding, or neurologic sequela. The patient is tolerating medications without difficulties and is otherwise without complaint today.   he has a BMI of Body mass index is 26.56 kg/m.Marland Kitchen Filed Weights   12/01/22 1032  Weight: 86.4 kg    Family History  Problem Relation Age of Onset   Stroke Mother 100       Deceased   Heart attack Mother    Lymphoma Father 58       Deceased   Heart disease Father    Stroke Maternal Grandmother    Stomach cancer Paternal Grandfather    Healthy Brother        x2   Healthy Sister    Hypertension Daughter    Healthy Daughter      Atrial Fibrillation Management history:  Previous antiarrhythmic drugs:  amiodarone 200 mg BID Previous cardioversions: 11/02/22, 11/24/22 Previous ablations: None Anticoagulation history: Eliquis 5 mg BID   Past Medical History:  Diagnosis Date   Afib (HCC)    Anxiety and depression 03/25/2014   BMI 30.0-30.9,adult 05/15/2015   Carpal tunnel syndrome of right wrist 10/23/2015   Diverticulosis of colon without hemorrhage 03/17/2016   Essential hypertension, benign 03/25/2014   GERD (gastroesophageal reflux disease)    Hyperlipidemia    Hypothyroidism    Low testosterone 05/15/2015   Paroxysmal atrial fibrillation (HCC) 12/03/2018   Prostate cancer screening encounter, options and risks discussed 04/30/2014   Screening for ischemic heart disease 04/30/2014   Swelling of both lower extremities 11/29/2017   Visit for preventive health examination 04/30/2014   Past Surgical History:  Procedure Laterality Date   APPENDECTOMY  1971   CARDIOVERSION N/A 11/02/2022   Procedure: CARDIOVERSION;  Surgeon: Thomasene Ripple, DO;  Location: [facility] INVASIVE CV LAB;  Service: Cardiovascular;  Laterality: N/A;   CARDIOVERSION N/A 11/24/2022   Procedure: CARDIOVERSION;  Surgeon: Jake Bathe, MD;  Location: [facility] INVASIVE CV LAB;  Service: Cardiovascular;  Laterality: N/A;   WISDOM TOOTH EXTRACTION  1972    Current Outpatient Medications  Medication Sig Dispense Refill   amiodarone (PACERONE) 200 MG tablet Take 1 tablet (200 mg total) by mouth 2 (two) times daily. 60 tablet 3   apixaban (ELIQUIS) 5 MG TABS tablet Take 1 tablet by mouth 2 (two) times daily.  180 tablet 1   atorvastatin (LIPITOR) 80 MG tablet TAKE 1 TABLET BY MOUTH EVERY DAY 90 tablet 3   diltiazem (CARDIZEM) 30 MG tablet Take 1 tablet (30 mg total) by mouth 4 (four) times daily. For heart rate above 100 beats per minute in AFib. 120 tablet 0   ezetimibe (ZETIA) 10 MG tablet TAKE 1 TABLET BY MOUTH EVERY DAY 90 tablet 3   furosemide (LASIX) 20 MG tablet Take 1 tablet (20 mg total) by mouth daily. (Patient taking  differently: Take 20 mg by mouth 2 (two) times daily.) 90 tablet 3   Glucosamine-Chondroit-Biofl-Mn (GLUCOSAMINE CHONDROIT,BIOFLAV,) CAPS Take 1 tablet by mouth in the morning.     levothyroxine (SYNTHROID) 125 MCG tablet TAKE 1 TABLET BY MOUTH ONCE DAILY BEFORE BREAKFAST 90 tablet 1   Multiple Vitamins-Minerals (CENTRUM SILVER ULTRA MENS) TABS Take 1 tablet by mouth in the morning.     omeprazole (PRILOSEC) 40 MG capsule TAKE 1 CAPSULE BY MOUTH EVERY DAY 90 capsule 3   PARoxetine (PAXIL) 30 MG tablet TAKE 1 TABLET BY MOUTH EVERY DAY 90 tablet 3   potassium chloride (KLOR-CON) 10 MEQ tablet Take 1 tablet (10 mEq total) by mouth daily. 90 tablet 3   sacubitril-valsartan (ENTRESTO) 24-26 MG Take 1 tablet by mouth 2 (two) times daily. 60 tablet 6   benzonatate (TESSALON) 200 MG capsule Take 1 capsule (200 mg total) by mouth 2 (two) times daily as needed for cough. 20 capsule 0   No current facility-administered medications for this encounter.    Allergies  Allergen Reactions   Codeine Nausea Only    GI Intolerance   Niacin Other (See Comments)    Flushing    Social History   Socioeconomic History   Marital status: Married    Spouse name: Not on file   Number of children: Not on file   Years of education: Not on file   Highest education level: Bachelor's degree (e.g., BA, AB, BS)  Occupational History   Not on file  Tobacco Use   Smoking status: Former    Packs/day: 0.25    Years: 25.00    Additional pack years: 0.00    Total pack years: 6.25    Types: Cigarettes    Quit date: 05/28/2019    Years since quitting: 3.5   Smokeless tobacco: Never  Vaping Use   Vaping Use: Never used  Substance and Sexual Activity   Alcohol use: Never   Drug use: Never   Sexual activity: Not on file  Other Topics Concern   Not on file  Social History Narrative   Not on file   Social Determinants of Health   Financial Resource Strain: Low Risk  (11/06/2022)   Overall Financial Resource  Strain (CARDIA)    Difficulty of Paying Living Expenses: Not hard at all  Food Insecurity: No Food Insecurity (11/06/2022)   Hunger Vital Sign    Worried About Running Out of Food in the Last Year: Never true    Ran Out of Food in the Last Year: Never true  Transportation Needs: No Transportation Needs (11/06/2022)   PRAPARE - Administrator, Civil Service (Medical): No    Lack of Transportation (Non-Medical): No  Physical Activity: Sufficiently Active (11/06/2022)   Exercise Vital Sign    Days of Exercise per Week: 4 days    Minutes of Exercise per Session: 150+ min  Stress: Stress Concern Present (11/06/2022)   [employer] of Occupational Health - Occupational Stress  Questionnaire    Feeling of Stress : To some extent  Social Connections: Moderately Integrated (11/06/2022)   Social Connection and Isolation Panel [NHANES]    Frequency of Communication with Friends and Family: Three times a week    Frequency of Social Gatherings with Friends and Family: Twice a week    Attends Religious Services: 1 to 4 times per year    Active Member of [employer] or Organizations: No    Attends Engineer, structural: Not on file    Marital Status: Married  Catering manager Violence: Not on file    ROS- All systems are reviewed and negative except as per the HPI above.  Physical Exam: Vitals:   12/01/22 1032  BP: 118/74  Pulse: 95  Weight: 86.4 kg  Height: 5\' 11"  (1.803 m)   GEN- The patient is a well appearing male, alert and oriented x 3 today.   Head- normocephalic, atraumatic Eyes-  Sclera clear, conjunctiva pink Ears- hearing intact Oropharynx- clear Neck- supple  Lungs- Clear to ausculation bilaterally, normal work of breathing Heart- Irregular rate and rhythm, no murmurs, rubs or gallops  GI- soft, NT, ND, + BS Extremities- no clubbing, cyanosis, or edema MS- no significant deformity or atrophy Skin- no rash or lesion Psych- euthymic mood, full affect Neuro-  strength and sensation are intact  Wt Readings from Last 3 Encounters:  12/01/22 86.4 kg  11/24/22 87.1 kg  11/21/22 92.1 kg   EKG today demonstrates  Vent. rate 95 BPM PR interval * ms QRS duration 110 ms QT/QTcB 434/545 ms P-R-T axes 88 48 60 Atrial flutter with variable A-V block Nonspecific ST abnormality Prolonged QT Abnormal ECG When compared with ECG of 24-Nov-2022 08:53, PREVIOUS ECG IS PRESENT  Echo 09/29/22 demonstrated:  1. Atrial fibrillation. Left ventricular ejection fraction, by  estimation, is 35 to 40%. Left ventricular ejection fraction by 2D MOD  biplane is 40.6 %. The left ventricle has moderately decreased function.  The left ventricle demonstrates global  hypokinesis. There is mild concentric left ventricular hypertrophy. Left  ventricular diastolic parameters are indeterminate.   2. Right ventricular systolic function is moderately reduced. The right  ventricular size is normal. There is mildly elevated pulmonary artery  systolic pressure.   3. Left atrial size was severely dilated.   4. The mitral valve is degenerative. Moderate mitral valve regurgitation.  No evidence of mitral stenosis.   5. Tricuspid valve regurgitation is mild to moderate.   6. The aortic valve is tricuspid. Aortic valve regurgitation is mild.  Aortic valve sclerosis is present, with no evidence of aortic valve  stenosis.   7. Aortic Normal DTA. There is borderline dilatation of the ascending  aorta, measuring 38 mm.   8. The inferior vena cava is normal in size with greater than 50%  respiratory variability, suggesting right atrial pressure of 3 mmHg.   Epic records are reviewed at length today.  CHA2DS2-VASc Score = 3  The patient's score is based upon: CHF History: 1 HTN History: 1 Diabetes History: 0 Stroke History: 0 Vascular Disease History: 0 Age Score: 1 Gender Score: 0       ASSESSMENT AND PLAN: Persistent Atrial Fibrillation (ICD10:  I48.19) / atrial  flutter The patient's CHA2DS2-VASc score is 3, indicating a 3.2% annual risk of stroke.    He is currently in atrial flutter today.  Discussed case with Dr. Elberta Fortis since patient has had 2 DCCVs on amiodarone 200 mg BID. Recommendation was to offer rate control or  another DCCV in several weeks to allow him to load more on amiodarone 200 mg BID. After discussion with patient, he would like to try one final DCCV in 3 weeks. If he has ERAF, then will proceed with rate control until ablation in October. Patient voiced understanding of plan.  - Schedule DCCV. - F/u 1-2 weeks after DCCV. - Continue amiodarone 200 mg BID. - Diltiazem 30 mg prn HR >100, systolic BP > 100  Secondary Hypercoagulable State (ICD10:  D68.69) The patient is at significant risk for stroke/thromboembolism based upon his CHA2DS2-VASc Score of 3.  Continue Apixaban (Eliquis).  No missed doses.   Follow up 1-2 weeks after DCCV.    Lake Bells, PA-C [facility] [address] [place], [place] [address] [phone] 12/01/2022 11:18 AM

## 2022-12-02 ENCOUNTER — Other Ambulatory Visit: Payer: Self-pay | Admitting: Family Medicine

## 2022-12-02 ENCOUNTER — Other Ambulatory Visit (HOSPITAL_COMMUNITY): Payer: Self-pay | Admitting: *Deleted

## 2022-12-02 DIAGNOSIS — I4819 Other persistent atrial fibrillation: Secondary | ICD-10-CM

## 2022-12-02 LAB — T4, FREE: Free T4: 1.13 ng/dL — ABNORMAL HIGH (ref 0.61–1.12)

## 2022-12-02 MED ORDER — LEVOTHYROXINE SODIUM 100 MCG PO TABS: 100.0000 ug | ORAL_TABLET | Freq: Every day | ORAL | 3 refills | Status: DC

## 2022-12-05 ENCOUNTER — Encounter: Payer: Self-pay | Admitting: Cardiology

## 2022-12-05 ENCOUNTER — Other Ambulatory Visit (HOSPITAL_BASED_OUTPATIENT_CLINIC_OR_DEPARTMENT_OTHER): Payer: Self-pay

## 2022-12-05 ENCOUNTER — Other Ambulatory Visit: Payer: Self-pay

## 2022-12-05 ENCOUNTER — Telehealth: Payer: Self-pay

## 2022-12-05 MED ORDER — FUROSEMIDE 40 MG PO TABS
40.0000 mg | ORAL_TABLET | Freq: Every day | ORAL | 3 refills | Status: DC
  Filled 2022-12-05: qty 90, 90d supply, fill #0

## 2022-12-05 MED ORDER — FUROSEMIDE 40 MG PO TABS: 40.0000 mg | ORAL_TABLET | Freq: Every day | ORAL | 3 refills | Status: DC

## 2022-12-05 NOTE — Telephone Encounter (Signed)
Per Dr. Kirtland Bouchard verbally advise to change dose to 40mg  qd of furosemide as the patient is currently on this frequency and dose. Medication sent to pharmacy on file.

## 2022-12-06 ENCOUNTER — Encounter: Payer: Self-pay | Admitting: Cardiology

## 2022-12-08 ENCOUNTER — Ambulatory Visit (HOSPITAL_COMMUNITY): Payer: Medicare Other | Admitting: Internal Medicine

## 2022-12-10 ENCOUNTER — Encounter: Payer: Self-pay | Admitting: Family Medicine

## 2022-12-12 NOTE — Telephone Encounter (Signed)
That is fine if there is room that day

## 2022-12-14 ENCOUNTER — Other Ambulatory Visit: Payer: Self-pay

## 2022-12-14 DIAGNOSIS — I4819 Other persistent atrial fibrillation: Secondary | ICD-10-CM

## 2022-12-15 DIAGNOSIS — D235 Other benign neoplasm of skin of trunk: Secondary | ICD-10-CM | POA: Diagnosis not present

## 2022-12-15 DIAGNOSIS — L821 Other seborrheic keratosis: Secondary | ICD-10-CM | POA: Diagnosis not present

## 2022-12-15 DIAGNOSIS — L578 Other skin changes due to chronic exposure to nonionizing radiation: Secondary | ICD-10-CM | POA: Diagnosis not present

## 2022-12-15 DIAGNOSIS — L814 Other melanin hyperpigmentation: Secondary | ICD-10-CM | POA: Diagnosis not present

## 2022-12-15 DIAGNOSIS — D485 Neoplasm of uncertain behavior of skin: Secondary | ICD-10-CM | POA: Diagnosis not present

## 2022-12-15 DIAGNOSIS — L570 Actinic keratosis: Secondary | ICD-10-CM | POA: Diagnosis not present

## 2022-12-15 DIAGNOSIS — D225 Melanocytic nevi of trunk: Secondary | ICD-10-CM | POA: Diagnosis not present

## 2022-12-17 ENCOUNTER — Encounter: Payer: Self-pay | Admitting: Cardiology

## 2022-12-17 ENCOUNTER — Encounter: Payer: Self-pay | Admitting: Family Medicine

## 2022-12-18 ENCOUNTER — Encounter: Payer: Self-pay | Admitting: Family Medicine

## 2022-12-19 ENCOUNTER — Other Ambulatory Visit (HOSPITAL_BASED_OUTPATIENT_CLINIC_OR_DEPARTMENT_OTHER): Payer: Self-pay

## 2022-12-19 ENCOUNTER — Other Ambulatory Visit: Payer: Self-pay | Admitting: Family Medicine

## 2022-12-21 ENCOUNTER — Encounter: Payer: Self-pay | Admitting: Cardiology

## 2022-12-23 ENCOUNTER — Encounter (HOSPITAL_COMMUNITY): Admission: RE | Payer: Self-pay | Source: Home / Self Care

## 2022-12-23 ENCOUNTER — Ambulatory Visit (HOSPITAL_COMMUNITY): Admission: RE | Admit: 2022-12-23 | Payer: Medicare Other | Source: Home / Self Care | Admitting: Cardiology

## 2022-12-23 SURGERY — CARDIOVERSION
Anesthesia: General

## 2022-12-26 ENCOUNTER — Other Ambulatory Visit: Payer: Self-pay | Admitting: Cardiology

## 2022-12-26 ENCOUNTER — Ambulatory Visit: Payer: Medicare Other | Attending: Cardiology

## 2022-12-26 ENCOUNTER — Other Ambulatory Visit (HOSPITAL_BASED_OUTPATIENT_CLINIC_OR_DEPARTMENT_OTHER): Payer: Self-pay

## 2022-12-26 DIAGNOSIS — I4819 Other persistent atrial fibrillation: Secondary | ICD-10-CM

## 2022-12-26 LAB — BASIC METABOLIC PANEL
Glucose: 110 mg/dL — ABNORMAL HIGH (ref 70–99)
Sodium: 141 mmol/L (ref 134–144)
eGFR: 53 mL/min/{1.73_m2} — ABNORMAL LOW (ref >59)

## 2022-12-26 LAB — CBC

## 2022-12-27 ENCOUNTER — Encounter: Payer: Self-pay | Admitting: Cardiology

## 2022-12-27 LAB — BASIC METABOLIC PANEL WITH GFR
BUN/Creatinine Ratio: 14 (ref 10–24)
Calcium: 9.4 mg/dL (ref 8.6–10.2)
Chloride: 103 mmol/L (ref 96–106)
Potassium: 4 mmol/L (ref 3.5–5.2)

## 2022-12-27 LAB — BASIC METABOLIC PANEL
BUN: 19 mg/dL (ref 8–27)
CO2: 25 mmol/L (ref 20–29)
Creatinine, Ser: 1.4 mg/dL — ABNORMAL HIGH (ref 0.76–1.27)

## 2022-12-27 LAB — CBC
Hematocrit: 35.8 % — ABNORMAL LOW (ref 37.5–51.0)
Hemoglobin: 12.4 g/dL — ABNORMAL LOW (ref 13.0–17.7)
MCH: 31.6 pg (ref 26.6–33.0)
MCHC: 34.6 g/dL (ref 31.5–35.7)
MCV: 91 fL (ref 79–97)
Platelets: 121 10*3/uL — ABNORMAL LOW (ref 150–450)
RDW: 14.6 % (ref 11.6–15.4)
WBC: 6.2 10*3/uL (ref 3.4–10.8)

## 2022-12-28 NOTE — Telephone Encounter (Signed)
Spoke to pt. States his pulse is staying steady around the 120s.    He was taking PRN Diltiazem 30 mg 4 times per day due to elevated HRs but had to stop it b/c his BP was dropping to low and was symptomatic. Aware I am forwarding this to AFib clinic to follow up on Friday and see if they have some advisement to offer prior to ablation that is scheduled for 7/22. Pt is agreeable to plan

## 2022-12-30 NOTE — Telephone Encounter (Signed)
Left message to call back  

## 2023-01-01 ENCOUNTER — Other Ambulatory Visit: Payer: Self-pay | Admitting: Family Medicine

## 2023-01-01 ENCOUNTER — Emergency Department (HOSPITAL_BASED_OUTPATIENT_CLINIC_OR_DEPARTMENT_OTHER)
Admission: EM | Admit: 2023-01-01 | Discharge: 2023-01-01 | Disposition: A | Discharge status: Home / Self Care | Payer: Medicare Other | Attending: Emergency Medicine | Admitting: Emergency Medicine

## 2023-01-01 ENCOUNTER — Encounter (HOSPITAL_BASED_OUTPATIENT_CLINIC_OR_DEPARTMENT_OTHER): Payer: Self-pay | Admitting: Emergency Medicine

## 2023-01-01 DIAGNOSIS — R0602 Shortness of breath: Secondary | ICD-10-CM | POA: Diagnosis present

## 2023-01-01 DIAGNOSIS — I4892 Unspecified atrial flutter: Secondary | ICD-10-CM | POA: Insufficient documentation

## 2023-01-01 DIAGNOSIS — Z7901 Long term (current) use of anticoagulants: Secondary | ICD-10-CM | POA: Insufficient documentation

## 2023-01-01 DIAGNOSIS — R002 Palpitations: Secondary | ICD-10-CM | POA: Diagnosis not present

## 2023-01-01 LAB — CBC WITH DIFFERENTIAL/PLATELET
Abs Immature Granulocytes: 0.03 10*3/uL (ref 0.00–0.07)
Basophils Absolute: 0 10*3/uL (ref 0.0–0.1)
Basophils Relative: 0 %
Eosinophils Absolute: 0.1 10*3/uL (ref 0.0–0.5)
Eosinophils Relative: 1 %
HCT: 38.4 % — ABNORMAL LOW (ref 39.0–52.0)
Hemoglobin: 12.6 g/dL — ABNORMAL LOW (ref 13.0–17.0)
Immature Granulocytes: 0 %
Lymphocytes Relative: 9 %
Lymphs Abs: 0.8 10*3/uL (ref 0.7–4.0)
MCH: 31.6 pg (ref 26.0–34.0)
MCHC: 32.8 g/dL (ref 30.0–36.0)
MCV: 96.2 fL (ref 80.0–100.0)
Monocytes Absolute: 0.6 10*3/uL (ref 0.1–1.0)
Monocytes Relative: 8 %
Neutro Abs: 6.7 10*3/uL (ref 1.7–7.7)
Neutrophils Relative %: 82 %
Platelets: 138 10*3/uL — ABNORMAL LOW (ref 150–400)
RBC: 3.99 MIL/uL — ABNORMAL LOW (ref 4.22–5.81)
RDW: 16.8 % — ABNORMAL HIGH (ref 11.5–15.5)
WBC: 8.2 10*3/uL (ref 4.0–10.5)
nRBC: 0 % (ref 0.0–0.2)

## 2023-01-01 LAB — TROPONIN I (HIGH SENSITIVITY): Troponin I (High Sensitivity): 21 ng/L — ABNORMAL HIGH (ref <18)

## 2023-01-01 LAB — BASIC METABOLIC PANEL
Anion gap: 12 (ref 5–15)
BUN: 27 mg/dL — ABNORMAL HIGH (ref 8–23)
CO2: 22 mmol/L (ref 22–32)
Calcium: 8.8 mg/dL — ABNORMAL LOW (ref 8.9–10.3)
Chloride: 100 mmol/L (ref 98–111)
Creatinine, Ser: 1.27 mg/dL — ABNORMAL HIGH (ref 0.61–1.24)
GFR, Estimated: 60 mL/min — ABNORMAL LOW (ref >60)
Glucose, Bld: 145 mg/dL — ABNORMAL HIGH (ref 70–99)
Potassium: 4 mmol/L (ref 3.5–5.1)
Sodium: 134 mmol/L — ABNORMAL LOW (ref 135–145)

## 2023-01-01 MED ORDER — SODIUM CHLORIDE 0.9 % IV BOLUS
500.0000 mL | Freq: Once | INTRAVENOUS | Status: AC
  Administered 2023-01-01: 500 mL via INTRAVENOUS

## 2023-01-01 MED ORDER — ETOMIDATE 2 MG/ML IV SOLN
10.0000 mg | Freq: Once | INTRAVENOUS | Status: AC
  Administered 2023-01-01: 10 mg via INTRAVENOUS
  Filled 2023-01-01: qty 10

## 2023-01-01 NOTE — ED Notes (Signed)
Ambulated from lobby to room 9, HR 128 max, SpO2 99%, resp even and unlabored.

## 2023-01-01 NOTE — ED Provider Notes (Addendum)
[place] [facility] Provider Note   CSN: [id number] Arrival date & time: 01/01/23  0730     History  Chief Complaint  Patient presents with   Shortness of Breath    Darius Mitchell is a 72 y.o. male.  Patient is a 72 year old male with previous history of atrial fibrillation, a flutter, and recently sinus tachycardia after multiple cardioversions and plan for ablation by [facility] Scheduled for January 16, 2023.  Established cardiologist is Dr. [name].  Patient presenting to emergency department for continued shortness of breath, potation's, chest pressure, fatigue, and intermittent cough.  He states symptoms are worse while he is trying to sleep. He states he has had all of the symptoms over the last 6 months since his diagnosis of A-fib.  Heart rate on arrival was 110 with an ECG demonstrating sinus tachycardia.  No A-fib or a flutter today.  Patient denies any new symptoms including no fevers, chills, nausea, vomiting, abdominal pain, diarrhea.  He denies a history of dehydration.    The history is provided by the patient. No language interpreter was used.  Shortness of Breath Associated symptoms: cough   Associated symptoms: no abdominal pain, no chest pain, no ear pain, no fever, no rash, no sore throat and no vomiting        Home Medications Prior to Admission medications   Medication Sig Start Date End Date Taking? Authorizing Provider  levothyroxine (SYNTHROID) 100 MCG tablet Take 1 tablet (100 mcg total) by mouth daily. 12/02/22   [name], DO  amiodarone (PACERONE) 200 MG tablet Take 1 tablet (200 mg total) by mouth 2 (two) times daily. 10/04/22   Georgeanna Lea, MD  apixaban (ELIQUIS) 5 MG TABS tablet Take 1 tablet by mouth 2 (two) times daily. 11/02/22 11/02/23  Georgeanna Lea, MD  atorvastatin (LIPITOR) 80 MG tablet TAKE 1 TABLET BY MOUTH EVERY DAY 09/13/22   Wendling, Jilda Roche, DO  diltiazem (CARDIZEM) 30 MG  tablet Take 1 tablet (30 mg total) by mouth 4 (four) times daily. For heart rate above 100 beats per minute in AFib. 11/23/22 12/24/22  Trifan, Kermit Balo, MD  ezetimibe (ZETIA) 10 MG tablet TAKE 1 TABLET BY MOUTH EVERY DAY 09/13/22   Wendling, Jilda Roche, DO  furosemide (LASIX) 40 MG tablet Take 1 tablet (40 mg total) by mouth daily. 12/05/22 03/06/23  Georgeanna Lea, MD  Glucosamine-Chondroit-Biofl-Mn (GLUCOSAMINE CHONDROIT,BIOFLAV,) CAPS Take 1 tablet by mouth in the morning.    [provider]  Multiple Vitamins-Minerals (CENTRUM SILVER ULTRA MENS) TABS Take 1 tablet by mouth in the morning.    [provider]  omeprazole (PRILOSEC) 40 MG capsule TAKE 1 CAPSULE BY MOUTH EVERY DAY 09/13/22   [name], DO  PARoxetine (PAXIL) 30 MG tablet TAKE 1 TABLET BY MOUTH EVERY DAY 09/13/22   Wendling, Jilda Roche, DO  sacubitril-valsartan (ENTRESTO) 24-26 MG Take 1 tablet by mouth 2 (two) times daily. 09/22/22   Georgeanna Lea, MD  metoprolol succinate (TOPROL-XL) 25 MG 24 hr tablet TAKE 1 TABLET BY MOUTH ONCE DAILY 12/18/19 05/18/20  Georgeanna Lea, MD      Allergies    Codeine and Niacin    Review of Systems   Review of Systems  Constitutional:  Positive for fatigue. Negative for chills and fever.  HENT:  Negative for ear pain and sore throat.   Eyes:  Negative for pain and visual disturbance.  Respiratory:  Positive for  cough, chest tightness and shortness of breath.   Cardiovascular:  Negative for chest pain and palpitations.  Gastrointestinal:  Negative for abdominal pain and vomiting.  Genitourinary:  Negative for dysuria and hematuria.  Musculoskeletal:  Negative for arthralgias and back pain.  Skin:  Negative for color change and rash.  Neurological:  Negative for seizures and syncope.  All other systems reviewed and are negative.   Physical Exam Updated Vital Signs BP 101/82   Pulse 82   Temp 97.6 F (36.4 C) (Oral)   Resp 19   Ht 5\' 11"   (1.803 m)   Wt 89 kg   SpO2 96%   BMI 27.37 kg/m  Physical Exam Vitals and nursing note reviewed.  Constitutional:      General: He is not in acute distress.    Appearance: He is well-developed.  HENT:     Head: Normocephalic and atraumatic.  Eyes:     Conjunctiva/sclera: Conjunctivae normal.  Cardiovascular:     Rate and Rhythm: Regular rhythm. Tachycardia present.     Heart sounds: No murmur heard. Pulmonary:     Effort: Pulmonary effort is normal. No respiratory distress.     Breath sounds: Normal breath sounds.  Abdominal:     Palpations: Abdomen is soft.     Tenderness: There is no abdominal tenderness.  Musculoskeletal:        General: No swelling.     Cervical back: Neck supple.  Skin:    General: Skin is warm and dry.     Capillary Refill: Capillary refill takes less than 2 seconds.  Neurological:     Mental Status: He is alert.  Psychiatric:        Mood and Affect: Mood normal.     ED Results / Procedures / Treatments   Labs (all labs ordered are listed, but only abnormal results are displayed) Labs Reviewed  CBC WITH DIFFERENTIAL/PLATELET - Abnormal; Notable for the following components:      Result Value   RBC 3.99 (*)    Hemoglobin 12.6 (*)    HCT 38.4 (*)    RDW 16.8 (*)    Platelets 138 (*)    All other components within normal limits  BASIC METABOLIC PANEL - Abnormal; Notable for the following components:   Sodium 134 (*)    Glucose, Bld 145 (*)    BUN 27 (*)    Creatinine, Ser 1.27 (*)    Calcium 8.8 (*)    GFR, Estimated 60 (*)    All other components within normal limits  TROPONIN I (HIGH SENSITIVITY) - Abnormal; Notable for the following components:   Troponin I (High Sensitivity) 21 (*)    All other components within normal limits    EKG None  Radiology No results found.  Procedures .Sedation  Date/Time: 01/01/2023 12:26 PM  Performed by: Franne Forts, DO Authorized by: Franne Forts, DO   Consent:    Consent obtained:   Written   Consent given by:  Patient   Risks discussed:  Dysrhythmia, inadequate sedation and respiratory compromise necessitating ventilatory assistance and intubation   Alternatives discussed:  Analgesia without sedation Universal protocol:    Immediately prior to procedure, a time out was called: yes     Patient identity confirmed:  Arm band, verbally with patient and hospital-assigned identification number Indications:    Procedure performed:  Cardioversion Pre-sedation assessment:    Time since last food or drink:  4   ASA classification: class 2 - patient with  mild systemic disease     Mallampati score:  II - soft palate, uvula, fauces visible   Neck mobility: normal     Pre-sedation assessments completed and reviewed: airway patency, cardiovascular function, hydration status, mental status, nausea/vomiting, pain level, respiratory function and temperature   Immediate pre-procedure details:    Reviewed: vital signs     Verified: bag valve mask available, emergency equipment available, intubation equipment available, IV patency confirmed, oxygen available, reversal medications available and suction available   Procedure details (see MAR for exact dosages):    Preoxygenation:  Nasal cannula   Sedation:  Etomidate   Intended level of sedation: deep   Intra-procedure monitoring:  Cardiac monitor, continuous pulse oximetry, continuous capnometry, frequent LOC assessments, frequent vital sign checks and blood pressure monitoring   Intra-procedure events: respiratory depression     Intra-procedure management:  Airway repositioning   Total Provider sedation time (minutes):  15 Post-procedure details:    Post-sedation assessment completed:  01/01/2023 11:20 PM   Post-sedation assessments completed and reviewed: airway patency, cardiovascular function, hydration status, mental status, nausea/vomiting, pain level, respiratory function and temperature     Patient is stable for discharge or  admission: yes     Procedure completion:  Tolerated well, no immediate complications .Cardioversion  Date/Time: 01/01/2023 12:29 PM  Performed by: Franne Forts, DO Authorized by: Franne Forts, DO   Consent:    Consent obtained:  Written   Consent given by:  Patient   Alternatives discussed:  No treatment Pre-procedure details:    Cardioversion basis:  Emergent   Rhythm:  Atrial flutter   Electrode placement:  Anterior-posterior Patient sedated: Yes. Refer to sedation procedure documentation for details of sedation.  Attempt one:    Cardioversion mode:  Synchronous   Shock (Joules):  100   Shock outcome:  Conversion to normal sinus rhythm Post-procedure details:    Patient status:  Awake   Patient tolerance of procedure:  Tolerated well, no immediate complications .Critical Care  Performed by: Franne Forts, DO Authorized by: Franne Forts, DO   Critical care provider statement:    Critical care time (minutes):  30   Critical care was necessary to treat or prevent imminent or life-threatening deterioration of the following conditions: aflutter and hypoxia from osa.   Critical care was time spent personally by me on the following activities:  Development of treatment plan with patient or surrogate, discussions with consultants, evaluation of patient's response to treatment, examination of patient, ordering and review of laboratory studies, ordering and review of radiographic studies, ordering and performing treatments and interventions, pulse oximetry, re-evaluation of patient's condition and review of old charts   Care discussed with comment:  Cardiology     Medications Ordered in ED Medications  sodium chloride 0.9 % bolus 500 mL ( Intravenous Stopped 01/01/23 1144)  etomidate (AMIDATE) injection 10 mg (10 mg Intravenous Given 01/01/23 1117)    ED Course/ Medical Decision Making/ A&P Clinical Course as of 01/01/23 1226  Sun Jan 01, 2023  1610 Oxygen saturation decreased  to 79% with a stable wave form while sleeping. [AG]    Clinical Course User Index [AG] Franne Forts, DO                             Medical Decision Making Amount and/or Complexity of Data Reviewed Labs: ordered.  Risk Prescription drug management.   72 year old male with previous history of atrial  fibrillation, a flutter, and recently sinus tachycardia after multiple cardioversions and plan for ablation by [facility] Scheduled for January 16, 2023.  Established cardiologist is Dr. [name].  Patient presenting to emergency department for continued shortness of breath, potation's, chest pressure, fatigue, and intermittent cough.  On exam patient is alert and oriented x 3, no acute distress, afebrile, heart rate of 110 with otherwise stable vital signs.  EKG on 01/01/23 at 7:39AM, as interpreted by myself, demonstrates sinus tachycardia with a rate of 118.  No ST segment elevation or depression.  No T wave inversions.  No A-fib or a flutter.  Throughout emergency department stay patient's pulse oximetry drops to and below 79% multiple times while patient is sleeping.  Concern for obstructive sleep apnea that can be attributing to patient's recent diagnosis of atrial fibrillation, a flutter, and sinus tachycardia.  Patient states his symptoms are worse at night and he feels like he is coughing and choking... I have a feeling he is secondary to hypoxia.  Consult placed to cardiology to look at ECG.   Concerns for Sleep Apnea. Patient has had over 7 events in the ED today in which he falls asleep and his oxygen drops to upper 70's with a proper wave form. No obesity. Neck circumference does not appear to be large, however, not measured in ED. Mallampati score 2.   11:00 AM Cardiologist interpreting ECG as a.flutter and recommending cardioversion. I discussed these findings with patient who is already taking amiodarone 200mg  PO every day. Patient agreeable to cardioversion. Troponins likely from  demand ischemia secondary to prolonged tachycardia.   12:26 PM Cardioversion completed successfully without any acute complications. ECG post cardioversion timed stamped at 11:20AM demonstrated sinus rhythm with a rate of 77bpm.  Patient awake at this time and walking about room. Recommendations for continued care by cardiology and electrophysiologist with ablation scheduled for 01/16/23. Recommend for sleep study. Cardiology stated they will contact the office to order one.   Patient in no distress and overall condition improved here in the ED. Detailed discussions were had with the patient regarding current findings, and need for close f/u with PCP or on call doctor. The patient has been instructed to return immediately if the symptoms worsen in any way for re-evaluation. Patient verbalized understanding and is in agreement with current care plan. All questions answered prior to discharge.         Final Clinical Impression(s) / ED Diagnoses Final diagnoses:  Palpitations  Atrial flutter, unspecified type [facility])    Rx / DC Orders ED Discharge Orders     None         Franne Forts, DO 01/01/23 1230    Edwin Dada P, DO 01/01/23 1230

## 2023-01-01 NOTE — ED Triage Notes (Signed)
Pt reports he is unable to take deep breaths and has been taking shallow breaths. Also reports "congestion in his throat." Also report chest pressue. Has had these symptoms for 6 months. Has ablation scheduled in a few weeks.  Pt also reports he has lost weight and has less appetite.

## 2023-01-01 NOTE — Sedation Documentation (Signed)
Unable to rate pain during the procedure due to sedation 

## 2023-01-01 NOTE — Discharge Instructions (Signed)
-  Please keep your appointment for your ablation procedure on July 22. -Please return to emergency department for any worsening concerning signs or symptoms. -I called your cardiology office who will be setting up an sleep study for you in the next few days.  Please call them in the next 3 days if you do not hear from a sleep study this to schedule an appointment.

## 2023-01-01 NOTE — Sedation Documentation (Signed)
Synchronized shock give. HR slowed to 75.

## 2023-01-01 NOTE — Sedation Documentation (Signed)
EDP in room during procedure until pt close to alert. Respiratory just left room after procedure. Pt alert, oriented and talking to family member.

## 2023-01-01 NOTE — Sedation Documentation (Signed)
Pt starting to wake up

## 2023-01-01 NOTE — ED Notes (Signed)
Desat into 80s on r/a when asleep.  Placed on 2lpm .  No distress noted

## 2023-01-02 ENCOUNTER — Ambulatory Visit: Payer: Medicare Other | Admitting: Family Medicine

## 2023-01-02 ENCOUNTER — Telehealth: Payer: Self-pay | Admitting: Cardiology

## 2023-01-02 ENCOUNTER — Ambulatory Visit (HOSPITAL_COMMUNITY): Payer: Medicare Other | Admitting: Internal Medicine

## 2023-01-02 NOTE — Telephone Encounter (Signed)
Called patient and he reported that his cardioversion was successful, but now he has been having trouble sleeping for the past few weeks. He is requesting medication to help him sleep. Based on his symptoms I recommended that he reach out to his PCP for a prescription for sleep medicine. Patient verbalized understanding and had no further questions at this time.

## 2023-01-02 NOTE — Telephone Encounter (Signed)
New Message:     Wife called. She said patient had to go the ER on yesterday and had a Cardioversion. She said she is very concerned, because patient have not been able to sleep through the night. She says he is like a Transport planner. She says this have been going for almost a month. She would like for Dr Bing Matter to recommend something to help him sleep please.l

## 2023-01-03 ENCOUNTER — Encounter: Payer: Self-pay | Admitting: Family Medicine

## 2023-01-03 ENCOUNTER — Ambulatory Visit (INDEPENDENT_AMBULATORY_CARE_PROVIDER_SITE_OTHER): Payer: Medicare Other | Admitting: Family Medicine

## 2023-01-03 VITALS — BP 118/72 | HR 74 | Temp 97.7°F | Ht 71.0 in | Wt 188.4 lb

## 2023-01-03 DIAGNOSIS — R0681 Apnea, not elsewhere classified: Secondary | ICD-10-CM | POA: Diagnosis not present

## 2023-01-03 DIAGNOSIS — E039 Hypothyroidism, unspecified: Secondary | ICD-10-CM | POA: Diagnosis not present

## 2023-01-03 DIAGNOSIS — R053 Chronic cough: Secondary | ICD-10-CM

## 2023-01-03 DIAGNOSIS — R0600 Dyspnea, unspecified: Secondary | ICD-10-CM | POA: Diagnosis not present

## 2023-01-03 NOTE — Patient Instructions (Addendum)
If you do not hear anything about your referrals in the next 1-2 weeks, call our office and ask for an update.  Give [place] 2-3 business days to get the results of your labs back.   Let us know if you need anything.

## 2023-01-03 NOTE — Progress Notes (Signed)
Chief Complaint  Patient presents with   Sleeping Problem    No taste     Subjective: Patient is a 72 y.o. male here for sleep issues.  Here with his wife.  Over the past several months, the patient has had issues with shortness of breath, throat clearing, coughing, and chest heaviness.  He has had a very thorough cardiac workup.  He has had chest x-rays that have been unrevealing as well.  He has been treated for bronchitis.  Unfortunately, it is affecting his quality of life and he is delegated various activities of daily living to paid workers.  His sleep was previously very poor.  The cardiology team checked his thyroid level which was elevated around a month ago.  His thyroid medication was decreased from 125 mcg daily to 100 mcg daily.  An ER doctor told him that he stops breathing when he sleeps.  His wife also notes that she has heard him breathing and then I will stop at night for several moments.  He has never had a sleep study.  He was recently diagnosed with atrial fibrillation.  He has an ablation on 01/16/2023.  Past Medical History:  Diagnosis Date   Afib (HCC)    Anxiety and depression 03/25/2014   BMI 30.0-30.9,adult 05/15/2015   Carpal tunnel syndrome of right wrist 10/23/2015   Diverticulosis of colon without hemorrhage 03/17/2016   Essential hypertension, benign 03/25/2014   GERD (gastroesophageal reflux disease)    Hyperlipidemia    Hypothyroidism    Low testosterone 05/15/2015   Paroxysmal atrial fibrillation (HCC) 12/03/2018   Prostate cancer screening encounter, options and risks discussed 04/30/2014   Screening for ischemic heart disease 04/30/2014   Swelling of both lower extremities 11/29/2017   Visit for preventive health examination 04/30/2014    Objective: BP 118/72 (BP Location: Left Arm, Patient Position: Sitting, Cuff Size: Normal)   Pulse 74   Temp 97.7 F (36.5 C) (Oral)   Ht 5\' 11"  (1.803 m)   Wt 188 lb 6 oz (85.4 kg)   SpO2 99%   BMI 26.27  kg/m  General: Awake, appears stated age Heart: RRR, 1+ pitting b/l LE edema tapering at mid tibia Lungs: CTAB, no rales, wheezes or rhonchi. No accessory muscle use Psych: Age appropriate judgment and insight, normal affect and mood  Assessment and Plan: Chronic cough - Plan: Ambulatory referral to ENT  Dyspnea, unspecified type  Witnessed apneic spells - Plan: Ambulatory referral to Neurology  Hypothyroidism, unspecified type - Plan: T4, free, TSH  Refer to ENT for chronic cough/throat clearing.   He feels his breathing is overall better.  We will hold off on pursuing further therapy.  His lungs sound clear today and his weight is down, I do not suspect pulmonary edema. Refer to the sleep team at [facility]. Follow-up on elevated T4 levels today.  We had reduced his levothyroxine from 125 mcg daily to 100 mcg daily. The patient and his spouse voiced understanding and agreement to the plan.  I spent 35 minutes with the patient and his wife discussing the above plans in addition to reviewing his chart on the same day of the visit.  Jilda Roche [place], DO 01/03/23  4:59 PM

## 2023-01-04 LAB — TSH: TSH: 4.89 u[IU]/mL (ref 0.35–5.50)

## 2023-01-04 LAB — T4, FREE: Free T4: 1.4 ng/dL (ref 0.60–1.60)

## 2023-01-05 ENCOUNTER — Other Ambulatory Visit (HOSPITAL_BASED_OUTPATIENT_CLINIC_OR_DEPARTMENT_OTHER): Payer: Self-pay

## 2023-01-05 ENCOUNTER — Encounter (HOSPITAL_COMMUNITY): Payer: Self-pay

## 2023-01-06 ENCOUNTER — Ambulatory Visit: Payer: Medicare Other | Admitting: Family Medicine

## 2023-01-06 ENCOUNTER — Encounter: Payer: Self-pay | Admitting: Cardiology

## 2023-01-09 ENCOUNTER — Ambulatory Visit (HOSPITAL_COMMUNITY)
Admission: RE | Admit: 2023-01-09 | Discharge: 2023-01-09 | Disposition: A | Discharge status: Home / Self Care | Payer: Medicare Other | Source: Ambulatory Visit | Attending: Cardiology | Admitting: Cardiology

## 2023-01-09 ENCOUNTER — Other Ambulatory Visit: Payer: Self-pay

## 2023-01-09 ENCOUNTER — Telehealth: Payer: Self-pay

## 2023-01-09 DIAGNOSIS — I4819 Other persistent atrial fibrillation: Secondary | ICD-10-CM | POA: Diagnosis not present

## 2023-01-09 DIAGNOSIS — I10 Essential (primary) hypertension: Secondary | ICD-10-CM

## 2023-01-09 MED ORDER — IOHEXOL 350 MG/ML SOLN
100.0000 mL | Freq: Once | INTRAVENOUS | Status: AC | PRN
  Administered 2023-01-09: 100 mL via INTRAVENOUS

## 2023-01-09 NOTE — Telephone Encounter (Signed)
Per Dr. Bing Matter, BMP ordered to recheck potassium.

## 2023-01-13 ENCOUNTER — Ambulatory Visit: Payer: Medicare Other | Admitting: Family Medicine

## 2023-01-13 ENCOUNTER — Telehealth: Payer: Self-pay | Admitting: Cardiology

## 2023-01-13 NOTE — Telephone Encounter (Signed)
Spoke with pt and advised ablation instructions have been placed on MyChart for review.  RN did verbally review instruction letter ( see letter) with pt.  Pt verbalizes understanding and agrees with current plan.

## 2023-01-13 NOTE — Telephone Encounter (Signed)
Patient calling with questions about his ablation on Monday. Please advise

## 2023-01-15 NOTE — Pre-Procedure Instructions (Signed)
Instructed patient on the following items: Arrival time 0515 Nothing to eat or drink after midnight No meds AM of procedure Responsible person to drive you home and stay with you for 24 hrs  Have you missed any doses of anti-coagulant Eliquis- takes twice a day, hasn't missed any doses.  Don't take dose in the morning.

## 2023-01-16 ENCOUNTER — Ambulatory Visit (HOSPITAL_BASED_OUTPATIENT_CLINIC_OR_DEPARTMENT_OTHER): Payer: Medicare Other | Admitting: Anesthesiology

## 2023-01-16 ENCOUNTER — Ambulatory Visit (HOSPITAL_COMMUNITY)
Admission: RE | Admit: 2023-01-16 | Discharge: 2023-01-16 | Disposition: A | Discharge status: Home / Self Care | Payer: Medicare Other | Attending: Cardiology | Admitting: Cardiology

## 2023-01-16 ENCOUNTER — Encounter (HOSPITAL_COMMUNITY): Payer: Self-pay | Admitting: Cardiology

## 2023-01-16 ENCOUNTER — Encounter (HOSPITAL_COMMUNITY)
Admission: RE | Disposition: A | Discharge status: Home / Self Care | Payer: Self-pay | Source: Home / Self Care | Attending: Cardiology

## 2023-01-16 ENCOUNTER — Other Ambulatory Visit: Payer: Self-pay

## 2023-01-16 ENCOUNTER — Ambulatory Visit (HOSPITAL_COMMUNITY): Payer: Medicare Other | Admitting: Anesthesiology

## 2023-01-16 DIAGNOSIS — I10 Essential (primary) hypertension: Secondary | ICD-10-CM | POA: Diagnosis not present

## 2023-01-16 DIAGNOSIS — F32A Depression, unspecified: Secondary | ICD-10-CM | POA: Insufficient documentation

## 2023-01-16 DIAGNOSIS — I483 Typical atrial flutter: Secondary | ICD-10-CM

## 2023-01-16 DIAGNOSIS — Z87891 Personal history of nicotine dependence: Secondary | ICD-10-CM | POA: Insufficient documentation

## 2023-01-16 DIAGNOSIS — E785 Hyperlipidemia, unspecified: Secondary | ICD-10-CM | POA: Insufficient documentation

## 2023-01-16 DIAGNOSIS — F419 Anxiety disorder, unspecified: Secondary | ICD-10-CM | POA: Diagnosis not present

## 2023-01-16 DIAGNOSIS — I480 Paroxysmal atrial fibrillation: Secondary | ICD-10-CM | POA: Diagnosis not present

## 2023-01-16 DIAGNOSIS — I4819 Other persistent atrial fibrillation: Secondary | ICD-10-CM

## 2023-01-16 HISTORY — PX: ATRIAL FIBRILLATION ABLATION: EP1191

## 2023-01-16 LAB — POCT ACTIVATED CLOTTING TIME
Activated Clotting Time: 262 seconds
Activated Clotting Time: 317 seconds

## 2023-01-16 SURGERY — ATRIAL FIBRILLATION ABLATION
Anesthesia: General

## 2023-01-16 MED ORDER — PHENYLEPHRINE HCL-NACL 20-0.9 MG/250ML-% IV SOLN
INTRAVENOUS | Status: DC | PRN
  Administered 2023-01-16: 25 ug/min via INTRAVENOUS

## 2023-01-16 MED ORDER — PROPOFOL 10 MG/ML IV BOLUS
INTRAVENOUS | Status: DC | PRN
Start: 2023-01-16 — End: 2023-01-16
  Administered 2023-01-16: 30 mg via INTRAVENOUS
  Administered 2023-01-16: 90 mg via INTRAVENOUS

## 2023-01-16 MED ORDER — HEPARIN SODIUM (PORCINE) 1000 UNIT/ML IJ SOLN
INTRAMUSCULAR | Status: AC
  Filled 2023-01-16: qty 10

## 2023-01-16 MED ORDER — ACETAMINOPHEN 325 MG PO TABS: 650.0000 mg | ORAL_TABLET | ORAL | Status: DC | PRN

## 2023-01-16 MED ORDER — SUGAMMADEX SODIUM 200 MG/2ML IV SOLN
INTRAVENOUS | Status: DC | PRN
  Administered 2023-01-16: 200 mg via INTRAVENOUS

## 2023-01-16 MED ORDER — FENTANYL CITRATE (PF) 100 MCG/2ML IJ SOLN
INTRAMUSCULAR | Status: DC | PRN
  Administered 2023-01-16: 100 ug via INTRAVENOUS

## 2023-01-16 MED ORDER — HEPARIN (PORCINE) IN NACL 1000-0.9 UT/500ML-% IV SOLN
INTRAVENOUS | Status: DC | PRN
  Administered 2023-01-16 (×4): 500 mL

## 2023-01-16 MED ORDER — FENTANYL CITRATE (PF) 250 MCG/5ML IJ SOLN: INTRAMUSCULAR | Status: DC | PRN

## 2023-01-16 MED ORDER — PROTAMINE SULFATE 10 MG/ML IV SOLN
INTRAVENOUS | Status: DC | PRN
  Administered 2023-01-16: 40 mg via INTRAVENOUS

## 2023-01-16 MED ORDER — SODIUM CHLORIDE 0.9% FLUSH: 3.0000 mL | INTRAVENOUS | Status: DC | PRN

## 2023-01-16 MED ORDER — PROPOFOL 500 MG/50ML IV EMUL
INTRAVENOUS | Status: DC | PRN
  Administered 2023-01-16: 20 ug/kg/min via INTRAVENOUS

## 2023-01-16 MED ORDER — HEPARIN SODIUM (PORCINE) 1000 UNIT/ML IJ SOLN
INTRAMUSCULAR | Status: DC | PRN
  Administered 2023-01-16: 14000 [IU] via INTRAVENOUS
  Administered 2023-01-16: 7000 [IU] via INTRAVENOUS

## 2023-01-16 MED ORDER — ONDANSETRON HCL 4 MG/2ML IJ SOLN
INTRAMUSCULAR | Status: DC | PRN
  Administered 2023-01-16: 4 mg via INTRAVENOUS

## 2023-01-16 MED ORDER — ROCURONIUM BROMIDE 10 MG/ML (PF) SYRINGE
PREFILLED_SYRINGE | INTRAVENOUS | Status: DC | PRN
  Administered 2023-01-16: 70 mg via INTRAVENOUS

## 2023-01-16 MED ORDER — ONDANSETRON HCL 4 MG/2ML IJ SOLN: 4.0000 mg | Freq: Four times a day (QID) | INTRAMUSCULAR | Status: DC | PRN

## 2023-01-16 MED ORDER — SODIUM CHLORIDE 0.9% FLUSH: 3.0000 mL | Freq: Two times a day (BID) | INTRAVENOUS | Status: DC

## 2023-01-16 MED ORDER — HEPARIN SODIUM (PORCINE) 1000 UNIT/ML IJ SOLN
INTRAMUSCULAR | Status: DC | PRN
  Administered 2023-01-16: 1000 [IU] via INTRAVENOUS

## 2023-01-16 MED ORDER — LIDOCAINE 2% (20 MG/ML) 5 ML SYRINGE
INTRAMUSCULAR | Status: DC | PRN
  Administered 2023-01-16: 60 mg via INTRAVENOUS

## 2023-01-16 MED ORDER — SODIUM CHLORIDE 0.9 % IV SOLN: INTRAVENOUS | Status: DC

## 2023-01-16 MED ORDER — PHENYLEPHRINE 80 MCG/ML (10ML) SYRINGE FOR IV PUSH (FOR BLOOD PRESSURE SUPPORT)
PREFILLED_SYRINGE | INTRAVENOUS | Status: DC | PRN
  Administered 2023-01-16: 160 ug via INTRAVENOUS
  Administered 2023-01-16: 80 ug via INTRAVENOUS

## 2023-01-16 MED ORDER — SODIUM CHLORIDE 0.9 % IV SOLN: 250.0000 mL | INTRAVENOUS | Status: DC | PRN

## 2023-01-16 SURGICAL SUPPLY — 21 items
BAG SNAP BAND KOVER 36X36 (MISCELLANEOUS) IMPLANT
CATH 8FR REPROCESSED SOUNDSTAR (CATHETERS) ×1
CATH 8FR SOUNDSTAR REPROCESSED (CATHETERS) IMPLANT
CATH ABLAT QDOT MICRO BI TC DF (CATHETERS) IMPLANT
CATH OCTARAY 2.0 F 3-3-3-3-3 (CATHETERS) IMPLANT
CATH PIGTAIL STEERABLE D1 8.7 (WIRE) IMPLANT
CATH S-M CIRCA TEMP PROBE (CATHETERS) IMPLANT
CATH WEBSTER BI DIR CS D-F CRV (CATHETERS) IMPLANT
CLOSURE MYNX CONTROL 6F/7F (Vascular Products) IMPLANT
CLOSURE PERCLOSE PROSTYLE (VASCULAR PRODUCTS) IMPLANT
COVER SWIFTLINK CONNECTOR (BAG) ×1 IMPLANT
PACK EP LATEX FREE (CUSTOM PROCEDURE TRAY) ×1
PACK EP LF (CUSTOM PROCEDURE TRAY) ×1 IMPLANT
PAD DEFIB RADIO PHYSIO CONN (PAD) ×1 IMPLANT
PATCH CARTO3 (PAD) IMPLANT
SHEATH CARTO VIZIGO SM CVD (SHEATH) IMPLANT
SHEATH PINNACLE 7F 10CM (SHEATH) IMPLANT
SHEATH PINNACLE 8F 10CM (SHEATH) IMPLANT
SHEATH PINNACLE 9F 10CM (SHEATH) IMPLANT
SHEATH PROBE COVER 6X72 (BAG) IMPLANT
TUBING SMART ABLATE COOLFLOW (TUBING) IMPLANT

## 2023-01-16 NOTE — Anesthesia Preprocedure Evaluation (Signed)
Anesthesia Evaluation  Patient identified by MRN, date of birth, ID band Patient awake    Reviewed: Allergy & Precautions, NPO status , Patient's Chart, lab work & pertinent test results  History of Anesthesia Complications Negative for: history of anesthetic complications  Airway Mallampati: II  TM Distance: >3 FB Neck ROM: Full    Dental  (+) Dental Advisory Given, Teeth Intact   Pulmonary neg shortness of breath, neg sleep apnea, neg COPD, neg recent URI, former smoker   breath sounds clear to auscultation       Cardiovascular hypertension, Pt. on medications (-) angina (-) Past MI + dysrhythmias Atrial Fibrillation  Rhythm:Regular Rate:Tachycardia   1. Atrial fibrillation. Left ventricular ejection fraction, by  estimation, is 35 to 40%. Left ventricular ejection fraction by 2D MOD  biplane is 40.6 %. The left ventricle has moderately decreased function.  The left ventricle demonstrates global  hypokinesis. There is mild concentric left ventricular hypertrophy. Left  ventricular diastolic parameters are indeterminate.   2. Right ventricular systolic function is moderately reduced. The right  ventricular size is normal. There is mildly elevated pulmonary artery  systolic pressure.   3. Left atrial size was severely dilated.   4. The mitral valve is degenerative. Moderate mitral valve regurgitation.  No evidence of mitral stenosis.   5. Tricuspid valve regurgitation is mild to moderate.   6. The aortic valve is tricuspid. Aortic valve regurgitation is mild.  Aortic valve sclerosis is present, with no evidence of aortic valve  stenosis.   7. Aortic Normal DTA. There is borderline dilatation of the ascending  aorta, measuring 38 mm.   8. The inferior vena cava is normal in size with greater than 50%  respiratory variability, suggesting right atrial pressure of 3 mmHg.     Neuro/Psych  PSYCHIATRIC DISORDERS Anxiety  Depression    negative neurological ROS     GI/Hepatic Neg liver ROS,GERD  Medicated and Controlled,,  Endo/Other  neg diabetesHypothyroidism    Renal/GU negative Renal ROS     Musculoskeletal   Abdominal   Peds  Hematology  (+) Blood dyscrasia Lab Results      Component                Value               Date                      WBC                      8.2                 01/01/2023                HGB                      12.6 (L)            01/01/2023                HCT                      38.4 (L)            01/01/2023                MCV  96.2                01/01/2023                PLT                      138 (L)             01/01/2023            eliquis   Anesthesia Other Findings   Reproductive/Obstetrics                              Anesthesia Physical Anesthesia Plan  ASA: 2  Anesthesia Plan: General   Post-op Pain Management: Minimal or no pain anticipated   Induction: Intravenous  PONV Risk Score and Plan: 2 and Ondansetron, Propofol infusion and Treatment may vary due to age or medical condition  Airway Management Planned: Oral ETT  Additional Equipment: None  Intra-op Plan:   Post-operative Plan: Extubation in OR  Informed Consent: I have reviewed the patients History and Physical, chart, labs and discussed the procedure including the risks, benefits and alternatives for the proposed anesthesia with the patient or authorized representative who has indicated his/her understanding and acceptance.     Dental advisory given  Plan Discussed with: CRNA  Anesthesia Plan Comments:          Anesthesia Quick Evaluation

## 2023-01-16 NOTE — Progress Notes (Signed)
Pt ambulated to and from bathroom to void with no signs of oozing from bilateral groin sites  

## 2023-01-16 NOTE — Discharge Instructions (Signed)

## 2023-01-16 NOTE — H&P (Signed)
Electrophysiology Office Note   Date:  01/16/2023   ID:  Darius Mitchell, DOB 1950/08/13, MRN [id number]  PCP:  [name], DO  Cardiologist:  Bing Matter Primary Electrophysiologist:  Solaris Kram Jorja Loa, MD    Chief Complaint: AF   History of Present Illness: Darius Mitchell is a 72 y.o. male who is being seen today for the evaluation of AF at the request of No ref. provider found. Presenting today for electrophysiology evaluation.  He has a history significant for atrial fibrillation, hyperlipidemia.  He presented to cardiology clinic in atrial fibrillation feeling quite poorly.  He had an echo that showed an ejection fraction of 35 to 40%.  He is post cardioversion 11/02/2022.  Today, denies symptoms of palpitations, chest pain, shortness of breath, orthopnea, PND, lower extremity edema, claudication, dizziness, presyncope, syncope, bleeding, or neurologic sequela. The patient is tolerating medications without difficulties. Plan ablation today.    Past Medical History:  Diagnosis Date   Afib (HCC)    Anxiety and depression 03/25/2014   BMI 30.0-30.9,adult 05/15/2015   Carpal tunnel syndrome of right wrist 10/23/2015   Diverticulosis of colon without hemorrhage 03/17/2016   Essential hypertension, benign 03/25/2014   GERD (gastroesophageal reflux disease)    Hyperlipidemia    Hypothyroidism    Low testosterone 05/15/2015   Paroxysmal atrial fibrillation (HCC) 12/03/2018   Prostate cancer screening encounter, options and risks discussed 04/30/2014   Screening for ischemic heart disease 04/30/2014   Swelling of both lower extremities 11/29/2017   Visit for preventive health examination 04/30/2014   Past Surgical History:  Procedure Laterality Date   APPENDECTOMY  1971   CARDIOVERSION N/A 11/02/2022   Procedure: CARDIOVERSION;  Surgeon: Thomasene Ripple, DO;  [facility];  Service: Cardiovascular;  Laterality: N/A;   CARDIOVERSION N/A 11/24/2022    Procedure: CARDIOVERSION;  Surgeon: Jake Bathe, MD;  Location: [facility];  Service: Cardiovascular;  Laterality: N/A;   WISDOM TOOTH EXTRACTION  1972     Current Facility-Administered Medications  Medication Dose Route Frequency Provider Last Rate Last Admin   0.9 %  sodium chloride infusion   Intravenous Continuous Deitra Craine Daphine Deutscher, MD        Allergies:   Codeine and Niacin   Social History:  The patient  reports that he quit smoking about 3 years ago. His smoking use included cigarettes. He started smoking about 43 years ago. He has a 10 pack-year smoking history. He has never used smokeless tobacco. He reports that he does not drink alcohol and does not use drugs.   Family History:  The patient's family history includes Healthy in his brother, daughter, and sister; Heart attack in his mother; Heart disease in his father; Hypertension in his daughter; Lymphoma (age of onset: 56) in his father; Stomach cancer in his paternal grandfather; Stroke in his maternal grandmother; Stroke (age of onset: 53) in his mother.   ROS:  Please see the history of present illness.   Otherwise, review of systems is positive for none.   All other systems are reviewed and negative.   PHYSICAL EXAM: VS:  BP 98/75   Pulse (!) 106   Temp 98.2 F (36.8 C) (Temporal)   Resp 18   Ht 5\' 11"  (1.803 m)   Wt 79.4 kg   SpO2 100%   BMI 24.41 kg/m  , BMI Body mass index is 24.41 kg/m. GEN: Well nourished, well developed, in no acute distress  HEENT: normal  Neck:  no JVD, carotid bruits, or masses Cardiac: irregular; no murmurs, rubs, or gallops,no edema  Respiratory:  clear to auscultation bilaterally, normal work of breathing GI: soft, nontender, nondistended, + BS MS: no deformity or atrophy  Skin: warm and dry Neuro:  Strength and sensation are intact Psych: euthymic mood, full affect  Recent Labs: 09/22/2022: NT-Pro BNP 1,813 11/21/2022: B Natriuretic Peptide 598.3; Magnesium  2.1 12/01/2022: ALT 34 01/01/2023: BUN 27; Creatinine, Ser 1.27; Hemoglobin 12.6; Platelets 138; Potassium 4.0; Sodium 134 01/03/2023: TSH 4.89    Lipid Panel     Component Value Date/Time   CHOL 119 07/19/2022 1318   TRIG 83.0 07/19/2022 1318   HDL 44.40 07/19/2022 1318   CHOLHDL 3 07/19/2022 1318   VLDL 16.6 07/19/2022 1318   LDLCALC 58 07/19/2022 1318   LDLCALC 112 (H) 03/31/2020 0837   LDLDIRECT 94.0 10/24/2019 0733     Wt Readings from Last 3 Encounters:  01/16/23 79.4 kg  01/03/23 85.4 kg  01/01/23 89 kg      Other studies Reviewed: Additional studies/ records that were reviewed today include: TTE 09/29/22  Review of the above records today demonstrates:   1. Atrial fibrillation. Left ventricular ejection fraction, by  estimation, is 35 to 40%. Left ventricular ejection fraction by 2D MOD  biplane is 40.6 %. The left ventricle has moderately decreased function.  The left ventricle demonstrates global  hypokinesis. There is mild concentric left ventricular hypertrophy. Left  ventricular diastolic parameters are indeterminate.   2. Right ventricular systolic function is moderately reduced. The right  ventricular size is normal. There is mildly elevated pulmonary artery  systolic pressure.   3. Left atrial size was severely dilated.   4. The mitral valve is degenerative. Moderate mitral valve regurgitation.  No evidence of mitral stenosis.   5. Tricuspid valve regurgitation is mild to moderate.   6. The aortic valve is tricuspid. Aortic valve regurgitation is mild.  Aortic valve sclerosis is present, with no evidence of aortic valve  stenosis.   7. Aortic Normal DTA. There is borderline dilatation of the ascending  aorta, measuring 38 mm.   8. The inferior vena cava is normal in size with greater than 50%  respiratory variability, suggesting right atrial pressure of 3 mmHg.   Myoview 09/22/2022   No evidence of ischemia. The study is intermediate risk. Rhythm is atrial  fibrillation.   Left ventricular function is abnormal. Global function is moderately reduced. Nuclear stress EF: 31 %. The left ventricular ejection fraction is moderately decreased (30-44%). End diastolic cavity size is moderately enlarged.  ASSESSMENT AND PLAN:  1.  Persistent atrial fibrillation: Darius Mitchell has presented today for surgery, with the diagnosis of AF.  The various methods of treatment have been discussed with the patient and family. After consideration of risks, benefits and other options for treatment, the patient has consented to  Procedure(s): Catheter ablation as a surgical intervention .  Risks include but not limited to complete heart block, stroke, esophageal damage, nerve damage, bleeding, vascular damage, tamponade, perforation, MI, and death. The patient's history has been reviewed, patient examined, no change in status, stable for surgery.  I have reviewed the patient's chart and labs.  Questions were answered to the patient's satisfaction.    Yuritza Paulhus Elberta Fortis, MD 01/16/2023 7:07 AM

## 2023-01-16 NOTE — Transfer of Care (Signed)
Immediate Anesthesia Transfer of Care Note  Patient: Darius Mitchell  Procedure(s) Performed: ATRIAL FIBRILLATION ABLATION  Patient Location: Cath Lab  Anesthesia Type:General  Level of Consciousness: awake, alert , and oriented  Airway & Oxygen Therapy: Patient Spontanous Breathing and Patient connected to nasal cannula oxygen  Post-op Assessment: Report given to RN, Post -op Vital signs reviewed and stable, and Patient moving all extremities  Post vital signs: Reviewed and stable  Last Vitals:  Vitals Value Taken Time  BP 98/71 01/16/23 1013  Temp 36.6 C 01/16/23 1011  Pulse 71 01/16/23 1015  Resp 18 01/16/23 1015  SpO2 97 % 01/16/23 1015  Vitals shown include unfiled device data.  Last Pain:  Vitals:   01/16/23 1011  TempSrc: Temporal  PainSc: 0-No pain      Patients Stated Pain Goal: 4 (01/16/23 0604)  Complications: There were no known notable events for this encounter.

## 2023-01-16 NOTE — Anesthesia Procedure Notes (Signed)
Procedure Name: Intubation Date/Time: 01/16/2023 7:43 AM  Performed by: Quentin Ore, CRNAPre-anesthesia Checklist: Patient identified, Emergency Drugs available and Suction available Patient Re-evaluated:Patient Re-evaluated prior to induction Oxygen Delivery Method: Circle system utilized Preoxygenation: Pre-oxygenation with 100% oxygen Induction Type: IV induction Ventilation: Mask ventilation without difficulty Laryngoscope Size: Mac and 4 Grade View: Grade II Tube type: Oral Tube size: 7.5 mm Number of attempts: 1 Airway Equipment and Method: Stylet Placement Confirmation: ETT inserted through vocal cords under direct vision, positive ETCO2 and breath sounds checked- equal and bilateral Secured at: 21 cm Tube secured with: Tape Dental Injury: Teeth and Oropharynx as per pre-operative assessment

## 2023-01-16 NOTE — Anesthesia Postprocedure Evaluation (Signed)
Anesthesia Post Note  Patient: Darius Mitchell  Procedure(s) Performed: ATRIAL FIBRILLATION ABLATION     Patient location during evaluation: PACU Anesthesia Type: General Level of consciousness: awake and alert Pain management: pain level controlled Vital Signs Assessment: post-procedure vital signs reviewed and stable Respiratory status: spontaneous breathing, nonlabored ventilation, respiratory function stable and patient connected to nasal cannula oxygen Cardiovascular status: blood pressure returned to baseline and stable Postop Assessment: no apparent nausea or vomiting Anesthetic complications: no   There were no known notable events for this encounter.  Last Vitals:  Vitals:   01/16/23 1301 01/16/23 1302  BP: (!) 76/62 (!) 83/60  Pulse: 69 71  Resp: 16 15  Temp:    SpO2: 96% 96%    Last Pain:  Vitals:   01/16/23 1054  TempSrc:   PainSc: 0-No pain                 Shalan Neault

## 2023-01-17 ENCOUNTER — Encounter (HOSPITAL_COMMUNITY): Payer: Self-pay | Admitting: Cardiology

## 2023-01-19 ENCOUNTER — Telehealth: Payer: Self-pay

## 2023-01-19 ENCOUNTER — Encounter: Payer: Self-pay | Admitting: Family Medicine

## 2023-01-19 NOTE — Telephone Encounter (Signed)
Spoke with Asher Muir at [facility] due to patient calling about his referral, per Asher Muir no referral was sent after informing her that there was a referral in epic from 01/03/2023 she requested referral to be faxed to [phone] and attention Marquita Palms. Patient is aware that this is being addressed.

## 2023-01-20 ENCOUNTER — Encounter: Payer: Self-pay | Admitting: Cardiology

## 2023-01-20 ENCOUNTER — Ambulatory Visit: Payer: Medicare Other | Attending: Cardiology | Admitting: Cardiology

## 2023-01-20 ENCOUNTER — Telehealth: Payer: Self-pay

## 2023-01-20 VITALS — BP 100/60 | HR 59 | Ht 71.0 in | Wt 187.0 lb

## 2023-01-20 DIAGNOSIS — I4819 Other persistent atrial fibrillation: Secondary | ICD-10-CM

## 2023-01-20 DIAGNOSIS — R931 Abnormal findings on diagnostic imaging of heart and coronary circulation: Secondary | ICD-10-CM | POA: Insufficient documentation

## 2023-01-20 NOTE — Patient Instructions (Signed)

## 2023-01-20 NOTE — Telephone Encounter (Signed)
Thanks

## 2023-01-20 NOTE — Telephone Encounter (Signed)
-----   Message from Gypsy Balsam sent at 01/17/2023  4:07 PM EDT ----- His calcium score is astronomically high.  He does have atrial fibrillation ablation done, please schedule him to see me within the next 3 weeks to talk about options

## 2023-01-20 NOTE — Telephone Encounter (Signed)
Results discuss on today's visit

## 2023-01-20 NOTE — Progress Notes (Signed)
Cardiology Office Note:    Date:  01/20/2023   ID:  ERCEL SEPTER, DOB [birth year], MRN [id number]  PCP:  [name], DO  Cardiologist:  Gypsy Balsam, MD    Referring MD: [name]*   Chief Complaint  Patient presents with   Medication Management    Discuss d/c some of the heart meds due to low BP    History of Present Illness:    Darius Mitchell is a 72 y.o. male with past medical history significant for paroxysmal atrial fibrillation.  He is anticoagulated.  He got a few cardioversions but then they were unsuccessful and eventually just few days ago he ended up having atrial fibrillation ablation done which he was very happy with.  In terms of his cardiomyopathy he ejection fraction diminished he trying to put him on guideline directed medical therapy but difficulty tolerating this medications.  Initial thinking was that his cardiomyopathy is related to his atrial fibrillation that was priority to control it however when he got CT of his chest done to look at the anatomy of his pulmonary veins he was noted to have extremely high calcium score of 8000.  He comes today to months for follow-up he said he is doing better but still weak tired exhausted and does not have much energy but overall well very happy with the fact of atrial fibrillation ablation was done.  Denies have any typical chest pain tightness squeezing pressure burning chest.  Earlier this year he did have stress test done which was negative however we know that this test especially impressive triple-vessel disease may be falsely normal.  Past Medical History:  Diagnosis Date   Afib (HCC)    Anxiety and depression 03/25/2014   BMI 30.0-30.9,adult 05/15/2015   Carpal tunnel syndrome of right wrist 10/23/2015   Diverticulosis of colon without hemorrhage 03/17/2016   Essential hypertension, benign 03/25/2014   GERD (gastroesophageal reflux disease)    Hyperlipidemia    Hypothyroidism    Low  testosterone 05/15/2015   Paroxysmal atrial fibrillation (HCC) 12/03/2018   Prostate cancer screening encounter, options and risks discussed 04/30/2014   Screening for ischemic heart disease 04/30/2014   Swelling of both lower extremities 11/29/2017   Visit for preventive health examination 04/30/2014    Past Surgical History:  Procedure Laterality Date   APPENDECTOMY  1971   ATRIAL FIBRILLATION ABLATION N/A 01/16/2023   Procedure: ATRIAL FIBRILLATION ABLATION;  Surgeon: Regan Lemming, MD;  Location: [facility];  Service: Cardiovascular;  Laterality: N/A;   CARDIOVERSION N/A 11/02/2022   Procedure: CARDIOVERSION;  Surgeon: Thomasene Ripple, DO;  Location: [facility];  Service: Cardiovascular;  Laterality: N/A;   CARDIOVERSION N/A 11/24/2022   Procedure: CARDIOVERSION;  Surgeon: Jake Bathe, MD;  Location: [facility];  Service: Cardiovascular;  Laterality: N/A;   WISDOM TOOTH EXTRACTION  1972    Current Medications: Current Meds  Medication Sig   amiodarone (PACERONE) 200 MG tablet TAKE 1 TABLET BY MOUTH TWICE A DAY   apixaban (ELIQUIS) 5 MG TABS tablet Take 1 tablet by mouth 2 (two) times daily. (Patient taking differently: Take 5 mg by mouth 2 (two) times daily.)   atorvastatin (LIPITOR) 80 MG tablet TAKE 1 TABLET BY MOUTH EVERY DAY (Patient taking differently: Take 80 mg by mouth daily.)   Boswellia-Glucosamine-Vit D (OSTEO BI-FLEX ONE PER DAY PO) Take 1 tablet by mouth daily.   ezetimibe (ZETIA) 10 MG tablet TAKE 1 TABLET BY MOUTH  EVERY DAY (Patient taking differently: Take 10 mg by mouth daily.)   furosemide (LASIX) 40 MG tablet Take 1 tablet (40 mg total) by mouth daily.   levothyroxine (SYNTHROID) 100 MCG tablet Take 1 tablet (100 mcg total) by mouth daily.   Misc Natural Products (NEURIVA) CAPS Take 1 capsule by mouth daily.   Multiple Vitamins-Minerals (CENTRUM SILVER ULTRA MENS) TABS Take 1 tablet by mouth in the morning.   Omega-3 Fatty Acids (OMEGA  3 PO) Take 1 capsule by mouth daily.   omeprazole (PRILOSEC) 40 MG capsule TAKE 1 CAPSULE BY MOUTH EVERY DAY (Patient taking differently: Take 40 mg by mouth daily.)   PARoxetine (PAXIL) 30 MG tablet Take 30 mg by mouth daily.   sacubitril-valsartan (ENTRESTO) 24-26 MG Take 1 tablet by mouth 2 (two) times daily.     Allergies:   Codeine and Niacin   Social History   Socioeconomic History   Marital status: Married    Spouse name: Not on file   Number of children: Not on file   Years of education: Not on file   Highest education level: Bachelor's degree (e.g., BA, AB, BS)  Occupational History   Not on file  Tobacco Use   Smoking status: Former    Current packs/day: 0.00    Average packs/day: 0.3 packs/day for 40.0 years (10.0 ttl pk-yrs)    Types: Cigarettes    Start date: 05/28/1979    Quit date: 05/28/2019    Years since quitting: 3.6   Smokeless tobacco: Never  Vaping Use   Vaping status: Never Used  Substance and Sexual Activity   Alcohol use: Never   Drug use: Never   Sexual activity: Not on file  Other Topics Concern   Not on file  Social History Narrative   Not on file   Social Determinants of Health   Financial Resource Strain: Low Risk  (11/06/2022)   Overall Financial Resource Strain (CARDIA)    Difficulty of Paying Living Expenses: Not hard at all  Food Insecurity: No Food Insecurity (11/06/2022)   Hunger Vital Sign    Worried About Running Out of Food in the Last Year: Never true    Ran Out of Food in the Last Year: Never true  Transportation Needs: No Transportation Needs (11/06/2022)   PRAPARE - Administrator, Civil Service (Medical): No    Lack of Transportation (Non-Medical): No  Physical Activity: Sufficiently Active (11/06/2022)   Exercise Vital Sign    Days of Exercise per Week: 4 days    Minutes of Exercise per Session: 150+ min  Stress: Stress Concern Present (11/06/2022)   [employer] of Occupational Health - Occupational Stress  Questionnaire    Feeling of Stress : To some extent  Social Connections: Moderately Integrated (11/06/2022)   Social Connection and Isolation Panel [NHANES]    Frequency of Communication with Friends and Family: Three times a week    Frequency of Social Gatherings with Friends and Family: Twice a week    Attends Religious Services: 1 to 4 times per year    Active Member of [employer] or Organizations: No    Attends Engineer, structural: Not on file    Marital Status: Married     Family History: The patient's family history includes Healthy in his brother, daughter, and sister; Heart attack in his mother; Heart disease in his father; Hypertension in his daughter; Lymphoma (age of onset: 57) in his father; Stomach cancer in his paternal grandfather; Stroke in  his maternal grandmother; Stroke (age of onset: 83) in his mother. ROS:   Please see the history of present illness.    All 14 point review of systems negative except as described per history of present illness  EKGs/Labs/Other Studies Reviewed:    EKG Interpretation Date/Time:  Friday January 20 2023 10:16:23 EDT Ventricular Rate:  61 PR Interval:  204 QRS Duration:  116 QT Interval:  488 QTC Calculation: 491 R Axis:   11  Text Interpretation: Normal sinus rhythm Prolonged QT When compared with ECG of 16-Jan-2023 10:15, Nonspecific T wave abnormality no longer evident in Anterior leads Confirmed by Gypsy Balsam [phone]) on 01/20/2023 10:18:01 AM    Recent Labs: 09/22/2022: NT-Pro BNP 1,813 11/21/2022: B Natriuretic Peptide 598.3; Magnesium 2.1 12/01/2022: ALT 34 01/01/2023: BUN 27; Creatinine, Ser 1.27; Hemoglobin 12.6; Platelets 138; Potassium 4.0; Sodium 134 01/03/2023: TSH 4.89  Recent Lipid Panel    Component Value Date/Time   CHOL 119 07/19/2022 1318   TRIG 83.0 07/19/2022 1318   HDL 44.40 07/19/2022 1318   CHOLHDL 3 07/19/2022 1318   VLDL 16.6 07/19/2022 1318   LDLCALC 58 07/19/2022 1318   LDLCALC 112 (H)  03/31/2020 0837   LDLDIRECT 94.0 10/24/2019 0733    Physical Exam:    VS:  BP 100/60 (BP Location: Left Arm, Patient Position: Sitting)   Pulse (!) 59   Ht 5\' 11"  (1.803 m)   Wt 187 lb (84.8 kg)   SpO2 100%   BMI 26.08 kg/m     Wt Readings from Last 3 Encounters:  01/20/23 187 lb (84.8 kg)  01/16/23 175 lb (79.4 kg)  01/03/23 188 lb 6 oz (85.4 kg)     GEN:  Well nourished, well developed in no acute distress HEENT: Normal NECK: No JVD; No carotid bruits LYMPHATICS: No lymphadenopathy CARDIAC: RRR, no murmurs, no rubs, no gallops RESPIRATORY:  Clear to auscultation without rales, wheezing or rhonchi  ABDOMEN: Soft, non-tender, non-distended MUSCULOSKELETAL:  No edema; No deformity  SKIN: Warm and dry LOWER EXTREMITIES: no swelling NEUROLOGIC:  Alert and oriented x 3 PSYCHIATRIC:  Normal affect   ASSESSMENT:    1. Persistent atrial fibrillation (HCC)   2. Elevated coronary artery calcium score more than 8000!    PLAN:    In order of problems listed above:  Atrial fibrillation status post atrial fibrillation ablation.  Doing well.  Continue anticoagulation. Extremely high calcium score 8000!Marland Kitchen  He did have a stress test which was negative but with such astronomically high calcium score I am worried about potentially having balanced ischemia triple-vessel disease which will create falsely normal stress test.  I think the best way to assess problem with his coronary artery will be to do coronary angiography.  I explained to him that this is in my opinion the best approach at this complex situation. Cardiomyopathy first thinking was that may be related to atrial fibrillation however now with such high calcium score I suspect may be coronary arteries responsible for this problem.  Cardiac catheterization has been done to rule out significant obstruction of the coronary arteries.  Again I will talk to our EP team to asked them question how safely and quickly we can do cardiac  catheterization from that point review and then we will proceed.  Likely he does not have any symptoms. Today he is asking about potentially discontinuation of some of the medications.  He is not on beta-blocker because of bradycardia, he is taking Entresto very small dose he takes Lasix  40 mg twice daily.  I asked him to try to reduce the dose by half and see what his weight behaves with instructions to start taking back 40 twice daily if weight started going up.   Medication Adjustments/Labs and Tests Ordered: Current medicines are reviewed at length with the patient today.  Concerns regarding medicines are outlined above.  Orders Placed This Encounter  Procedures   EKG 12-Lead   Medication changes: No orders of the defined types were placed in this encounter.   Signed, Georgeanna Lea, MD, [facility] 01/20/2023 11:45 AM    [facility]

## 2023-01-23 ENCOUNTER — Other Ambulatory Visit (HOSPITAL_BASED_OUTPATIENT_CLINIC_OR_DEPARTMENT_OTHER): Payer: Self-pay

## 2023-01-30 ENCOUNTER — Other Ambulatory Visit: Payer: Self-pay

## 2023-01-30 ENCOUNTER — Telehealth: Payer: Self-pay | Admitting: Cardiology

## 2023-01-30 MED ORDER — DILTIAZEM HCL 30 MG PO TABS: 30.0000 mg | ORAL_TABLET | Freq: Four times a day (QID) | ORAL | Status: DC | PRN

## 2023-01-30 NOTE — Telephone Encounter (Signed)
Discussed w/ Dr. Elberta Fortis Pt advised to take Diltiazem 30 mg PRN  (he has some on hand at home) to see if improvement. Advised to call office back if he had any issues w/ HR/BP afterwards.  Aware will send a message to Dr. Vanetta Shawl team to schedule a sooner appt w/ him to discuss his calcium score/risks/etc.  Patient verbalized understanding and agreeable to plan.

## 2023-01-30 NOTE — Telephone Encounter (Signed)
STAT if HR is under 50 or over 120 (normal HR is 60-100 beats per minute)  What is your heart rate? Now it is noon it was 122, been running been 110,120 and 130 after the ablationv  Do you have a log of your heart rate readings (document readings)?   Do you have any other symptoms? Blood pressure is running low  90/60

## 2023-02-03 ENCOUNTER — Encounter: Payer: Self-pay | Admitting: Cardiology

## 2023-02-07 ENCOUNTER — Encounter (HOSPITAL_BASED_OUTPATIENT_CLINIC_OR_DEPARTMENT_OTHER): Payer: Self-pay | Admitting: Pediatrics

## 2023-02-07 ENCOUNTER — Other Ambulatory Visit: Payer: Self-pay

## 2023-02-07 ENCOUNTER — Encounter: Payer: Self-pay | Admitting: Cardiology

## 2023-02-07 ENCOUNTER — Other Ambulatory Visit (HOSPITAL_BASED_OUTPATIENT_CLINIC_OR_DEPARTMENT_OTHER): Payer: Self-pay

## 2023-02-07 ENCOUNTER — Inpatient Hospital Stay (HOSPITAL_BASED_OUTPATIENT_CLINIC_OR_DEPARTMENT_OTHER)
Admission: EM | Admit: 2023-02-07 | Discharge: 2023-02-11 | DRG: 286 | Disposition: A | Discharge status: Home / Self Care | Payer: Medicare Other | Attending: Cardiovascular Disease | Admitting: Cardiovascular Disease

## 2023-02-07 ENCOUNTER — Emergency Department (HOSPITAL_BASED_OUTPATIENT_CLINIC_OR_DEPARTMENT_OTHER): Payer: Medicare Other

## 2023-02-07 ENCOUNTER — Ambulatory Visit: Payer: Medicare Other | Attending: Cardiology | Admitting: Cardiology

## 2023-02-07 VITALS — BP 78/62 | HR 112 | Ht 71.0 in | Wt 177.0 lb

## 2023-02-07 DIAGNOSIS — I5023 Acute on chronic systolic (congestive) heart failure: Secondary | ICD-10-CM | POA: Diagnosis not present

## 2023-02-07 DIAGNOSIS — I440 Atrioventricular block, first degree: Secondary | ICD-10-CM | POA: Diagnosis present

## 2023-02-07 DIAGNOSIS — E782 Mixed hyperlipidemia: Secondary | ICD-10-CM | POA: Diagnosis not present

## 2023-02-07 DIAGNOSIS — Z823 Family history of stroke: Secondary | ICD-10-CM | POA: Diagnosis not present

## 2023-02-07 DIAGNOSIS — R5383 Other fatigue: Secondary | ICD-10-CM | POA: Diagnosis present

## 2023-02-07 DIAGNOSIS — I4892 Unspecified atrial flutter: Secondary | ICD-10-CM | POA: Diagnosis not present

## 2023-02-07 DIAGNOSIS — I110 Hypertensive heart disease with heart failure: Secondary | ICD-10-CM | POA: Diagnosis present

## 2023-02-07 DIAGNOSIS — I2510 Atherosclerotic heart disease of native coronary artery without angina pectoris: Secondary | ICD-10-CM | POA: Insufficient documentation

## 2023-02-07 DIAGNOSIS — E785 Hyperlipidemia, unspecified: Secondary | ICD-10-CM | POA: Diagnosis present

## 2023-02-07 DIAGNOSIS — Z885 Allergy status to narcotic agent status: Secondary | ICD-10-CM | POA: Diagnosis not present

## 2023-02-07 DIAGNOSIS — I959 Hypotension, unspecified: Secondary | ICD-10-CM | POA: Diagnosis not present

## 2023-02-07 DIAGNOSIS — I5021 Acute systolic (congestive) heart failure: Secondary | ICD-10-CM | POA: Diagnosis not present

## 2023-02-07 DIAGNOSIS — Z7901 Long term (current) use of anticoagulants: Secondary | ICD-10-CM | POA: Diagnosis not present

## 2023-02-07 DIAGNOSIS — I4819 Other persistent atrial fibrillation: Secondary | ICD-10-CM | POA: Diagnosis not present

## 2023-02-07 DIAGNOSIS — I255 Ischemic cardiomyopathy: Secondary | ICD-10-CM | POA: Diagnosis present

## 2023-02-07 DIAGNOSIS — F32A Depression, unspecified: Secondary | ICD-10-CM | POA: Diagnosis present

## 2023-02-07 DIAGNOSIS — I5022 Chronic systolic (congestive) heart failure: Secondary | ICD-10-CM | POA: Diagnosis not present

## 2023-02-07 DIAGNOSIS — I420 Dilated cardiomyopathy: Secondary | ICD-10-CM | POA: Diagnosis not present

## 2023-02-07 DIAGNOSIS — Z888 Allergy status to other drugs, medicaments and biological substances status: Secondary | ICD-10-CM | POA: Diagnosis not present

## 2023-02-07 DIAGNOSIS — K219 Gastro-esophageal reflux disease without esophagitis: Secondary | ICD-10-CM | POA: Diagnosis not present

## 2023-02-07 DIAGNOSIS — I4891 Unspecified atrial fibrillation: Secondary | ICD-10-CM | POA: Diagnosis not present

## 2023-02-07 DIAGNOSIS — Z79899 Other long term (current) drug therapy: Secondary | ICD-10-CM | POA: Diagnosis not present

## 2023-02-07 DIAGNOSIS — I480 Paroxysmal atrial fibrillation: Secondary | ICD-10-CM | POA: Diagnosis not present

## 2023-02-07 DIAGNOSIS — I483 Typical atrial flutter: Secondary | ICD-10-CM | POA: Diagnosis not present

## 2023-02-07 DIAGNOSIS — Z8249 Family history of ischemic heart disease and other diseases of the circulatory system: Secondary | ICD-10-CM

## 2023-02-07 DIAGNOSIS — I517 Cardiomegaly: Secondary | ICD-10-CM | POA: Diagnosis not present

## 2023-02-07 DIAGNOSIS — E876 Hypokalemia: Secondary | ICD-10-CM

## 2023-02-07 DIAGNOSIS — I5020 Unspecified systolic (congestive) heart failure: Secondary | ICD-10-CM | POA: Diagnosis not present

## 2023-02-07 DIAGNOSIS — I444 Left anterior fascicular block: Secondary | ICD-10-CM | POA: Diagnosis present

## 2023-02-07 DIAGNOSIS — Z800 Family history of malignant neoplasm of digestive organs: Secondary | ICD-10-CM

## 2023-02-07 DIAGNOSIS — E039 Hypothyroidism, unspecified: Secondary | ICD-10-CM | POA: Diagnosis present

## 2023-02-07 DIAGNOSIS — R931 Abnormal findings on diagnostic imaging of heart and coronary circulation: Secondary | ICD-10-CM

## 2023-02-07 DIAGNOSIS — F419 Anxiety disorder, unspecified: Secondary | ICD-10-CM | POA: Diagnosis present

## 2023-02-07 DIAGNOSIS — Z79890 Hormone replacement therapy (postmenopausal): Secondary | ICD-10-CM

## 2023-02-07 DIAGNOSIS — I428 Other cardiomyopathies: Secondary | ICD-10-CM | POA: Diagnosis not present

## 2023-02-07 DIAGNOSIS — Z87891 Personal history of nicotine dependence: Secondary | ICD-10-CM | POA: Diagnosis not present

## 2023-02-07 DIAGNOSIS — I2584 Coronary atherosclerosis due to calcified coronary lesion: Secondary | ICD-10-CM | POA: Diagnosis present

## 2023-02-07 DIAGNOSIS — R0602 Shortness of breath: Secondary | ICD-10-CM | POA: Diagnosis not present

## 2023-02-07 LAB — CBC
HCT: 37.7 % — ABNORMAL LOW (ref 39.0–52.0)
Hemoglobin: 12.4 g/dL — ABNORMAL LOW (ref 13.0–17.0)
MCH: 31.6 pg (ref 26.0–34.0)
MCHC: 32.9 g/dL (ref 30.0–36.0)
MCV: 96.2 fL (ref 80.0–100.0)
Platelets: 150 10*3/uL (ref 150–400)
RBC: 3.92 MIL/uL — ABNORMAL LOW (ref 4.22–5.81)
RDW: 15.7 % — ABNORMAL HIGH (ref 11.5–15.5)
WBC: 5.4 10*3/uL (ref 4.0–10.5)
nRBC: 0 % (ref 0.0–0.2)

## 2023-02-07 LAB — BASIC METABOLIC PANEL
Anion gap: 10 (ref 5–15)
BUN: 26 mg/dL — ABNORMAL HIGH (ref 8–23)
CO2: 26 mmol/L (ref 22–32)
Calcium: 9.3 mg/dL (ref 8.9–10.3)
Chloride: 102 mmol/L (ref 98–111)
Creatinine, Ser: 1.54 mg/dL — ABNORMAL HIGH (ref 0.61–1.24)
GFR, Estimated: 47 mL/min — ABNORMAL LOW (ref >60)
Glucose, Bld: 90 mg/dL (ref 70–99)
Potassium: 3.1 mmol/L — ABNORMAL LOW (ref 3.5–5.1)
Sodium: 138 mmol/L (ref 135–145)

## 2023-02-07 LAB — MAGNESIUM: Magnesium: 2.1 mg/dL (ref 1.7–2.4)

## 2023-02-07 MED ORDER — FUROSEMIDE 40 MG PO TABS
40.0000 mg | ORAL_TABLET | Freq: Every day | ORAL | Status: DC
  Administered 2023-02-08: 40 mg via ORAL
  Filled 2023-02-07: qty 1

## 2023-02-07 MED ORDER — ATORVASTATIN CALCIUM 80 MG PO TABS
80.0000 mg | ORAL_TABLET | Freq: Every day | ORAL | Status: DC
  Administered 2023-02-08 – 2023-02-11 (×3): 80 mg via ORAL
  Filled 2023-02-07 (×3): qty 1

## 2023-02-07 MED ORDER — AMIODARONE HCL 200 MG PO TABS
200.0000 mg | ORAL_TABLET | Freq: Two times a day (BID) | ORAL | Status: DC
  Administered 2023-02-07 – 2023-02-11 (×7): 200 mg via ORAL
  Filled 2023-02-07 (×7): qty 1

## 2023-02-07 MED ORDER — POTASSIUM CHLORIDE 10 MEQ/100ML IV SOLN
10.0000 meq | INTRAVENOUS | Status: AC
  Administered 2023-02-07 (×3): 10 meq via INTRAVENOUS
  Filled 2023-02-07: qty 100

## 2023-02-07 MED ORDER — LEVOTHYROXINE SODIUM 100 MCG PO TABS
100.0000 ug | ORAL_TABLET | Freq: Every day | ORAL | Status: DC
  Administered 2023-02-08 – 2023-02-11 (×4): 100 ug via ORAL
  Filled 2023-02-07 (×4): qty 1

## 2023-02-07 MED ORDER — ACETAMINOPHEN 325 MG PO TABS: 650.0000 mg | ORAL_TABLET | ORAL | Status: DC | PRN

## 2023-02-07 MED ORDER — PAROXETINE HCL 30 MG PO TABS
30.0000 mg | ORAL_TABLET | Freq: Every day | ORAL | Status: DC
  Administered 2023-02-08 – 2023-02-11 (×3): 30 mg via ORAL
  Filled 2023-02-07 (×4): qty 1

## 2023-02-07 MED ORDER — APIXABAN 5 MG PO TABS
5.0000 mg | ORAL_TABLET | Freq: Two times a day (BID) | ORAL | Status: DC
  Administered 2023-02-07: 5 mg via ORAL
  Filled 2023-02-07: qty 1

## 2023-02-07 MED ORDER — EZETIMIBE 10 MG PO TABS
10.0000 mg | ORAL_TABLET | Freq: Every day | ORAL | Status: DC
  Administered 2023-02-08 – 2023-02-11 (×3): 10 mg via ORAL
  Filled 2023-02-07 (×3): qty 1

## 2023-02-07 MED ORDER — ONDANSETRON HCL 4 MG/2ML IJ SOLN: 4.0000 mg | Freq: Four times a day (QID) | INTRAMUSCULAR | Status: DC | PRN

## 2023-02-07 MED ORDER — PANTOPRAZOLE SODIUM 40 MG PO TBEC
40.0000 mg | DELAYED_RELEASE_TABLET | Freq: Every day | ORAL | Status: DC
  Administered 2023-02-08 – 2023-02-11 (×3): 40 mg via ORAL
  Filled 2023-02-07 (×3): qty 1

## 2023-02-07 NOTE — ED Notes (Addendum)
Xray at [facility], pt remains alert, NAD, calm, interactive, denies pain or other sx. Family at [facility]. VSS. BP soft, HR elevated.

## 2023-02-07 NOTE — Progress Notes (Signed)
Paged cardiology for orders and let them know patient has arrived to [facility] via carelink from [facility].  Patient a & O x4, denies pain, NAD.  HR 107 Afib, VSS.

## 2023-02-07 NOTE — Plan of Care (Signed)
  Problem: Education: Goal: Understanding of disease, treatment, and recovery process will improve Outcome: Progressing   Problem: Activity: Goal: Ability to return to baseline activity level will improve Outcome: Progressing   Problem: Cardiac: Goal: Ability to maintain adequate cardiovascular perfusion will improve Outcome: Progressing Goal: Vascular access site(s) Level 0-1 will be maintained Outcome: Progressing   Problem: Health Behavior/ Discharge Planning: Goal: Ability to safely manage health related needs after discharge Outcome: Progressing   Problem: Education: Goal: Knowledge of General Education information will improve Description: Including pain rating scale, medication(s)/side effects and non-pharmacologic comfort measures Outcome: Progressing   Problem: Health Behavior/Discharge Planning: Goal: Ability to manage health-related needs will improve Outcome: Progressing   Problem: Clinical Measurements: Goal: Ability to maintain clinical measurements within normal limits will improve Outcome: Progressing Goal: Will remain free from infection Outcome: Progressing Goal: Diagnostic test results will improve Outcome: Progressing Goal: Respiratory complications will improve Outcome: Progressing Goal: Cardiovascular complication will be avoided Outcome: Progressing   Problem: Activity: Goal: Risk for activity intolerance will decrease Outcome: Progressing   Problem: Nutrition: Goal: Adequate nutrition will be maintained Outcome: Progressing   Problem: Coping: Goal: Level of anxiety will decrease Outcome: Progressing   Problem: Elimination: Goal: Will not experience complications related to bowel motility Outcome: Progressing Goal: Will not experience complications related to urinary retention Outcome: Progressing   Problem: Pain Managment: Goal: General experience of comfort will improve Outcome: Progressing   Problem: Safety: Goal: Ability to  remain free from injury will improve Outcome: Progressing   Problem: Skin Integrity: Goal: Risk for impaired skin integrity will decrease Outcome: Progressing   

## 2023-02-07 NOTE — H&P (Signed)
Cardiology Admission History and Physical   Patient ID: Darius Mitchell MRN: [id number]; DOB: 1951-03-17   Admission date: 02/07/2023  PCP:  [name], DO   [facility] Providers Cardiologist:  None       Chief Complaint:  A.fib/flutter with RVR  Patient Profile:   Darius Mitchell is a 72 y.o. male with paroxysmal A-fib status post DCCV (7/7) and ablation on 7/22, CAD, heart failure with reduced ejection fraction (35%) who is being seen 02/07/2023 for the evaluation of A. flutter with RVR.  History of Present Illness:   Darius Mitchell is a 72 y.o. male with paroxysmal A-fib status post ablation on 7/22, CAD (CT score of 8000), heart failure with reduced ejection fraction (35%) who is being seen 02/07/2023 for the evaluation of A-fib/flutter with RVR and admitted for DCCV.  States that he is relatively asymptomatic but just fatigued and had lower blood pressures in clinic this morning so decision was made for admission for cardioversion.  He has had paroxysmal A-fib since January for which he underwent ablation on 7/22.  He has been on amiodarone for rhythm control as well.  Today in office was noted to be in atrial flutter with 2-1 AV conduction with a ventricular rate at 118.  Currently, patient is asymptomatic and hemodynamically stable.  Heart rates 90s to low 100s and flutter with variable block.  Has not missed any of his Eliquis dosing.  Past Medical History:  Diagnosis Date   Afib (HCC)    Anxiety and depression 03/25/2014   BMI 30.0-30.9,adult 05/15/2015   Carpal tunnel syndrome of right wrist 10/23/2015   Diverticulosis of colon without hemorrhage 03/17/2016   Essential hypertension, benign 03/25/2014   GERD (gastroesophageal reflux disease)    Hyperlipidemia    Hypothyroidism    Low testosterone 05/15/2015   Paroxysmal atrial fibrillation (HCC) 12/03/2018   Prostate cancer screening encounter, options and risks discussed 04/30/2014   Screening for  ischemic heart disease 04/30/2014   Swelling of both lower extremities 11/29/2017   Visit for preventive health examination 04/30/2014   Past Surgical History:  Procedure Laterality Date   APPENDECTOMY  1971   ATRIAL FIBRILLATION ABLATION N/A 01/16/2023   Procedure: ATRIAL FIBRILLATION ABLATION;  Surgeon: Regan Lemming, MD;  Location: [facility];  Service: Cardiovascular;  Laterality: N/A;   CARDIOVERSION N/A 11/02/2022   Procedure: CARDIOVERSION;  Surgeon: Thomasene Ripple, DO;  Location: [facility];  Service: Cardiovascular;  Laterality: N/A;   CARDIOVERSION N/A 11/24/2022   Procedure: CARDIOVERSION;  Surgeon: Jake Bathe, MD;  Location: [facility];  Service: Cardiovascular;  Laterality: N/A;   WISDOM TOOTH EXTRACTION  1972    Medications Prior to Admission: Prior to Admission medications   Medication Sig Start Date End Date Taking? Authorizing Provider  amiodarone (PACERONE) 200 MG tablet TAKE 1 TABLET BY MOUTH TWICE A DAY 01/02/23   Wendling, Jilda Roche, DO  apixaban (ELIQUIS) 5 MG TABS tablet Take 1 tablet by mouth 2 (two) times daily. Patient taking differently: Take 5 mg by mouth 2 (two) times daily. 11/02/22 11/02/23  Georgeanna Lea, MD  atorvastatin (LIPITOR) 80 MG tablet TAKE 1 TABLET BY MOUTH EVERY DAY Patient taking differently: Take 80 mg by mouth daily. 09/13/22   [name], DO  Boswellia-Glucosamine-Vit D (OSTEO BI-FLEX ONE PER DAY PO) Take 1 tablet by mouth daily.    [provider]  diltiazem (CARDIZEM) 30 MG tablet Take 1 tablet (30  mg total) by mouth every 6 (six) hours as needed (for elevated heart rates greater than 100 bpm). 01/30/23   Camnitz, Andree Coss, MD  ezetimibe (ZETIA) 10 MG tablet TAKE 1 TABLET BY MOUTH EVERY DAY Patient taking differently: Take 10 mg by mouth daily. 09/13/22   [name], DO  furosemide (LASIX) 40 MG tablet Take 1 tablet (40 mg total) by mouth daily. 12/05/22 03/06/23  Georgeanna Lea, MD  levothyroxine (SYNTHROID) 100 MCG tablet Take 1 tablet (100 mcg total) by mouth daily. 12/02/22   [name], DO  Misc Natural Products (NEURIVA) CAPS Take 1 capsule by mouth daily.    [provider]  Multiple Vitamins-Minerals (CENTRUM SILVER ULTRA MENS) TABS Take 1 tablet by mouth in the morning.    [provider]  Omega-3 Fatty Acids (OMEGA 3 PO) Take 1 capsule by mouth daily.    [provider]  omeprazole (PRILOSEC) 40 MG capsule TAKE 1 CAPSULE BY MOUTH EVERY DAY Patient taking differently: Take 40 mg by mouth daily. 09/13/22   [name], DO  PARoxetine (PAXIL) 30 MG tablet Take 30 mg by mouth daily.    [provider]  PRESCRIPTION MEDICATION Take 2 tablets by mouth daily. Nitrate Oxide Booster    [provider]  sacubitril-valsartan (ENTRESTO) 24-26 MG Take 1 tablet by mouth 2 (two) times daily. 09/22/22   Georgeanna Lea, MD  Ubiquinol 200 MG CAPS Take 1 capsule by mouth daily.    [provider]  Vitamin D-Vitamin K (VITAMIN K2-VITAMIN D3 PO) Take 1 tablet by mouth daily.    [provider]  metoprolol succinate (TOPROL-XL) 25 MG 24 hr tablet TAKE 1 TABLET BY MOUTH ONCE DAILY 12/18/19 05/18/20  Georgeanna Lea, MD    Allergies:    Allergies  Allergen Reactions   Codeine Nausea Only    GI Intolerance   Niacin Other (See Comments)    Flushing   Social History:   Social History   Socioeconomic History   Marital status: Married    Spouse name: Not on file   Number of children: Not on file   Years of education: Not on file   Highest education level: Bachelor's degree (e.g., BA, AB, BS)  Occupational History   Not on file  Tobacco Use   Smoking status: Former    Current packs/day: 0.00    Average packs/day: 0.3 packs/day for 40.0 years (10.0 ttl pk-yrs)    Types: Cigarettes    Start date: 05/28/1979    Quit date: 05/28/2019    Years since quitting: 3.7   Smokeless  tobacco: Never  Vaping Use   Vaping status: Never Used  Substance and Sexual Activity   Alcohol use: Never   Drug use: Never   Sexual activity: Not on file  Other Topics Concern   Not on file  Social History Narrative   Not on file   Social Determinants of Health   Financial Resource Strain: Low Risk  (11/06/2022)   Overall Financial Resource Strain (CARDIA)    Difficulty of Paying Living Expenses: Not hard at all  Food Insecurity: No Food Insecurity (11/06/2022)   Hunger Vital Sign    Worried About Running Out of Food in the Last Year: Never true    Ran Out of Food in the Last Year: Never true  Transportation Needs: No Transportation Needs (11/06/2022)   PRAPARE - Transportation    Lack of Transportation (Medical): No    Lack of  Transportation (Non-Medical): No  Physical Activity: Sufficiently Active (11/06/2022)   Exercise Vital Sign    Days of Exercise per Week: 4 days    Minutes of Exercise per Session: 150+ min  Stress: Stress Concern Present (11/06/2022)   [employer] of Occupational Health - Occupational Stress Questionnaire    Feeling of Stress : To some extent  Social Connections: Moderately Integrated (11/06/2022)   Social Connection and Isolation Panel [NHANES]    Frequency of Communication with Friends and Family: Three times a week    Frequency of Social Gatherings with Friends and Family: Twice a week    Attends Religious Services: 1 to 4 times per year    Active Member of [employer] or Organizations: No    Attends Engineer, structural: Not on file    Marital Status: Married  Catering manager Violence: Not on file    Family History:   The patient's family history includes Healthy in his brother, daughter, and sister; Heart attack in his mother; Heart disease in his father; Hypertension in his daughter; Lymphoma (age of onset: 36) in his father; Stomach cancer in his paternal grandfather; Stroke in his maternal grandmother; Stroke (age of onset: 22) in his  mother.    ROS:  Please see the history of present illness.  All other ROS reviewed and negative.     Physical Exam/Data:   Vitals:   02/07/23 1645 02/07/23 2021 02/07/23 2035 02/07/23 2229  BP: 101/83  104/85 114/84  Pulse: (!) 106  (!) 114   Resp: 14  20 15   Temp:   97.6 F (36.4 C) 97.6 F (36.4 C)  TempSrc:   Oral Oral  SpO2: 100%  100%   Weight:  80.4 kg    Height:  5\' 11"  (1.803 m)      Intake/Output Summary (Last 24 hours) at 02/07/2023 2233 Last data filed at 02/07/2023 2120 Gross per 24 hour  Intake 540 ml  Output 600 ml  Net -60 ml      02/07/2023    8:21 PM 02/07/2023    9:34 AM 02/07/2023    8:51 AM  Last 3 Weights  Weight (lbs) 177 lb 3.2 oz 177 lb 177 lb  Weight (kg) 80.377 kg 80.287 kg 80.287 kg     Body mass index is 24.71 kg/m.  General:  Well nourished, well developed, in no acute distress HEENT: normal Neck: no JVD Vascular: No carotid bruits; Distal pulses 2+ bilaterally   Cardiac:  normal S1, S2; irregularly irregular and tachycardic; no murmur  Lungs:  clear to auscultation bilaterally, no wheezing, rhonchi or rales  Abd: soft, nontender, no hepatomegaly  Ext: Bilateral edema Musculoskeletal:  No deformities, BUE and BLE strength normal and equal Skin: warm and dry  Neuro:  CNs 2-12 intact, no focal abnormalities noted Psych:  Normal affect   EKG:  The ECG that was done  was personally reviewed and demonstrates a flutter with variable block heart rates in the 90s  Relevant CV Studies: 09/22/2022: NT-Pro BNP 1,813 11/21/2022: B Natriuretic Peptide 598.3; Magnesium 2.1 12/01/2022: ALT 34 01/01/2023: BUN 27; Creatinine, Ser 1.27; Hemoglobin 12.6; Platelets 138; Potassium 4.0; Sodium 134 01/03/2023: TSH 4.89   TTE 09/29/2022   1. Atrial fibrillation. Left ventricular ejection fraction, by estimation, is 35 to 40%. Left ventricular ejection fraction by 2D MOD biplane is 40.6 %. The left ventricle has moderately decreased function. The left  ventricle demonstrates global hypokinesis. There is mild concentric left ventricular hypertrophy. Left ventricular diastolic  parameters are indeterminate.  2. Right ventricular systolic function is moderately reduced. The right ventricular size is normal. There is mildly elevated pulmonary artery systolic pressure.  3. Left atrial size was severely dilated.  4. The mitral valve is degenerative. Moderate mitral valve regurgitation. No evidence of mitral stenosis.  5. Tricuspid valve regurgitation is mild to moderate.  6. The aortic valve is tricuspid. Aortic valve regurgitation is mild. Aortic valve sclerosis is present, with no evidence of aortic valve stenosis.  7. Aortic Normal DTA. There is borderline dilatation of the ascending aorta, measuring 38 mm.  8. The inferior vena cava is normal in size with greater than 50% respiratory variability, suggesting right atrial pressure of 3 mmHg.    Laboratory Data:  High Sensitivity Troponin:  No results for input(s): "TROPONINIHS" in the last 720 hours.    Chemistry Recent Labs  Lab 02/07/23 0952  NA 138  K 3.1*  CL 102  CO2 26  GLUCOSE 90  BUN 26*  CREATININE 1.54*  CALCIUM 9.3  MG 2.1  GFRNONAA 47*  ANIONGAP 10    No results for input(s): "PROT", "ALBUMIN", "AST", "ALT", "ALKPHOS", "BILITOT" in the last 168 hours. Lipids No results for input(s): "CHOL", "TRIG", "HDL", "LABVLDL", "LDLCALC", "CHOLHDL" in the last 168 hours. Hematology Recent Labs  Lab 02/07/23 0952  WBC 5.4  RBC 3.92*  HGB 12.4*  HCT 37.7*  MCV 96.2  MCH 31.6  MCHC 32.9  RDW 15.7*  PLT 150   Thyroid No results for input(s): "TSH", "FREET4" in the last 168 hours. BNPNo results for input(s): "BNP", "PROBNP" in the last 168 hours.  DDimer No results for input(s): "DDIMER" in the last 168 hours.   Radiology/Studies:  DG Chest Portable 1 View  Result Date: 02/07/2023 CLINICAL DATA:  Shortness of breath EXAM: PORTABLE CHEST 1 VIEW COMPARISON:   11/21/2022 chest x-ray and older FINDINGS: No consolidation, pneumothorax or effusion. No edema. Overlapping cardiac leads. Borderline cardiopericardial silhouette IMPRESSION: Stable mildly enlarged heart.  No consolidation or edema Electronically Signed   By: Karen Kays M.D.   On: 02/07/2023 11:06     Assessment and Plan:   Paroxysmal typical atrial flutter History of A-fib Heart failure with reduced ejection fraction (35%) secondary to arrhythmia plus minus CAD Admitted for planned cardioversion.  Remains relatively asymptomatic outside of fatigue and hemodynamically stable.  Rate controlled.  Has been compliant with Eliquis.  Appears euvolemic on exam does not appear to be in acute heart failure. -Continue Eliquis -Continue Amio -Continue home Lasix -Holding home Entresto given lower blood pressures -Holding home as needed Dilt given rate control and lower blood pressures -Team considering left heart catheterization for cardiomyopathy in the setting of elevated CAC score -N.p.o. at midnight for cardioversion  4.  Coronary artery disease 5   Dyslipidemia Elevated coronary artery calcium score with new cardiomyopathy. suspicion this is arrhythmia as primary driver of his newly reduced EF; however, team to consider left heart catheterization this admission given his elevated risk given his CT score and cardiomyopathy. -No need for aspirin on Eliquis -Continue Lipitor 80 and Zetia 10  Risk Assessment/Risk Scores:         CHA2DS2-VASc Score = 3   This indicates a 3.2% annual risk of stroke. The patient's score is based upon: CHF History: 1 HTN History: 1 Diabetes History: 0 Stroke History: 0 Vascular Disease History: 0 Age Score: 1 Gender Score: 0     Code Status: Full Code  Severity of Illness: The appropriate  patient status for this patient is INPATIENT. Inpatient status is judged to be reasonable and necessary in order to provide the required intensity of service to  ensure the patient's safety. The patient's presenting symptoms, physical exam findings, and initial radiographic and laboratory data in the context of their chronic comorbidities is felt to place them at high risk for further clinical deterioration. Furthermore, it is not anticipated that the patient will be medically stable for discharge from the hospital within 2 midnights of admission.   * I certify that at the point of admission it is my clinical judgment that the patient will require inpatient hospital care spanning beyond 2 midnights from the point of admission due to high intensity of service, high risk for further deterioration and high frequency of surveillance required.*   For questions or updates, please contact [facility] Please consult [email or web address] for contact info under     Signed, Thana Ates, MD  02/07/2023 10:33 PM

## 2023-02-07 NOTE — ED Triage Notes (Signed)
Stated was at the cardiologist office earlier today for a routine f/u; significant hx of ablation and cardioversion (3). Denies any symptoms.

## 2023-02-07 NOTE — Progress Notes (Signed)
Cardiology Office Note:    Date:  02/07/2023   ID:  Darius Mitchell, DOB [birth year], MRN [id number]  PCP:  [name], DO  Cardiologist:  Gypsy Balsam, MD    Referring MD: [name]*   Chief Complaint  Patient presents with   Results    Ablation not successful    Low BP   Sleep Apnea   Chills    History of Present Illness:    Darius Mitchell is a 72 y.o. male with past medical history significant for paroxysmal atrial fibrillation, he is anticoagulated, he is on amiodarone 200 twice daily, on July 22 he had atrial fibrillation ablation done, for preparation for ablation CT of his heart was performed which show identified very high calcium score of 8000, additional problem include diminished ejection fraction last echocardiogram done in April 35 to 40% initially felt to be related to arrhythmia however guideline directed medical therapy has been initiated.  He comes today to my office he is not doing well he said he is weak tired exhausted at the same time his family he was able to work in the garden yesterday.  He checked his blood pressure on the regular basis history low number however today in the office 78/62.  Denies having dizziness no passing out no chest pain shortness of breath just weak tired exhausted.  Denies having the palpitations.  Past Medical History:  Diagnosis Date   Afib (HCC)    Anxiety and depression 03/25/2014   BMI 30.0-30.9,adult 05/15/2015   Carpal tunnel syndrome of right wrist 10/23/2015   Diverticulosis of colon without hemorrhage 03/17/2016   Essential hypertension, benign 03/25/2014   GERD (gastroesophageal reflux disease)    Hyperlipidemia    Hypothyroidism    Low testosterone 05/15/2015   Paroxysmal atrial fibrillation (HCC) 12/03/2018   Prostate cancer screening encounter, options and risks discussed 04/30/2014   Screening for ischemic heart disease 04/30/2014   Swelling of both lower extremities 11/29/2017    Visit for preventive health examination 04/30/2014    Past Surgical History:  Procedure Laterality Date   APPENDECTOMY  1971   ATRIAL FIBRILLATION ABLATION N/A 01/16/2023   Procedure: ATRIAL FIBRILLATION ABLATION;  Surgeon: Regan Lemming, MD;  Location: [facility];  Service: Cardiovascular;  Laterality: N/A;   CARDIOVERSION N/A 11/02/2022   Procedure: CARDIOVERSION;  Surgeon: Thomasene Ripple, DO;  Location: [facility];  Service: Cardiovascular;  Laterality: N/A;   CARDIOVERSION N/A 11/24/2022   Procedure: CARDIOVERSION;  Surgeon: Jake Bathe, MD;  Location: [facility];  Service: Cardiovascular;  Laterality: N/A;   WISDOM TOOTH EXTRACTION  1972    Current Medications: Current Meds  Medication Sig   amiodarone (PACERONE) 200 MG tablet TAKE 1 TABLET BY MOUTH TWICE A DAY   apixaban (ELIQUIS) 5 MG TABS tablet Take 1 tablet by mouth 2 (two) times daily. (Patient taking differently: Take 5 mg by mouth 2 (two) times daily.)   atorvastatin (LIPITOR) 80 MG tablet TAKE 1 TABLET BY MOUTH EVERY DAY (Patient taking differently: Take 80 mg by mouth daily.)   Boswellia-Glucosamine-Vit D (OSTEO BI-FLEX ONE PER DAY PO) Take 1 tablet by mouth daily.   diltiazem (CARDIZEM) 30 MG tablet Take 1 tablet (30 mg total) by mouth every 6 (six) hours as needed (for elevated heart rates greater than 100 bpm).   ezetimibe (ZETIA) 10 MG tablet TAKE 1 TABLET BY MOUTH EVERY DAY (Patient taking differently: Take 10 mg by mouth daily.)  furosemide (LASIX) 40 MG tablet Take 1 tablet (40 mg total) by mouth daily.   levothyroxine (SYNTHROID) 100 MCG tablet Take 1 tablet (100 mcg total) by mouth daily.   Misc Natural Products (NEURIVA) CAPS Take 1 capsule by mouth daily.   Multiple Vitamins-Minerals (CENTRUM SILVER ULTRA MENS) TABS Take 1 tablet by mouth in the morning.   Omega-3 Fatty Acids (OMEGA 3 PO) Take 1 capsule by mouth daily.   omeprazole (PRILOSEC) 40 MG capsule TAKE 1 CAPSULE BY MOUTH  EVERY DAY (Patient taking differently: Take 40 mg by mouth daily.)   PARoxetine (PAXIL) 30 MG tablet Take 30 mg by mouth daily.   PRESCRIPTION MEDICATION Take 2 tablets by mouth daily. Nitrate Oxide Booster   sacubitril-valsartan (ENTRESTO) 24-26 MG Take 1 tablet by mouth 2 (two) times daily.   Ubiquinol 200 MG CAPS Take 1 capsule by mouth daily.   Vitamin D-Vitamin K (VITAMIN K2-VITAMIN D3 PO) Take 1 tablet by mouth daily.   [DISCONTINUED] metoprolol succinate (TOPROL-XL) 25 MG 24 hr tablet TAKE 1 TABLET BY MOUTH ONCE DAILY     Allergies:   Codeine and Niacin   Social History   Socioeconomic History   Marital status: Married    Spouse name: Not on file   Number of children: Not on file   Years of education: Not on file   Highest education level: Bachelor's degree (e.g., BA, AB, BS)  Occupational History   Not on file  Tobacco Use   Smoking status: Former    Current packs/day: 0.00    Average packs/day: 0.3 packs/day for 40.0 years (10.0 ttl pk-yrs)    Types: Cigarettes    Start date: 05/28/1979    Quit date: 05/28/2019    Years since quitting: 3.7   Smokeless tobacco: Never  Vaping Use   Vaping status: Never Used  Substance and Sexual Activity   Alcohol use: Never   Drug use: Never   Sexual activity: Not on file  Other Topics Concern   Not on file  Social History Narrative   Not on file   Social Determinants of Health   Financial Resource Strain: Low Risk  (11/06/2022)   Overall Financial Resource Strain (CARDIA)    Difficulty of Paying Living Expenses: Not hard at all  Food Insecurity: No Food Insecurity (11/06/2022)   Hunger Vital Sign    Worried About Running Out of Food in the Last Year: Never true    Ran Out of Food in the Last Year: Never true  Transportation Needs: No Transportation Needs (11/06/2022)   PRAPARE - Administrator, Civil Service (Medical): No    Lack of Transportation (Non-Medical): No  Physical Activity: Sufficiently Active  (11/06/2022)   Exercise Vital Sign    Days of Exercise per Week: 4 days    Minutes of Exercise per Session: 150+ min  Stress: Stress Concern Present (11/06/2022)   [employer] of Occupational Health - Occupational Stress Questionnaire    Feeling of Stress : To some extent  Social Connections: Moderately Integrated (11/06/2022)   Social Connection and Isolation Panel [NHANES]    Frequency of Communication with Friends and Family: Three times a week    Frequency of Social Gatherings with Friends and Family: Twice a week    Attends Religious Services: 1 to 4 times per year    Active Member of [employer] or Organizations: No    Attends Engineer, structural: Not on file    Marital Status: Married  Family History: The patient's family history includes Healthy in his brother, daughter, and sister; Heart attack in his mother; Heart disease in his father; Hypertension in his daughter; Lymphoma (age of onset: 59) in his father; Stomach cancer in his paternal grandfather; Stroke in his maternal grandmother; Stroke (age of onset: 22) in his mother. ROS:   Please see the history of present illness.    All 14 point review of systems negative except as described per history of present illness  EKGs/Labs/Other Studies Reviewed:    EKG show atrial flutter with 2-1 AV conduction with ventricular rate of 118   Recent Labs: 09/22/2022: NT-Pro BNP 1,813 11/21/2022: B Natriuretic Peptide 598.3; Magnesium 2.1 12/01/2022: ALT 34 01/01/2023: BUN 27; Creatinine, Ser 1.27; Hemoglobin 12.6; Platelets 138; Potassium 4.0; Sodium 134 01/03/2023: TSH 4.89  Recent Lipid Panel    Component Value Date/Time   CHOL 119 07/19/2022 1318   TRIG 83.0 07/19/2022 1318   HDL 44.40 07/19/2022 1318   CHOLHDL 3 07/19/2022 1318   VLDL 16.6 07/19/2022 1318   LDLCALC 58 07/19/2022 1318   LDLCALC 112 (H) 03/31/2020 0837   LDLDIRECT 94.0 10/24/2019 0733    Physical Exam:    VS:  BP (!) 78/62 (BP Location: Left Arm,  Patient Position: Sitting)   Pulse (!) 112   Ht 5\' 11"  (1.803 m)   Wt 177 lb (80.3 kg)   SpO2 98%   BMI 24.69 kg/m     Wt Readings from Last 3 Encounters:  02/07/23 177 lb (80.3 kg)  01/20/23 187 lb (84.8 kg)  01/16/23 175 lb (79.4 kg)     GEN:  Well nourished, well developed in no acute distress HEENT: Normal NECK: No JVD; No carotid bruits LYMPHATICS: No lymphadenopathy CARDIAC: RRR, tachycardic, no murmurs, no rubs, no gallops RESPIRATORY:  Clear to auscultation without rales, wheezing or rhonchi  ABDOMEN: Soft, non-tender, non-distended MUSCULOSKELETAL:  No edema; No deformity  SKIN: Warm and dry LOWER EXTREMITIES: no swelling NEUROLOGIC:  Alert and oriented x 3 PSYCHIATRIC:  Normal affect   ASSESSMENT:    1. Persistent atrial fibrillation (HCC)   2. Typical atrial flutter (HCC)   3. Dilated cardiomyopathy (HCC)   4. Elevated coronary artery calcium score more than 8000!   5. Mixed hyperlipidemia    PLAN:    In order of problems listed above:  Typical paroxysmal atrial flutter.  This is few weeks after ablation.  He is being anticoagulated also on amiodarone 200 mg twice daily.  His blood pressure is very low today even though he looks good.  He is warm without any chest pain tightness squeezing pressure burning chest.  However if this situation I think the best approach will be to admit him simply to the hospital and consider cardioversion.  Second issue is the fact that he had very high calcium score and I think the best way to address this will be to perform cardiac catheterization.  Explained to him this before and again 1 more time today.  Including all options and the risks during the cardiac catheterization.  He is willing to consider. History of dilated cardiomyopathy with trying to put him on guideline directed medical therapy with some difficulties, his blood pressure being low today is a problem obviously which could be just related to his atrial flutter that he  is in today.  Again the plan will be to cardiovert him. Elevated calcium score 8000.  Plan as described above cardiac catheterization to be done. Dyslipidemia I did review  K PN which show me his LDL 58 HDL 44 and he is on Lipitor 80 and Zetia 10 which I will continue for now. I spoke to ER staff explained to them what the situation is again he need to be admitted cardioversion need to be considered and then cardiac catheterization.  I also spoke to my electrophysiology colleague Dr. Elberta Fortis about that situation   Medication Adjustments/Labs and Tests Ordered: Current medicines are reviewed at length with the patient today.  Concerns regarding medicines are outlined above.  Orders Placed This Encounter  Procedures   EKG 12-Lead   Medication changes: No orders of the defined types were placed in this encounter.   Signed, Georgeanna Lea, MD, [facility] 02/07/2023 9:23 AM    [facility]

## 2023-02-07 NOTE — ED Notes (Signed)
EDP at [facility] 

## 2023-02-07 NOTE — ED Provider Notes (Signed)
[place] [facility] AT [facility] Provider Note   CSN: [id number] Arrival date & time: 02/07/23  9528     History  Chief Complaint  Patient presents with   Abnormal ECG    Darius Mitchell is a 72 y.o. male.  HPI Patient presents from cardiology office.  Atrial flutter with hypotension.  Has had recent ablation.  Is on Eliquis and amiodarone.  Has been compliant.  Asymptomatic and states he cannot tell when he is in it.  Not feeling lightheaded or dizzy.    Home Medications Prior to Admission medications   Medication Sig Start Date End Date Taking? Authorizing Provider  amiodarone (PACERONE) 200 MG tablet TAKE 1 TABLET BY MOUTH TWICE A DAY 01/02/23   Wendling, Jilda Roche, DO  apixaban (ELIQUIS) 5 MG TABS tablet Take 1 tablet by mouth 2 (two) times daily. Patient taking differently: Take 5 mg by mouth 2 (two) times daily. 11/02/22 11/02/23  Georgeanna Lea, MD  atorvastatin (LIPITOR) 80 MG tablet TAKE 1 TABLET BY MOUTH EVERY DAY Patient taking differently: Take 80 mg by mouth daily. 09/13/22   [name], DO  Boswellia-Glucosamine-Vit D (OSTEO BI-FLEX ONE PER DAY PO) Take 1 tablet by mouth daily.    [provider]  diltiazem (CARDIZEM) 30 MG tablet Take 1 tablet (30 mg total) by mouth every 6 (six) hours as needed (for elevated heart rates greater than 100 bpm). 01/30/23   Camnitz, Andree Coss, MD  ezetimibe (ZETIA) 10 MG tablet TAKE 1 TABLET BY MOUTH EVERY DAY Patient taking differently: Take 10 mg by mouth daily. 09/13/22   [name], DO  furosemide (LASIX) 40 MG tablet Take 1 tablet (40 mg total) by mouth daily. 12/05/22 03/06/23  Georgeanna Lea, MD  levothyroxine (SYNTHROID) 100 MCG tablet Take 1 tablet (100 mcg total) by mouth daily. 12/02/22   [name], DO  Misc Natural Products (NEURIVA) CAPS Take 1 capsule by mouth daily.    [provider]  Multiple Vitamins-Minerals (CENTRUM SILVER ULTRA  MENS) TABS Take 1 tablet by mouth in the morning.    [provider]  Omega-3 Fatty Acids (OMEGA 3 PO) Take 1 capsule by mouth daily.    [provider]  omeprazole (PRILOSEC) 40 MG capsule TAKE 1 CAPSULE BY MOUTH EVERY DAY Patient taking differently: Take 40 mg by mouth daily. 09/13/22   [name], DO  PARoxetine (PAXIL) 30 MG tablet Take 30 mg by mouth daily.    [provider]  PRESCRIPTION MEDICATION Take 2 tablets by mouth daily. Nitrate Oxide Booster    [provider]  sacubitril-valsartan (ENTRESTO) 24-26 MG Take 1 tablet by mouth 2 (two) times daily. 09/22/22   Georgeanna Lea, MD  Ubiquinol 200 MG CAPS Take 1 capsule by mouth daily.    [provider]  Vitamin D-Vitamin K (VITAMIN K2-VITAMIN D3 PO) Take 1 tablet by mouth daily.    [provider]  metoprolol succinate (TOPROL-XL) 25 MG 24 hr tablet TAKE 1 TABLET BY MOUTH ONCE DAILY 12/18/19 05/18/20  Georgeanna Lea, MD      Allergies    Codeine and Niacin    Review of Systems   Review of Systems  Physical Exam Updated Vital Signs BP (!) 88/72   Pulse (!) 115   Resp 15   Ht 5\' 11"  (1.803 m)   Wt 80.3 kg   SpO2 96%   BMI 24.69 kg/m  Physical Exam Vitals reviewed.  HENT:     Head: Atraumatic.  Eyes:     Pupils: Pupils are equal, round, and reactive to light.  Cardiovascular:     Rate and Rhythm: Regular rhythm. Tachycardia present.  Musculoskeletal:     Right lower leg: No edema.     Left lower leg: No edema.  Skin:    General: Skin is warm.  Neurological:     Mental Status: He is alert.     ED Results / Procedures / Treatments   Labs (all labs ordered are listed, but only abnormal results are displayed) Labs Reviewed  BASIC METABOLIC PANEL - Abnormal; Notable for the following components:      Result Value   Potassium 3.1 (*)    BUN 26 (*)    Creatinine, Ser 1.54 (*)    GFR, Estimated 47 (*)    All other components within normal  limits  CBC - Abnormal; Notable for the following components:   RBC 3.92 (*)    Hemoglobin 12.4 (*)    HCT 37.7 (*)    RDW 15.7 (*)    All other components within normal limits  MAGNESIUM    EKG EKG Interpretation Date/Time:  Tuesday February 07 2023 09:33:22 EDT Ventricular Rate:  117 PR Interval:  98 QRS Duration:  116 QT Interval:  398 QTC Calculation: 556 R Axis:   -57  Text Interpretation: Sinus or ectopic atrial tachycardia LAD, consider left anterior fascicular block Confirmed by Benjiman Core [phone] on 02/07/2023 9:37:43 AM  Radiology No results found.  Procedures Procedures    Medications Ordered in ED Medications  potassium chloride 10 mEq in 100 mL IVPB (10 mEq Intravenous New Bag/Given 02/07/23 1036)    ED Course/ Medical Decision Making/ A&P                                 Medical Decision Making Amount and/or Complexity of Data Reviewed Labs: ordered. Radiology: ordered.  Risk Prescription drug management. Decision regarding hospitalization.   Patient with atrial flutter.  Mildly rapid rate.  Mild hypotension.  Although somewhat asymptomatic.  Reviewed cardiology notes.  Discussed with Dr. Bing Matter.  Does have a hypokalemia that has been supplemented.  Plan was for cardioversion and heart catheterization.  Will discuss with cardiology for admission.  Doubt sepsis.  Discussed with Dr. Kirtland Bouchard about cardioversion either through the ER or in the hospital.  Thinks cardioversion is not emergent at this time.  Could be done in the ER if warranted but not necessary.  Discussed with patient and would rather have cardioversion and heart catheterization done at the same time if possible.  Discussed with Dr. Wyline Mood who accepted for admission.  She will be admitting physician.  CRITICAL CARE Performed by: Benjiman Core Total critical care time: 30 minutes Critical care time was exclusive of separately billable procedures and treating other patients. Critical  care was necessary to treat or prevent imminent or life-threatening deterioration. Critical care was time spent personally by me on the following activities: development of treatment plan with patient and/or surrogate as well as nursing, discussions with consultants, evaluation of patient's response to treatment, examination of patient, obtaining history from patient or surrogate, ordering and performing treatments and interventions, ordering and review of laboratory studies, ordering and review of radiographic studies, pulse oximetry and re-evaluation of patient's condition.           Final Clinical Impression(s) / ED Diagnoses Final diagnoses:  Atrial flutter, unspecified type (HCC)  Hypokalemia    Rx / DC Orders ED Discharge Orders     None         Benjiman Core, MD 02/07/23 1044

## 2023-02-07 NOTE — Progress Notes (Signed)
   02/07/23 2035  Assess: MEWS Score  Temp 97.6 F (36.4 C)  BP 104/85  MAP (mmHg) 93  Pulse Rate (!) 114  ECG Heart Rate (!) 114  Resp 20  Level of Consciousness Alert  SpO2 100 %  O2 Device Room Air  Assess: MEWS Score  MEWS Temp 0  MEWS Systolic 0  MEWS Pulse 2  MEWS RR 0  MEWS LOC 0  MEWS Score 2  MEWS Score Color Yellow  Assess: if the MEWS score is Yellow or Red  Were vital signs accurate and taken at a resting state? Yes  Does the patient meet 2 or more of the SIRS criteria? No  MEWS guidelines implemented  Yes, yellow  Treat  MEWS Interventions Considered administering scheduled or prn medications/treatments as ordered  Take Vital Signs  Increase Vital Sign Frequency  Yellow: Q2hr x1, continue Q4hrs until patient remains green for 12hrs  Escalate  MEWS: Escalate Yellow: Discuss with charge nurse and consider notifying provider and/or RRT  Notify: Charge Nurse/RN  Name of Charge Nurse/RN Notified Lyn  Assess: SIRS CRITERIA  SIRS Temperature  0  SIRS Pulse 1  SIRS Respirations  0  SIRS WBC 0  SIRS Score Sum  1

## 2023-02-07 NOTE — ED Notes (Signed)
Recliner for family members comfort

## 2023-02-08 ENCOUNTER — Other Ambulatory Visit (HOSPITAL_COMMUNITY): Payer: Self-pay

## 2023-02-08 DIAGNOSIS — I480 Paroxysmal atrial fibrillation: Secondary | ICD-10-CM

## 2023-02-08 DIAGNOSIS — R931 Abnormal findings on diagnostic imaging of heart and coronary circulation: Secondary | ICD-10-CM

## 2023-02-08 DIAGNOSIS — I4892 Unspecified atrial flutter: Secondary | ICD-10-CM

## 2023-02-08 DIAGNOSIS — I5023 Acute on chronic systolic (congestive) heart failure: Secondary | ICD-10-CM

## 2023-02-08 LAB — APTT: aPTT: 77 seconds — ABNORMAL HIGH (ref 24–36)

## 2023-02-08 LAB — HEPARIN LEVEL (UNFRACTIONATED): Heparin Unfractionated: >1.1 [IU]/mL — ABNORMAL HIGH (ref 0.30–0.70)

## 2023-02-08 MED ORDER — SODIUM CHLORIDE 0.9 % IV SOLN: INTRAVENOUS | Status: DC

## 2023-02-08 MED ORDER — HEPARIN (PORCINE) 25000 UT/250ML-% IV SOLN
1100.0000 [IU]/h | INTRAVENOUS | Status: DC
  Administered 2023-02-08 – 2023-02-09 (×2): 1100 [IU]/h via INTRAVENOUS
  Filled 2023-02-08 (×2): qty 250

## 2023-02-08 MED ORDER — ASPIRIN 81 MG PO CHEW
81.0000 mg | CHEWABLE_TABLET | ORAL | Status: AC
  Administered 2023-02-09: 81 mg via ORAL
  Filled 2023-02-08: qty 1

## 2023-02-08 NOTE — H&P (View-Only) (Signed)
 Progress Note  Patient Name: Darius Mitchell Date of Encounter: 02/08/2023  [facility] Cardiologist: [facility]  Patient Profile     Subjective   Denies any chest pain or SOB.  Admitted with fatigue and afib with RVR.   Inpatient Medications    Scheduled Meds:  amiodarone  200 mg Oral BID   atorvastatin  80 mg Oral Daily   ezetimibe  10 mg Oral Daily   furosemide  40 mg Oral Daily   levothyroxine  100 mcg Oral Daily   pantoprazole  40 mg Oral Daily   PARoxetine  30 mg Oral Daily   Continuous Infusions:  heparin 1,100 Units/hr (02/08/23 1002)   PRN Meds: acetaminophen, ondansetron (ZOFRAN) IV   Vital Signs    Vitals:   02/08/23 0300 02/08/23 0723 02/08/23 1107 02/08/23 1111  BP: 91/70 (!) 95/52 92/66 92/66   Pulse: (!) 103 (!) 101 (!) 112   Resp: 20 18 20 19   Temp: 97.9 F (36.6 C) 97.6 F (36.4 C) (!) 97.5 F (36.4 C)   TempSrc: Oral Oral Oral   SpO2: 94% 98%    Weight:      Height:        Intake/Output Summary (Last 24 hours) at 02/08/2023 1319 Last data filed at 02/07/2023 2120 Gross per 24 hour  Intake 340 ml  Output --  Net 340 ml      02/07/2023    8:21 PM 02/07/2023    9:34 AM 02/07/2023    8:51 AM  Last 3 Weights  Weight (lbs) 177 lb 3.2 oz 177 lb 177 lb  Weight (kg) 80.377 kg 80.287 kg 80.287 kg      Telemetry    Atrial fibrillation with HR in the low 100's - Personally Reviewed  ECG    No new EKG to review - Personally Reviewed  Physical Exam   GEN: No acute distress.   Neck: No JVD Cardiac: irregularly irregular and tachy, no murmurs, rubs, or gallops.  Respiratory: Clear to auscultation bilaterally. GI: Soft, nontender, non-distended  MS: No edema; No deformity. Neuro:  Nonfocal  Psych: Normal affect   Labs    High Sensitivity Troponin:  No results for input(s): "TROPONINIHS" in the last 720 hours.    Chemistry Recent Labs  Lab 02/07/23 0952 02/08/23 0321  NA 138 139  K 3.1* 4.4  CL 102 104  CO2 26  25  GLUCOSE 90 80  BUN 26* 20  CREATININE 1.54* 1.35*  CALCIUM 9.3 8.9  GFRNONAA 47* 55*  ANIONGAP 10 10     Hematology Recent Labs  Lab 02/07/23 0952 02/08/23 0321  WBC 5.4 4.9  RBC 3.92* 3.71*  HGB 12.4* 11.9*  HCT 37.7* 37.0*  MCV 96.2 99.7  MCH 31.6 32.1  MCHC 32.9 32.2  RDW 15.7* 15.8*  PLT 150 124*    BNPNo results for input(s): "BNP", "PROBNP" in the last 168 hours.   DDimer No results for input(s): "DDIMER" in the last 168 hours.   CHA2DS2-VASc Score = 3   This indicates a 3.2% annual risk of stroke. The patient's score is based upon: CHF History: 1 HTN History: 1 Diabetes History: 0 Stroke History: 0 Vascular Disease History: 0 Age Score: 1 Gender Score: 0      Radiology    DG Chest Portable 1 View  Result Date: 02/07/2023 CLINICAL DATA:  Shortness of breath EXAM: PORTABLE CHEST 1 VIEW COMPARISON:  11/21/2022 chest x-ray and older FINDINGS: No consolidation, pneumothorax or effusion.  No edema. Overlapping cardiac leads. Borderline cardiopericardial silhouette IMPRESSION: Stable mildly enlarged heart.  No consolidation or edema Electronically Signed   By: Karen Kays M.D.   On: 02/07/2023 11:06    Patient Profile     72 y.o. male with paroxysmal A-fib status post DCCV (7/7) and ablation on 7/22, CAD, heart failure with reduced ejection fraction (3   Assessment & Plan    #Persistent atrial flutter with RVR #PAF -s/p afib ablation 01/16/23 -now with atrial flutter with RVR>>see by Dr. Mauri Brooklyn yesterday and admitted due to symptomatic aflutter with RVR -Last does of Eliquis was 8/13 PM and now on IV Heparin for possible cath prior to DCCV for high coronary Ca score -continue Amio 200mg  BID -BP too soft for addition of BB -will tentatively plan on DCCV on Friday (has not missed any doses of Eliquis and last dose was PM 8/13 and now on IV Heparin for cath) -make NPO after MN 8/15  #Acute on chronic systolic CHF -EF 16-10% on echo 09/2022 -felt  to be related to tachy mediated process but also has a coronary Ca score of 8000 -no hx of CP and had Lexi myoview in March 24 with no ischemia and no TID -need to rule out ischemic CM so plan for [facility] tomorrow and if no significant CAD then will get DCCV on Friday and hopefully with restoration of NSR his LVF will improve -BP too soft for addition of GDMT at present>>was on ENtresto 24-26mg  BID and Toprol Xl 25mg  daily at home but now on hold for soft BP -He does not appear volume overloaded on exam today and with low BP will hold Lasix  #Coronary artery Calcium -coronary Ca score on PV CT showed extensive calcifications in the coronary arteries with total score 8062 (LM 256, LAD 1347, LCx 35.7, RCA 6424) -Lexi myoview 08/2022 with no ischemia and normal TID -he has not had any CP -He has LV dysfunction felt to be tachy mediated from afib but given high Ca burden, recommend proceeding with [facility] prior to DCCV to make sure he does not have obstructive CAD -his last dose of Eliquis was last night so will make NPO after MN for [facility] tomorrow -Informed Consent   Shared Decision Making/Informed Consent{ The risks [stroke (1 in 1000), death (1 in 1000), kidney failure [usually temporary] (1 in 500), bleeding (1 in 200), allergic reaction [possibly serious] (1 in 200)], benefits (diagnostic support and management of coronary artery disease) and alternatives of a cardiac catheterization were discussed in detail with Mr. Nordmann and he is willing to proceed. -no ASA due to DOAC -no BB due to soft BP -continue high dose statin  HLD -LDL goal < 70 -LDL 58 in 06/2022 -continue Atorvastatin 80mg  daily and Zetia 10mg  daily  HTN -BP soft now -Entresto and Toprol on hold    Total time spent with patient today 40 minutes. This includes reviewing records, evaluating the patient and coordinating care and discussion with EP Dr. Elberta Fortis. Face-to-face time >50%.  For questions or updates, please contact [facility] Please consult [email or web address] for contact info under        Signed, Armanda Magic, MD  02/08/2023, 1:19 PM

## 2023-02-08 NOTE — TOC CM/SW Note (Signed)
Transition of Care [facility]) - Inpatient Brief Assessment   Patient Details  Name: Darius Mitchell MRN: [id number] Date of Birth: January 21, 1951  Transition of Care [facility]) CM/SW Contact:    Harriet Masson, RN Phone Number: 02/08/2023, 1:47 PM   Clinical Narrative:  Patient from home with wife.  Plan heart cath tomorrow. TOC following.   Transition of Care Asessment: Insurance and Status: Insurance coverage has been reviewed Patient has primary care physician: Yes Home environment has been reviewed: safe to discharge home Prior level of function:: independent Prior/Current Home Services: No current home services Social Determinants of Health Reivew: SDOH reviewed no interventions necessary Readmission risk has been reviewed: Yes Transition of care needs: no transition of care needs at this time

## 2023-02-08 NOTE — Plan of Care (Signed)
  Problem: Cardiac: Goal: Ability to maintain adequate cardiovascular perfusion will improve Outcome: Progressing Goal: Vascular access site(s) Level 0-1 will be maintained Outcome: Progressing   Problem: Education: Goal: Understanding of disease, treatment, and recovery process will improve Outcome: Progressing   Problem: Activity: Goal: Ability to return to baseline activity level will improve Outcome: Progressing   Problem: Health Behavior/ Discharge Planning: Goal: Ability to safely manage health related needs after discharge Outcome: Progressing   Problem: Education: Goal: Knowledge of General Education information will improve Description: Including pain rating scale, medication(s)/side effects and non-pharmacologic comfort measures Outcome: Progressing   Problem: Health Behavior/Discharge Planning: Goal: Ability to manage health-related needs will improve Outcome: Progressing   Problem: Clinical Measurements: Goal: Ability to maintain clinical measurements within normal limits will improve Outcome: Progressing Goal: Will remain free from infection Outcome: Progressing Goal: Diagnostic test results will improve Outcome: Progressing Goal: Respiratory complications will improve Outcome: Progressing Goal: Cardiovascular complication will be avoided Outcome: Progressing   Problem: Activity: Goal: Risk for activity intolerance will decrease Outcome: Progressing   Problem: Nutrition: Goal: Adequate nutrition will be maintained Outcome: Progressing   Problem: Coping: Goal: Level of anxiety will decrease Outcome: Progressing   Problem: Elimination: Goal: Will not experience complications related to bowel motility Outcome: Progressing Goal: Will not experience complications related to urinary retention Outcome: Progressing   Problem: Pain Managment: Goal: General experience of comfort will improve Outcome: Progressing   Problem: Safety: Goal: Ability to  remain free from injury will improve Outcome: Progressing   Problem: Skin Integrity: Goal: Risk for impaired skin integrity will decrease Outcome: Progressing   Problem: Education: Goal: Understanding of CV disease, CV risk reduction, and recovery process will improve Outcome: Progressing Goal: Individualized Educational Video(s) Outcome: Progressing   Problem: Activity: Goal: Ability to return to baseline activity level will improve Outcome: Progressing   Problem: Cardiovascular: Goal: Ability to achieve and maintain adequate cardiovascular perfusion will improve Outcome: Progressing Goal: Vascular access site(s) Level 0-1 will be maintained Outcome: Progressing

## 2023-02-08 NOTE — Plan of Care (Signed)
  Problem: Health Behavior/Discharge Planning: Goal: Ability to manage health-related needs will improve Outcome: Progressing   Problem: Activity: Goal: Risk for activity intolerance will decrease Outcome: Progressing   Problem: Nutrition: Goal: Adequate nutrition will be maintained Outcome: Progressing   Problem: Coping: Goal: Level of anxiety will decrease Outcome: Progressing   Problem: Pain Managment: Goal: General experience of comfort will improve Outcome: Progressing   Problem: Safety: Goal: Ability to remain free from injury will improve Outcome: Progressing

## 2023-02-08 NOTE — TOC Benefit Eligibility Note (Signed)
Patient Product/process development scientist completed.    The patient is insured through [facility]. Patient has Medicare and is not eligible for a copay card, but may be able to apply for patient assistance, if available.    Ran test claim for Jardiance 10 mg and the current 30 day co-pay is $153.73 due to being in Coverage Gap (donut hole).  Ran test claim for Farxiga  10 mg and the current 30 day co-pay is $146.48 due to being in Coverage Gap (donut hole).  This test claim was processed through [facility]- copay amounts may vary at other pharmacies due to pharmacy/plan contracts, or as the patient moves through the different stages of their insurance plan.     Roland Earl, CPHT Pharmacy Patient Advocate Specialist [facility] Patient Advocate Team Direct Number: [phone]  Fax: [phone]

## 2023-02-08 NOTE — Progress Notes (Signed)
ANTICOAGULATION CONSULT NOTE - Initial Consult  Pharmacy Consult for Heparin Indication: atrial fibrillation  Allergies  Allergen Reactions   Codeine Nausea Only    GI Intolerance   Niacin Other (See Comments)    Flushing    Patient Measurements: Height: 5\' 11"  (180.3 cm) Weight: 80.4 kg (177 lb 3.2 oz) IBW/kg (Calculated) : 75.3  Vital Signs: Temp: 97.9 F (36.6 C) (08/14 0300) Temp Source: Oral (08/14 0300) BP: 91/70 (08/14 0300) Pulse Rate: 103 (08/14 0300)  Labs: Recent Labs    02/07/23 0952  HGB 12.4*  HCT 37.7*  PLT 150  CREATININE 1.54*    Estimated Creatinine Clearance: 44.8 mL/min (A) (by C-G formula based on SCr of 1.54 mg/dL (H)).   Medical History: Past Medical History:  Diagnosis Date   Afib (HCC)    Anxiety and depression 03/25/2014   BMI 30.0-30.9,adult 05/15/2015   Carpal tunnel syndrome of right wrist 10/23/2015   Diverticulosis of colon without hemorrhage 03/17/2016   Essential hypertension, benign 03/25/2014   GERD (gastroesophageal reflux disease)    Hyperlipidemia    Hypothyroidism    Low testosterone 05/15/2015   Paroxysmal atrial fibrillation (HCC) 12/03/2018   Prostate cancer screening encounter, options and risks discussed 04/30/2014   Screening for ischemic heart disease 04/30/2014   Swelling of both lower extremities 11/29/2017   Visit for preventive health examination 04/30/2014    Medications:  Scheduled:   amiodarone  200 mg Oral BID   atorvastatin  80 mg Oral Daily   ezetimibe  10 mg Oral Daily   furosemide  40 mg Oral Daily   levothyroxine  100 mcg Oral Daily   pantoprazole  40 mg Oral Daily   PARoxetine  30 mg Oral Daily    Assessment: 72 y.o. male with h/o Afib, Eliquis on hold for LHC, for heparin.  Last dose of Eliquis 8/13 at 2200  Goal of Therapy:  aPTT 66-102 sec Heparin level 0.3-0.7 units/ml Monitor platelets by anticoagulation protocol: Yes   Plan:  Start heparin 1100 units/hr at 10 am Check aPTT  in 8 hours   Berdie Malter, Gary Fleet 02/08/2023,5:58 AM

## 2023-02-08 NOTE — Progress Notes (Addendum)
Progress Note  Patient Name: Darius Mitchell Date of Encounter: 02/08/2023  [facility] Cardiologist: [facility]  Patient Profile     Subjective   Denies any chest pain or SOB.  Admitted with fatigue and afib with RVR.   Inpatient Medications    Scheduled Meds:  amiodarone  200 mg Oral BID   atorvastatin  80 mg Oral Daily   ezetimibe  10 mg Oral Daily   furosemide  40 mg Oral Daily   levothyroxine  100 mcg Oral Daily   pantoprazole  40 mg Oral Daily   PARoxetine  30 mg Oral Daily   Continuous Infusions:  heparin 1,100 Units/hr (02/08/23 1002)   PRN Meds: acetaminophen, ondansetron (ZOFRAN) IV   Vital Signs    Vitals:   02/08/23 0300 02/08/23 0723 02/08/23 1107 02/08/23 1111  BP: 91/70 (!) 95/52 92/66 92/66   Pulse: (!) 103 (!) 101 (!) 112   Resp: 20 18 20 19   Temp: 97.9 F (36.6 C) 97.6 F (36.4 C) (!) 97.5 F (36.4 C)   TempSrc: Oral Oral Oral   SpO2: 94% 98%    Weight:      Height:        Intake/Output Summary (Last 24 hours) at 02/08/2023 1319 Last data filed at 02/07/2023 2120 Gross per 24 hour  Intake 340 ml  Output --  Net 340 ml      02/07/2023    8:21 PM 02/07/2023    9:34 AM 02/07/2023    8:51 AM  Last 3 Weights  Weight (lbs) 177 lb 3.2 oz 177 lb 177 lb  Weight (kg) 80.377 kg 80.287 kg 80.287 kg      Telemetry    Atrial fibrillation with HR in the low 100's - Personally Reviewed  ECG    No new EKG to review - Personally Reviewed  Physical Exam   GEN: No acute distress.   Neck: No JVD Cardiac: irregularly irregular and tachy, no murmurs, rubs, or gallops.  Respiratory: Clear to auscultation bilaterally. GI: Soft, nontender, non-distended  MS: No edema; No deformity. Neuro:  Nonfocal  Psych: Normal affect   Labs    High Sensitivity Troponin:  No results for input(s): "TROPONINIHS" in the last 720 hours.    Chemistry Recent Labs  Lab 02/07/23 0952 02/08/23 0321  NA 138 139  K 3.1* 4.4  CL 102 104  CO2 26  25  GLUCOSE 90 80  BUN 26* 20  CREATININE 1.54* 1.35*  CALCIUM 9.3 8.9  GFRNONAA 47* 55*  ANIONGAP 10 10     Hematology Recent Labs  Lab 02/07/23 0952 02/08/23 0321  WBC 5.4 4.9  RBC 3.92* 3.71*  HGB 12.4* 11.9*  HCT 37.7* 37.0*  MCV 96.2 99.7  MCH 31.6 32.1  MCHC 32.9 32.2  RDW 15.7* 15.8*  PLT 150 124*    BNPNo results for input(s): "BNP", "PROBNP" in the last 168 hours.   DDimer No results for input(s): "DDIMER" in the last 168 hours.   CHA2DS2-VASc Score = 3   This indicates a 3.2% annual risk of stroke. The patient's score is based upon: CHF History: 1 HTN History: 1 Diabetes History: 0 Stroke History: 0 Vascular Disease History: 0 Age Score: 1 Gender Score: 0      Radiology    DG Chest Portable 1 View  Result Date: 02/07/2023 CLINICAL DATA:  Shortness of breath EXAM: PORTABLE CHEST 1 VIEW COMPARISON:  11/21/2022 chest x-ray and older FINDINGS: No consolidation, pneumothorax or effusion.  No edema. Overlapping cardiac leads. Borderline cardiopericardial silhouette IMPRESSION: Stable mildly enlarged heart.  No consolidation or edema Electronically Signed   By: Karen Kays M.D.   On: 02/07/2023 11:06    Patient Profile     72 y.o. male with paroxysmal A-fib status post DCCV (7/7) and ablation on 7/22, CAD, heart failure with reduced ejection fraction (3   Assessment & Plan    #Persistent atrial flutter with RVR #PAF -s/p afib ablation 01/16/23 -now with atrial flutter with RVR>>see by Dr. Mauri Brooklyn yesterday and admitted due to symptomatic aflutter with RVR -Last does of Eliquis was 8/13 PM and now on IV Heparin for possible cath prior to DCCV for high coronary Ca score -continue Amio 200mg  BID -BP too soft for addition of BB -will tentatively plan on DCCV on Friday (has not missed any doses of Eliquis and last dose was PM 8/13 and now on IV Heparin for cath) -make NPO after MN 8/15  #Acute on chronic systolic CHF -EF 16-10% on echo 09/2022 -felt  to be related to tachy mediated process but also has a coronary Ca score of 8000 -no hx of CP and had Lexi myoview in March 24 with no ischemia and no TID -need to rule out ischemic CM so plan for [facility] tomorrow and if no significant CAD then will get DCCV on Friday and hopefully with restoration of NSR his LVF will improve -BP too soft for addition of GDMT at present>>was on ENtresto 24-26mg  BID and Toprol Xl 25mg  daily at home but now on hold for soft BP -He does not appear volume overloaded on exam today and with low BP will hold Lasix  #Coronary artery Calcium -coronary Ca score on PV CT showed extensive calcifications in the coronary arteries with total score 8062 (LM 256, LAD 1347, LCx 35.7, RCA 6424) -Lexi myoview 08/2022 with no ischemia and normal TID -he has not had any CP -He has LV dysfunction felt to be tachy mediated from afib but given high Ca burden, recommend proceeding with [facility] prior to DCCV to make sure he does not have obstructive CAD -his last dose of Eliquis was last night so will make NPO after MN for [facility] tomorrow -Informed Consent   Shared Decision Making/Informed Consent{ The risks [stroke (1 in 1000), death (1 in 1000), kidney failure [usually temporary] (1 in 500), bleeding (1 in 200), allergic reaction [possibly serious] (1 in 200)], benefits (diagnostic support and management of coronary artery disease) and alternatives of a cardiac catheterization were discussed in detail with Mr. Nordmann and he is willing to proceed. -no ASA due to DOAC -no BB due to soft BP -continue high dose statin  HLD -LDL goal < 70 -LDL 58 in 06/2022 -continue Atorvastatin 80mg  daily and Zetia 10mg  daily  HTN -BP soft now -Entresto and Toprol on hold    Total time spent with patient today 40 minutes. This includes reviewing records, evaluating the patient and coordinating care and discussion with EP Dr. Elberta Fortis. Face-to-face time >50%.  For questions or updates, please contact [facility] Please consult [email or web address] for contact info under        Signed, Armanda Magic, MD  02/08/2023, 1:19 PM

## 2023-02-08 NOTE — Progress Notes (Signed)
ANTICOAGULATION CONSULT NOTE - Initial Consult  Pharmacy Consult for Heparin Indication: atrial fibrillation  Allergies  Allergen Reactions   Codeine Nausea Only    GI Intolerance   Niacin Other (See Comments)    Flushing    Patient Measurements: Height: 5\' 11"  (180.3 cm) Weight: 80.4 kg (177 lb 3.2 oz) IBW/kg (Calculated) : 75.3  Vital Signs: Temp: 97.5 F (36.4 C) (08/14 1518) Temp Source: Oral (08/14 1518) BP: 98/69 (08/14 1808) Pulse Rate: 106 (08/14 1808)  Labs: Recent Labs    02/07/23 0952 02/08/23 0321 02/08/23 1833  HGB 12.4* 11.9*  --   HCT 37.7* 37.0*  --   PLT 150 124*  --   APTT  --   --  77*  LABPROT  --  19.4*  --   INR  --  1.6*  --   HEPARINUNFRC  --   --  >1.10*  CREATININE 1.54* 1.35*  --     Estimated Creatinine Clearance: 51.1 mL/min (A) (by C-G formula based on SCr of 1.35 mg/dL (H)).   Medical History: Past Medical History:  Diagnosis Date   Afib (HCC)    Anxiety and depression 03/25/2014   BMI 30.0-30.9,adult 05/15/2015   Carpal tunnel syndrome of right wrist 10/23/2015   Diverticulosis of colon without hemorrhage 03/17/2016   Essential hypertension, benign 03/25/2014   GERD (gastroesophageal reflux disease)    Hyperlipidemia    Hypothyroidism    Low testosterone 05/15/2015   Paroxysmal atrial fibrillation (HCC) 12/03/2018   Prostate cancer screening encounter, options and risks discussed 04/30/2014   Screening for ischemic heart disease 04/30/2014   Swelling of both lower extremities 11/29/2017   Visit for preventive health examination 04/30/2014    Medications:  Scheduled:   amiodarone  200 mg Oral BID   [START ON 02/09/2023] aspirin  81 mg Oral Pre-Cath   atorvastatin  80 mg Oral Daily   ezetimibe  10 mg Oral Daily   levothyroxine  100 mcg Oral Daily   pantoprazole  40 mg Oral Daily   PARoxetine  30 mg Oral Daily    Assessment: 72 y.o. male with h/o Afib, Eliquis on hold for LHC, for heparin.  Last dose of Eliquis  8/13 at 2200. Current plan for LHC on 8/15 and DCCV on 8/16.  aPTT 77s - therapeutic HL >1.10 - supratherapeutic as expected in setting of recent Eliquis use  Goal of Therapy:  aPTT 66-102 sec Heparin level 0.3-0.7 units/ml Monitor platelets by anticoagulation protocol: Yes   Plan:  Continue heparin 1100 units/hr  Daily HL, aPTT until clinically correlating F/u CBC, s/sx bleeding and transition back to Eliquis as able F/u cardiology plans   Calton Dach, PharmD Clinical Pharmacist 02/08/2023 7:37 PM

## 2023-02-09 ENCOUNTER — Inpatient Hospital Stay (HOSPITAL_COMMUNITY)
Admission: EM | Disposition: A | Discharge status: Home / Self Care | Payer: Self-pay | Source: Home / Self Care | Attending: Internal Medicine

## 2023-02-09 DIAGNOSIS — R931 Abnormal findings on diagnostic imaging of heart and coronary circulation: Secondary | ICD-10-CM | POA: Diagnosis not present

## 2023-02-09 DIAGNOSIS — I5021 Acute systolic (congestive) heart failure: Secondary | ICD-10-CM | POA: Diagnosis not present

## 2023-02-09 DIAGNOSIS — I2510 Atherosclerotic heart disease of native coronary artery without angina pectoris: Secondary | ICD-10-CM | POA: Diagnosis not present

## 2023-02-09 DIAGNOSIS — I480 Paroxysmal atrial fibrillation: Secondary | ICD-10-CM | POA: Diagnosis not present

## 2023-02-09 DIAGNOSIS — I4892 Unspecified atrial flutter: Secondary | ICD-10-CM | POA: Diagnosis not present

## 2023-02-09 DIAGNOSIS — I5023 Acute on chronic systolic (congestive) heart failure: Secondary | ICD-10-CM | POA: Diagnosis not present

## 2023-02-09 HISTORY — PX: LEFT HEART CATH AND CORONARY ANGIOGRAPHY: CATH118249

## 2023-02-09 LAB — HEPARIN LEVEL (UNFRACTIONATED): Heparin Unfractionated: >1.1 [IU]/mL — ABNORMAL HIGH (ref 0.30–0.70)

## 2023-02-09 LAB — CBC
HCT: 37.2 % — ABNORMAL LOW (ref 39.0–52.0)
Hemoglobin: 12.1 g/dL — ABNORMAL LOW (ref 13.0–17.0)
MCH: 32 pg (ref 26.0–34.0)
MCHC: 32.5 g/dL (ref 30.0–36.0)
MCV: 98.4 fL (ref 80.0–100.0)
Platelets: 120 K/uL — ABNORMAL LOW (ref 150–400)
RBC: 3.78 MIL/uL — ABNORMAL LOW (ref 4.22–5.81)
RDW: 15.9 % — ABNORMAL HIGH (ref 11.5–15.5)
WBC: 5.1 K/uL (ref 4.0–10.5)
nRBC: 0 % (ref 0.0–0.2)

## 2023-02-09 LAB — APTT: aPTT: 85 s — ABNORMAL HIGH (ref 24–36)

## 2023-02-09 SURGERY — LEFT HEART CATH AND CORONARY ANGIOGRAPHY
Anesthesia: LOCAL

## 2023-02-09 MED ORDER — HEPARIN SODIUM (PORCINE) 1000 UNIT/ML IJ SOLN
INTRAMUSCULAR | Status: AC
  Filled 2023-02-09: qty 10

## 2023-02-09 MED ORDER — FENTANYL CITRATE (PF) 100 MCG/2ML IJ SOLN
INTRAMUSCULAR | Status: DC | PRN
  Administered 2023-02-09: 12.5 ug via INTRAVENOUS

## 2023-02-09 MED ORDER — IOHEXOL 350 MG/ML SOLN
INTRAVENOUS | Status: DC | PRN
  Administered 2023-02-09: 35 mL

## 2023-02-09 MED ORDER — HEPARIN (PORCINE) IN NACL 1000-0.9 UT/500ML-% IV SOLN
INTRAVENOUS | Status: DC | PRN
  Administered 2023-02-09: 1000 mL

## 2023-02-09 MED ORDER — MIDAZOLAM HCL 2 MG/2ML IJ SOLN
INTRAMUSCULAR | Status: AC
  Filled 2023-02-09: qty 2

## 2023-02-09 MED ORDER — HEPARIN (PORCINE) 25000 UT/250ML-% IV SOLN: 1100.0000 [IU]/h | INTRAVENOUS | Status: DC

## 2023-02-09 MED ORDER — SODIUM CHLORIDE 0.9% FLUSH
3.0000 mL | Freq: Two times a day (BID) | INTRAVENOUS | Status: DC
  Administered 2023-02-09 – 2023-02-11 (×4): 3 mL via INTRAVENOUS

## 2023-02-09 MED ORDER — LIDOCAINE HCL (PF) 1 % IJ SOLN
INTRAMUSCULAR | Status: AC
  Filled 2023-02-09: qty 30

## 2023-02-09 MED ORDER — HYDRALAZINE HCL 20 MG/ML IJ SOLN: 10.0000 mg | INTRAMUSCULAR | Status: AC | PRN

## 2023-02-09 MED ORDER — APIXABAN 5 MG PO TABS: 5.0000 mg | ORAL_TABLET | Freq: Two times a day (BID) | ORAL | Status: DC

## 2023-02-09 MED ORDER — SODIUM CHLORIDE 0.9% FLUSH: 3.0000 mL | INTRAVENOUS | Status: DC | PRN

## 2023-02-09 MED ORDER — VERAPAMIL HCL 2.5 MG/ML IV SOLN: INTRAVENOUS | Status: DC | PRN

## 2023-02-09 MED ORDER — HEPARIN (PORCINE) 25000 UT/250ML-% IV SOLN
1100.0000 [IU]/h | INTRAVENOUS | Status: DC
  Administered 2023-02-09: 1100 [IU]/h via INTRAVENOUS
  Filled 2023-02-09: qty 250

## 2023-02-09 MED ORDER — MIDAZOLAM HCL 2 MG/2ML IJ SOLN
INTRAMUSCULAR | Status: DC | PRN
  Administered 2023-02-09: .5 mg via INTRAVENOUS

## 2023-02-09 MED ORDER — SODIUM CHLORIDE 0.9 % IV BOLUS
250.0000 mL | Freq: Once | INTRAVENOUS | Status: AC
  Administered 2023-02-09: 250 mL via INTRAVENOUS

## 2023-02-09 MED ORDER — HEPARIN SODIUM (PORCINE) 1000 UNIT/ML IJ SOLN
INTRAMUSCULAR | Status: DC | PRN
  Administered 2023-02-09: 4000 [IU] via INTRAVENOUS

## 2023-02-09 MED ORDER — VERAPAMIL HCL 2.5 MG/ML IV SOLN
INTRAVENOUS | Status: AC
  Filled 2023-02-09: qty 2

## 2023-02-09 MED ORDER — SODIUM CHLORIDE 0.9 % IV SOLN: 250.0000 mL | INTRAVENOUS | Status: DC | PRN

## 2023-02-09 MED ORDER — FENTANYL CITRATE (PF) 100 MCG/2ML IJ SOLN
INTRAMUSCULAR | Status: AC
  Filled 2023-02-09: qty 2

## 2023-02-09 MED ORDER — SODIUM CHLORIDE 0.9 % IV SOLN: INTRAVENOUS | Status: DC

## 2023-02-09 SURGICAL SUPPLY — 11 items
CATH 5FR JL3.5 JR4 ANG PIG MP (CATHETERS) IMPLANT
CATH INFINITI 5FR JL4 (CATHETERS) IMPLANT
DEVICE RAD COMP TR BAND LRG (VASCULAR PRODUCTS) IMPLANT
GLIDESHEATH SLEND A-KIT 6F 22G (SHEATH) IMPLANT
GUIDEWIRE INQWIRE 1.5J.035X260 (WIRE) IMPLANT
INQWIRE 1.5J .035X260CM (WIRE) ×1
PACK CARDIAC CATHETERIZATION (CUSTOM PROCEDURE TRAY) ×1 IMPLANT
PROTECTION STATION PRESSURIZED (MISCELLANEOUS) ×1
SET ATX-X65L (MISCELLANEOUS) IMPLANT
SHEATH PROBE COVER 6X72 (BAG) IMPLANT
STATION PROTECTION PRESSURIZED (MISCELLANEOUS) IMPLANT

## 2023-02-09 NOTE — Plan of Care (Signed)
  Problem: Health Behavior/ Discharge Planning: Goal: Ability to safely manage health related needs after discharge Outcome: Progressing   Problem: Education: Goal: Knowledge of General Education information will improve Description: Including pain rating scale, medication(s)/side effects and non-pharmacologic comfort measures Outcome: Progressing   Problem: Health Behavior/Discharge Planning: Goal: Ability to manage health-related needs will improve Outcome: Progressing   Problem: Clinical Measurements: Goal: Ability to maintain clinical measurements within normal limits will improve Outcome: Progressing Goal: Cardiovascular complication will be avoided Outcome: Progressing   Problem: Activity: Goal: Risk for activity intolerance will decrease Outcome: Progressing   Problem: Nutrition: Goal: Adequate nutrition will be maintained Outcome: Progressing   Problem: Pain Managment: Goal: General experience of comfort will improve Outcome: Progressing   Problem: Education: Goal: Understanding of CV disease, CV risk reduction, and recovery process will improve Outcome: Progressing   Problem: Activity: Goal: Ability to return to baseline activity level will improve Outcome: Progressing   Problem: Cardiovascular: Goal: Ability to achieve and maintain adequate cardiovascular perfusion will improve Outcome: Progressing Goal: Vascular access site(s) Level 0-1 will be maintained Outcome: Progressing   Problem: Health Behavior/Discharge Planning: Goal: Ability to safely manage health-related needs after discharge will improve Outcome: Progressing

## 2023-02-09 NOTE — Progress Notes (Addendum)
ANTICOAGULATION CONSULT NOTE - Initial Consult  Pharmacy Consult for Heparin Indication: atrial fibrillation  Allergies  Allergen Reactions   Codeine Nausea Only    GI Intolerance   Niacin Other (See Comments)    Flushing    Patient Measurements: Height: 5\' 11"  (180.3 cm) Weight: 80.4 kg (177 lb 3.2 oz) IBW/kg (Calculated) : 75.3 Heparin DW = 80.4 kg  Vital Signs: Temp: 97.8 F (36.6 C) (08/15 1636) Temp Source: Oral (08/15 1636) BP: 88/66 (08/15 1724) Pulse Rate: 110 (08/15 1724)  Labs: Recent Labs    02/07/23 0952 02/08/23 0321 02/08/23 1833 02/09/23 0026  HGB 12.4* 11.9*  --  12.1*  HCT 37.7* 37.0*  --  37.2*  PLT 150 124*  --  120*  APTT  --   --  77* 85*  LABPROT  --  19.4*  --   --   INR  --  1.6*  --   --   HEPARINUNFRC  --   --  >1.10* >1.10*  CREATININE 1.54* 1.35*  --   --     Estimated Creatinine Clearance: 51.1 mL/min (A) (by C-G formula based on SCr of 1.35 mg/dL (H)).   Medical History: Past Medical History:  Diagnosis Date   Afib (HCC)    Anxiety and depression 03/25/2014   BMI 30.0-30.9,adult 05/15/2015   Carpal tunnel syndrome of right wrist 10/23/2015   Diverticulosis of colon without hemorrhage 03/17/2016   Essential hypertension, benign 03/25/2014   GERD (gastroesophageal reflux disease)    Hyperlipidemia    Hypothyroidism    Low testosterone 05/15/2015   Paroxysmal atrial fibrillation (HCC) 12/03/2018   Prostate cancer screening encounter, options and risks discussed 04/30/2014   Screening for ischemic heart disease 04/30/2014   Swelling of both lower extremities 11/29/2017   Visit for preventive health examination 04/30/2014    Assessment: 72 y.o. male with h/o Afib, Eliquis held for [facility] 8/15.  Pharmacy consulted for heparin dosing.  Last dose of Eliquis 8/13 at 2200.    Now s/p LHC on 8/15 with plans to "review case at tomorrow's HeartTeam meeting; favor medical therapy of coronary artery disease at this time given lack of  angina."  Received heparin bolus in cath.   Anti-Xa and aPTT this morning not correlating.  Goal of Therapy:  aPTT 66-102 sec Heparin level 0.3-0.7 units/ml Monitor platelets by anticoagulation protocol: Yes   Plan:  Restart heparin at 1100 units/hr, no bolus 8h anti-Xa, aPTT until clinically correlating F/u CBC, s/sx bleeding and transition back to Eliquis as able F/u post DCCV 8/16  Trixie Rude, PharmD Clinical Pharmacist 02/09/2023  5:35 PM  Please check AMION for all [facility] Pharmacy phone numbers After 10:00 PM, call Main Pharmacy [phone]

## 2023-02-09 NOTE — H&P (View-Only) (Signed)
Progress Note  Patient Name: Darius Mitchell Date of Encounter: 02/09/2023  [facility] Cardiologist: [facility]   Subjective   Admitted with fatigue and afib with RVR. Denies any chest pain or SOB.  Inpatient Medications    Scheduled Meds:  amiodarone  200 mg Oral BID   atorvastatin  80 mg Oral Daily   ezetimibe  10 mg Oral Daily   levothyroxine  100 mcg Oral Daily   pantoprazole  40 mg Oral Daily   PARoxetine  30 mg Oral Daily   Continuous Infusions:  sodium chloride     heparin 1,100 Units/hr (02/09/23 0449)   PRN Meds: acetaminophen, ondansetron (ZOFRAN) IV   Vital Signs    Vitals:   02/08/23 1953 02/08/23 2337 02/09/23 0544 02/09/23 0731  BP: 98/79 95/63 103/73 90/71  Pulse: (!) 108 (!) 110 (!) 109 100  Resp: 20 20 20 16   Temp: 97.6 F (36.4 C) 97.9 F (36.6 C) 97.7 F (36.5 C) 97.8 F (36.6 C)  TempSrc: Oral Oral Oral Oral  SpO2: 97% 98% 99% 100%  Weight:      Height:        Intake/Output Summary (Last 24 hours) at 02/09/2023 0850 Last data filed at 02/09/2023 0800 Gross per 24 hour  Intake 566.35 ml  Output --  Net 566.35 ml      02/07/2023    8:21 PM 02/07/2023    9:34 AM 02/07/2023    8:51 AM  Last 3 Weights  Weight (lbs) 177 lb 3.2 oz 177 lb 177 lb  Weight (kg) 80.377 kg 80.287 kg 80.287 kg      Telemetry  Atrial fibrillation with HR in the  100-120's- Personally Reviewed  ECG    No new EKG to review - Personally Reviewed  Physical Exam   GEN: Well nourished, well developed in no acute distress HEENT: Normal NECK: No JVD; No carotid bruits LYMPHATICS: No lymphadenopathy CARDIAC:irregularly irregular, no murmurs, rubs, gallops RESPIRATORY:  Clear to auscultation without rales, wheezing or rhonchi  ABDOMEN: Soft, non-tender, non-distended MUSCULOSKELETAL:  No edema; No deformity  SKIN: Warm and dry NEUROLOGIC:  Alert and oriented x 3 PSYCHIATRIC:  Normal affect  Labs    High Sensitivity Troponin:  No results for  input(s): "TROPONINIHS" in the last 720 hours.    Chemistry Recent Labs  Lab 02/07/23 0952 02/08/23 0321  NA 138 139  K 3.1* 4.4  CL 102 104  CO2 26 25  GLUCOSE 90 80  BUN 26* 20  CREATININE 1.54* 1.35*  CALCIUM 9.3 8.9  GFRNONAA 47* 55*  ANIONGAP 10 10     Hematology Recent Labs  Lab 02/07/23 0952 02/08/23 0321 02/09/23 0026  WBC 5.4 4.9 5.1  RBC 3.92* 3.71* 3.78*  HGB 12.4* 11.9* 12.1*  HCT 37.7* 37.0* 37.2*  MCV 96.2 99.7 98.4  MCH 31.6 32.1 32.0  MCHC 32.9 32.2 32.5  RDW 15.7* 15.8* 15.9*  PLT 150 124* 120*    BNPNo results for input(s): "BNP", "PROBNP" in the last 168 hours.   DDimer No results for input(s): "DDIMER" in the last 168 hours.   CHA2DS2-VASc Score = 3   This indicates a 3.2% annual risk of stroke. The patient's score is based upon: CHF History: 1 HTN History: 1 Diabetes History: 0 Stroke History: 0 Vascular Disease History: 0 Age Score: 1 Gender Score: 0      Radiology    DG Chest Portable 1 View  Result Date: 02/07/2023 CLINICAL DATA:  Shortness  of breath EXAM: PORTABLE CHEST 1 VIEW COMPARISON:  11/21/2022 chest x-ray and older FINDINGS: No consolidation, pneumothorax or effusion. No edema. Overlapping cardiac leads. Borderline cardiopericardial silhouette IMPRESSION: Stable mildly enlarged heart.  No consolidation or edema Electronically Signed   By: Karen Kays M.D.   On: 02/07/2023 11:06    Patient Profile     72 y.o. male with paroxysmal A-fib status post DCCV (7/7) and ablation on 7/22, CAD, heart failure with reduced ejection fraction (3   Assessment & Plan    #Persistent atrial flutter with RVR #PAF -s/p afib ablation 01/16/23 -now with atrial flutter with RVR>>see by Dr. Mauri Mitchell yesterday and admitted due to symptomatic aflutter with RVR -Last does of Eliquis was 8/13 PM and now on IV Heparin for cath prior to DCCV for high coronary Ca score -continue Amio 200mg  BID -BP too soft for addition of BB -continue IV  Heparin gtt and restart Eliquis after cath if no CABG needed -he has not missed any doses of DOAC prior to admit and has had uninterrupted anticoagulation so will be able to have DCCV on Friday if no surgery is needed for CAD -will make NPO after MN for DCCV on Friday -Informed Consent   Shared Decision Making/Informed ConsentThe risks (stroke, cardiac arrhythmias rarely resulting in the need for a temporary or permanent pacemaker, skin irritation or burns and complications associated with conscious sedation including aspiration, arrhythmia, respiratory failure and death), benefits (restoration of normal sinus rhythm) and alternatives of a direct current cardioversion were explained in detail to Darius Mitchell and he agrees to proceed.   #Acute on chronic systolic CHF -EF 93-23% on echo 09/2022 -felt to be related to tachy mediated process but also has a coronary Ca score of 8000 -no hx of CP and had Lexi myoview in March 24 with no ischemia and no TID -need to rule out ischemic CM so plan is for [facility] today and if no significant CAD then will get DCCV on Friday and hopefully with restoration of NSR his LVF will improve -BP too soft for addition of GDMT at present>>was on ENtresto 24-26mg  BID and Toprol Xl 25mg  daily at home but now on hold for soft BP -he appears euvolemic on exam -diuretics on hold due to soft BP  #Coronary artery Calcium -coronary Ca score on PV CT showed extensive calcifications in the coronary arteries with total score 8062 (LM 256, LAD 1347, LCx 35.7, RCA 6424) -Lexi myoview 08/2022 with no ischemia and normal TID -he has not had any CP -He has LV dysfunction felt to be tachy mediated from afib but given high Ca burden, recommend proceeding with [facility] prior to DCCV to make sure he does not have obstructive CAD -his last dose of Eliquis 8/13 PM  -LHC planned for later today -no ASA due to DOAC -no BB due to soft BP -continue high dose statin and IV Heparin gtt  HLD -LDL goal <  70 -LDL 58 in 10/5730 -continue Atorvastatin 80mg  daily and Zetia 10mg  daily  HTN -BP soft now -Entresto and Toprol on hold    Total time spent with patient today 35 minutes. This includes reviewing records, evaluating the patient and coordinating care. Face-to-face time >50%.  For questions or updates, please contact [facility] Please consult [email or web address] for contact info under        Signed, Armanda Magic, MD  02/09/2023, 8:50 AM

## 2023-02-09 NOTE — Interval H&P Note (Signed)
History and Physical Interval Note:  02/09/2023 11:38 AM  Darius Mitchell  has presented today for surgery, with the diagnosis of HFrEF  The various methods of treatment have been discussed with the patient and family. After consideration of risks, benefits and other options for treatment, the patient has consented to  Procedure(s): LEFT HEART CATH AND CORONARY ANGIOGRAPHY (N/A) as a surgical intervention.  The patient's history has been reviewed, patient examined, no change in status, stable for surgery.  I have reviewed the patient's chart and labs.  Questions were answered to the patient's satisfaction.    Cath Lab Visit (complete for each Cath Lab visit)  Clinical Evaluation Leading to the Procedure:   ACS: No.  Non-ACS:    Anginal Classification: No Symptoms  Anti-ischemic medical therapy: No Therapy  Non-Invasive Test Results: No non-invasive testing performed  Prior CABG: No previous CABG  [name]

## 2023-02-09 NOTE — Progress Notes (Signed)
Progress Note  Patient Name: Darius Mitchell Date of Encounter: 02/09/2023  [facility] Cardiologist: [facility]   Subjective   Admitted with fatigue and afib with RVR. Denies any chest pain or SOB.  Inpatient Medications    Scheduled Meds:  amiodarone  200 mg Oral BID   atorvastatin  80 mg Oral Daily   ezetimibe  10 mg Oral Daily   levothyroxine  100 mcg Oral Daily   pantoprazole  40 mg Oral Daily   PARoxetine  30 mg Oral Daily   Continuous Infusions:  sodium chloride     heparin 1,100 Units/hr (02/09/23 0449)   PRN Meds: acetaminophen, ondansetron (ZOFRAN) IV   Vital Signs    Vitals:   02/08/23 1953 02/08/23 2337 02/09/23 0544 02/09/23 0731  BP: 98/79 95/63 103/73 90/71  Pulse: (!) 108 (!) 110 (!) 109 100  Resp: 20 20 20 16   Temp: 97.6 F (36.4 C) 97.9 F (36.6 C) 97.7 F (36.5 C) 97.8 F (36.6 C)  TempSrc: Oral Oral Oral Oral  SpO2: 97% 98% 99% 100%  Weight:      Height:        Intake/Output Summary (Last 24 hours) at 02/09/2023 0850 Last data filed at 02/09/2023 0800 Gross per 24 hour  Intake 566.35 ml  Output --  Net 566.35 ml      02/07/2023    8:21 PM 02/07/2023    9:34 AM 02/07/2023    8:51 AM  Last 3 Weights  Weight (lbs) 177 lb 3.2 oz 177 lb 177 lb  Weight (kg) 80.377 kg 80.287 kg 80.287 kg      Telemetry  Atrial fibrillation with HR in the  100-120's- Personally Reviewed  ECG    No new EKG to review - Personally Reviewed  Physical Exam   GEN: Well nourished, well developed in no acute distress HEENT: Normal NECK: No JVD; No carotid bruits LYMPHATICS: No lymphadenopathy CARDIAC:irregularly irregular, no murmurs, rubs, gallops RESPIRATORY:  Clear to auscultation without rales, wheezing or rhonchi  ABDOMEN: Soft, non-tender, non-distended MUSCULOSKELETAL:  No edema; No deformity  SKIN: Warm and dry NEUROLOGIC:  Alert and oriented x 3 PSYCHIATRIC:  Normal affect  Labs    High Sensitivity Troponin:  No results for  input(s): "TROPONINIHS" in the last 720 hours.    Chemistry Recent Labs  Lab 02/07/23 0952 02/08/23 0321  NA 138 139  K 3.1* 4.4  CL 102 104  CO2 26 25  GLUCOSE 90 80  BUN 26* 20  CREATININE 1.54* 1.35*  CALCIUM 9.3 8.9  GFRNONAA 47* 55*  ANIONGAP 10 10     Hematology Recent Labs  Lab 02/07/23 0952 02/08/23 0321 02/09/23 0026  WBC 5.4 4.9 5.1  RBC 3.92* 3.71* 3.78*  HGB 12.4* 11.9* 12.1*  HCT 37.7* 37.0* 37.2*  MCV 96.2 99.7 98.4  MCH 31.6 32.1 32.0  MCHC 32.9 32.2 32.5  RDW 15.7* 15.8* 15.9*  PLT 150 124* 120*    BNPNo results for input(s): "BNP", "PROBNP" in the last 168 hours.   DDimer No results for input(s): "DDIMER" in the last 168 hours.   CHA2DS2-VASc Score = 3   This indicates a 3.2% annual risk of stroke. The patient's score is based upon: CHF History: 1 HTN History: 1 Diabetes History: 0 Stroke History: 0 Vascular Disease History: 0 Age Score: 1 Gender Score: 0      Radiology    DG Chest Portable 1 View  Result Date: 02/07/2023 CLINICAL DATA:  Shortness  of breath EXAM: PORTABLE CHEST 1 VIEW COMPARISON:  11/21/2022 chest x-ray and older FINDINGS: No consolidation, pneumothorax or effusion. No edema. Overlapping cardiac leads. Borderline cardiopericardial silhouette IMPRESSION: Stable mildly enlarged heart.  No consolidation or edema Electronically Signed   By: Karen Kays M.D.   On: 02/07/2023 11:06    Patient Profile     72 y.o. male with paroxysmal A-fib status post DCCV (7/7) and ablation on 7/22, CAD, heart failure with reduced ejection fraction (3   Assessment & Plan    #Persistent atrial flutter with RVR #PAF -s/p afib ablation 01/16/23 -now with atrial flutter with RVR>>see by Dr. Mauri [place] yesterday and admitted due to symptomatic aflutter with RVR -Last does of Eliquis was 8/13 PM and now on IV Heparin for cath prior to DCCV for high coronary Ca score -continue Amio 200mg  BID -BP too soft for addition of BB -continue IV  Heparin gtt and restart Eliquis after cath if no CABG needed -he has not missed any doses of DOAC prior to admit and has had uninterrupted anticoagulation so will be able to have DCCV on Friday if no surgery is needed for CAD -will make NPO after MN for DCCV on Friday -Informed Consent   Shared Decision Making/Informed ConsentThe risks (stroke, cardiac arrhythmias rarely resulting in the need for a temporary or permanent pacemaker, skin irritation or burns and complications associated with conscious sedation including aspiration, arrhythmia, respiratory failure and death), benefits (restoration of normal sinus rhythm) and alternatives of a direct current cardioversion were explained in detail to [name] and he agrees to proceed.   #Acute on chronic systolic CHF -EF 93-23% on echo 09/2022 -felt to be related to tachy mediated process but also has a coronary Ca score of 8000 -no hx of CP and had Lexi myoview in March 24 with no ischemia and no TID -need to rule out ischemic CM so plan is for [facility] today and if no significant CAD then will get DCCV on Friday and hopefully with restoration of NSR his LVF will improve -BP too soft for addition of GDMT at present>>was on ENtresto 24-26mg  BID and Toprol Xl 25mg  daily at home but now on hold for soft BP -he appears euvolemic on exam -diuretics on hold due to soft BP  #Coronary artery Calcium -coronary Ca score on PV CT showed extensive calcifications in the coronary arteries with total score 8062 (LM 256, LAD 1347, LCx 35.7, RCA 6424) -Lexi myoview 08/2022 with no ischemia and normal TID -he has not had any CP -He has LV dysfunction felt to be tachy mediated from afib but given high Ca burden, recommend proceeding with [facility] prior to DCCV to make sure he does not have obstructive CAD -his last dose of Eliquis 8/13 PM  -LHC planned for later today -no ASA due to DOAC -no BB due to soft BP -continue high dose statin and IV Heparin gtt  HLD -LDL goal <  70 -LDL 58 in 10/5730 -continue Atorvastatin 80mg  daily and Zetia 10mg  daily  HTN -BP soft now -Entresto and Toprol on hold    Total time spent with patient today 35 minutes. This includes reviewing records, evaluating the patient and coordinating care. Face-to-face time >50%.  For questions or updates, please contact [facility] Please consult [email or web address] for contact info under        Signed, Armanda Magic, MD  02/09/2023, 8:50 AM

## 2023-02-09 NOTE — Progress Notes (Signed)
    Called by RN with BP dropping to 81/67 post cath. Pressures have run soft throughout admission, given small amount of procedural sedation. LVEDP was 8 on cath, will give 250cc NS bolus. Patient is asymptomatic, no BP medications to hold. Remain on IV heparin until case is discussed at interventional meeting tomorrow. NPO to cardioversion tomorrow.   Janice Coffin, NP-C 02/09/2023, 6:27 PM Pager: [phone]

## 2023-02-09 NOTE — Plan of Care (Signed)
  Problem: Education: Goal: Understanding of disease, treatment, and recovery process will improve Outcome: Progressing   Problem: Activity: Goal: Ability to return to baseline activity level will improve Outcome: Progressing   Problem: Cardiac: Goal: Ability to maintain adequate cardiovascular perfusion will improve Outcome: Progressing Goal: Vascular access site(s) Level 0-1 will be maintained Outcome: Progressing   Problem: Health Behavior/ Discharge Planning: Goal: Ability to safely manage health related needs after discharge Outcome: Progressing   Problem: Education: Goal: Knowledge of General Education information will improve Description: Including pain rating scale, medication(s)/side effects and non-pharmacologic comfort measures Outcome: Progressing   Problem: Health Behavior/Discharge Planning: Goal: Ability to manage health-related needs will improve Outcome: Progressing   Problem: Clinical Measurements: Goal: Ability to maintain clinical measurements within normal limits will improve Outcome: Progressing Goal: Will remain free from infection Outcome: Progressing Goal: Diagnostic test results will improve Outcome: Progressing Goal: Respiratory complications will improve Outcome: Progressing Goal: Cardiovascular complication will be avoided Outcome: Progressing   Problem: Activity: Goal: Risk for activity intolerance will decrease Outcome: Progressing   Problem: Nutrition: Goal: Adequate nutrition will be maintained Outcome: Progressing   Problem: Coping: Goal: Level of anxiety will decrease Outcome: Progressing   Problem: Elimination: Goal: Will not experience complications related to bowel motility Outcome: Progressing Goal: Will not experience complications related to urinary retention Outcome: Progressing   Problem: Pain Managment: Goal: General experience of comfort will improve Outcome: Progressing   Problem: Safety: Goal: Ability to  remain free from injury will improve Outcome: Progressing   Problem: Skin Integrity: Goal: Risk for impaired skin integrity will decrease Outcome: Progressing   Problem: Education: Goal: Understanding of CV disease, CV risk reduction, and recovery process will improve Outcome: Progressing Goal: Individualized Educational Video(s) Outcome: Progressing   Problem: Activity: Goal: Ability to return to baseline activity level will improve Outcome: Progressing   Problem: Cardiovascular: Goal: Ability to achieve and maintain adequate cardiovascular perfusion will improve Outcome: Progressing Goal: Vascular access site(s) Level 0-1 will be maintained Outcome: Progressing   Problem: Health Behavior/Discharge Planning: Goal: Ability to safely manage health-related needs after discharge will improve Outcome: Progressing

## 2023-02-09 NOTE — Progress Notes (Addendum)
ANTICOAGULATION CONSULT NOTE - Initial Consult  Pharmacy Consult for Heparin Indication: atrial fibrillation  Allergies  Allergen Reactions   Codeine Nausea Only    GI Intolerance   Niacin Other (See Comments)    Flushing    Patient Measurements: Height: 5\' 11"  (180.3 cm) Weight: 80.4 kg (177 lb 3.2 oz) IBW/kg (Calculated) : 75.3  Vital Signs: Temp: 97.8 F (36.6 C) (08/15 0731) Temp Source: Oral (08/15 0731) BP: 90/71 (08/15 0731) Pulse Rate: 109 (08/15 0544)  Labs: Recent Labs    02/07/23 0952 02/08/23 0321 02/08/23 1833 02/09/23 0026  HGB 12.4* 11.9*  --  12.1*  HCT 37.7* 37.0*  --  37.2*  PLT 150 124*  --  120*  APTT  --   --  77* 85*  LABPROT  --  19.4*  --   --   INR  --  1.6*  --   --   HEPARINUNFRC  --   --  >1.10* >1.10*  CREATININE 1.54* 1.35*  --   --     Estimated Creatinine Clearance: 51.1 mL/min (A) (by C-G formula based on SCr of 1.35 mg/dL (H)).   Medical History: Past Medical History:  Diagnosis Date   Afib (HCC)    Anxiety and depression 03/25/2014   BMI 30.0-30.9,adult 05/15/2015   Carpal tunnel syndrome of right wrist 10/23/2015   Diverticulosis of colon without hemorrhage 03/17/2016   Essential hypertension, benign 03/25/2014   GERD (gastroesophageal reflux disease)    Hyperlipidemia    Hypothyroidism    Low testosterone 05/15/2015   Paroxysmal atrial fibrillation (HCC) 12/03/2018   Prostate cancer screening encounter, options and risks discussed 04/30/2014   Screening for ischemic heart disease 04/30/2014   Swelling of both lower extremities 11/29/2017   Visit for preventive health examination 04/30/2014    Medications:  Scheduled:   amiodarone  200 mg Oral BID   atorvastatin  80 mg Oral Daily   ezetimibe  10 mg Oral Daily   levothyroxine  100 mcg Oral Daily   pantoprazole  40 mg Oral Daily   PARoxetine  30 mg Oral Daily    Assessment: 73 y.o. male with h/o Afib, Eliquis on hold for LHC, for heparin.  Last dose of  Eliquis 8/13 at 2200. Current plan for LHC on 8/15 and DCCV on 8/16.  Heparin level > 1.1 is not correlating with aPTT 85, which is therapeutic on 1100units/hr. Planning LHC 8/15 and tentative DCCV 8/16.  Goal of Therapy:  aPTT 66-102 sec Heparin level 0.3-0.7 units/ml Monitor platelets by anticoagulation protocol: Yes   Plan:  Continue heparin 1100 units/hr  Daily HL, aPTT until clinically correlating F/u CBC, s/sx bleeding and transition back to Eliquis as able F/u post cath   ADDENDUM 13:00- s/p cath with consult to resume heparin 2hr after TR band removal. Per RN, anticipated removal around 14:45. Restart heparin 1100 units/hr at 17:00. F/u daily aPTT/ heparin level.    Alphia Moh, PharmD, BCPS, BCCP Clinical Pharmacist  Please check AMION for all [facility] Pharmacy phone numbers After 10:00 PM, call Main Pharmacy [phone]

## 2023-02-10 ENCOUNTER — Encounter (HOSPITAL_COMMUNITY): Payer: Self-pay | Admitting: Internal Medicine

## 2023-02-10 ENCOUNTER — Inpatient Hospital Stay (HOSPITAL_COMMUNITY): Payer: Medicare Other | Admitting: Anesthesiology

## 2023-02-10 ENCOUNTER — Encounter (HOSPITAL_COMMUNITY)
Admission: EM | Disposition: A | Discharge status: Home / Self Care | Payer: Self-pay | Source: Home / Self Care | Attending: Internal Medicine

## 2023-02-10 ENCOUNTER — Other Ambulatory Visit: Payer: Self-pay

## 2023-02-10 DIAGNOSIS — I5020 Unspecified systolic (congestive) heart failure: Secondary | ICD-10-CM

## 2023-02-10 DIAGNOSIS — I480 Paroxysmal atrial fibrillation: Secondary | ICD-10-CM | POA: Diagnosis not present

## 2023-02-10 DIAGNOSIS — I110 Hypertensive heart disease with heart failure: Secondary | ICD-10-CM

## 2023-02-10 DIAGNOSIS — I4891 Unspecified atrial fibrillation: Secondary | ICD-10-CM

## 2023-02-10 DIAGNOSIS — R931 Abnormal findings on diagnostic imaging of heart and coronary circulation: Secondary | ICD-10-CM | POA: Diagnosis not present

## 2023-02-10 DIAGNOSIS — Z87891 Personal history of nicotine dependence: Secondary | ICD-10-CM

## 2023-02-10 DIAGNOSIS — I5023 Acute on chronic systolic (congestive) heart failure: Secondary | ICD-10-CM | POA: Diagnosis not present

## 2023-02-10 DIAGNOSIS — I4819 Other persistent atrial fibrillation: Secondary | ICD-10-CM | POA: Diagnosis not present

## 2023-02-10 DIAGNOSIS — I4892 Unspecified atrial flutter: Secondary | ICD-10-CM | POA: Diagnosis not present

## 2023-02-10 HISTORY — PX: CARDIOVERSION: SHX1299

## 2023-02-10 LAB — CBC
HCT: 34.7 % — ABNORMAL LOW (ref 39.0–52.0)
Hemoglobin: 11.6 g/dL — ABNORMAL LOW (ref 13.0–17.0)
MCH: 32 pg (ref 26.0–34.0)
MCHC: 33.4 g/dL (ref 30.0–36.0)
MCV: 95.9 fL (ref 80.0–100.0)
Platelets: 116 10*3/uL — ABNORMAL LOW (ref 150–400)
RBC: 3.62 MIL/uL — ABNORMAL LOW (ref 4.22–5.81)
RDW: 15.9 % — ABNORMAL HIGH (ref 11.5–15.5)
WBC: 6.1 10*3/uL (ref 4.0–10.5)
nRBC: 0 % (ref 0.0–0.2)

## 2023-02-10 LAB — APTT
aPTT: 73 seconds — ABNORMAL HIGH (ref 24–36)
aPTT: 76 seconds — ABNORMAL HIGH (ref 24–36)

## 2023-02-10 LAB — HEPARIN LEVEL (UNFRACTIONATED): Heparin Unfractionated: >1.1 [IU]/mL — ABNORMAL HIGH (ref 0.30–0.70)

## 2023-02-10 LAB — LACTIC ACID, PLASMA: Lactic Acid, Venous: 1.1 mmol/L (ref 0.5–1.9)

## 2023-02-10 SURGERY — CARDIOVERSION
Anesthesia: General

## 2023-02-10 MED ORDER — LIDOCAINE 2% (20 MG/ML) 5 ML SYRINGE
INTRAMUSCULAR | Status: DC | PRN
  Administered 2023-02-10: 80 mg via INTRAVENOUS

## 2023-02-10 MED ORDER — APIXABAN 5 MG PO TABS
5.0000 mg | ORAL_TABLET | Freq: Two times a day (BID) | ORAL | Status: DC
  Administered 2023-02-10 – 2023-02-11 (×3): 5 mg via ORAL
  Filled 2023-02-10 (×3): qty 1

## 2023-02-10 MED ORDER — OXYCODONE HCL 5 MG/5ML PO SOLN: 5.0000 mg | Freq: Once | ORAL | Status: DC | PRN

## 2023-02-10 MED ORDER — SODIUM CHLORIDE 0.9 % IV BOLUS
250.0000 mL | Freq: Once | INTRAVENOUS | Status: AC
  Administered 2023-02-10: 250 mL via INTRAVENOUS

## 2023-02-10 MED ORDER — PROPOFOL 10 MG/ML IV BOLUS
INTRAVENOUS | Status: DC | PRN
  Administered 2023-02-10: 80 mg via INTRAVENOUS

## 2023-02-10 MED ORDER — PROMETHAZINE HCL 25 MG/ML IJ SOLN: 6.2500 mg | INTRAMUSCULAR | Status: DC | PRN

## 2023-02-10 MED ORDER — HYDROMORPHONE HCL 1 MG/ML IJ SOLN: 0.2500 mg | INTRAMUSCULAR | Status: DC | PRN

## 2023-02-10 MED ORDER — OXYCODONE HCL 5 MG PO TABS: 5.0000 mg | ORAL_TABLET | Freq: Once | ORAL | Status: DC | PRN

## 2023-02-10 MED FILL — Lidocaine HCl Local Preservative Free (PF) Inj 1%: INTRAMUSCULAR | Qty: 30 | Status: AC

## 2023-02-10 SURGICAL SUPPLY — 1 items: ELECT DEFIB PAD ADLT CADENCE (PAD) ×1 IMPLANT

## 2023-02-10 NOTE — Transfer of Care (Signed)
Immediate Anesthesia Transfer of Care Note  Patient: Darius Mitchell  Procedure(s) Performed: CARDIOVERSION  Patient Location: PACU  Anesthesia Type:General  Level of Consciousness: awake, alert , and oriented  Airway & Oxygen Therapy: Patient Spontanous Breathing and Patient connected to nasal cannula oxygen  Post-op Assessment: Report given to RN and Post -op Vital signs reviewed and stable  Post vital signs: Reviewed and stable  Last Vitals:  Vitals Value Taken Time  BP    Temp    Pulse    Resp    SpO2      Last Pain:  Vitals:   02/10/23 0800  TempSrc: Temporal  PainSc:          Complications: No notable events documented.

## 2023-02-10 NOTE — Progress Notes (Addendum)
   After cardioversion today, patient had low BP 89/63 at 1130. His BP improved to 95/63 at 1230, but again dropped to 87/65 at 1300. Discussed with Dr. Mayford Knife- she does not feel comfortable discharging patient with these low Bps. Concerned that patient may have dizziness/falls, and he is on blood thinners.  Ordered 250 cc bolus of NS. Reviewed medication list- does not appear that he is on any BP lowering medications at this time  Discussed with patient and wife at bedside who voiced understanding. Encouraged patient to ambulate in the halls with staff, increase his water intake, and eat full meals.   Jonita Albee, PA-C 02/10/2023 2:37 PM

## 2023-02-10 NOTE — Anesthesia Procedure Notes (Signed)
Procedure Name: General with mask airway Date/Time: 02/10/2023 9:18 AM  Performed by: Quentin Ore, CRNAPre-anesthesia Checklist: Patient identified, Emergency Drugs available, Suction available and Timeout performed Patient Re-evaluated:Patient Re-evaluated prior to induction Oxygen Delivery Method: Nasal cannula Preoxygenation: Pre-oxygenation with 100% oxygen Induction Type: IV induction Placement Confirmation: positive ETCO2 Dental Injury: Teeth and Oropharynx as per pre-operative assessment

## 2023-02-10 NOTE — Discharge Instructions (Signed)

## 2023-02-10 NOTE — Plan of Care (Signed)
  Problem: Education: Goal: Understanding of disease, treatment, and recovery process will improve Outcome: Progressing   Problem: Activity: Goal: Ability to return to baseline activity level will improve Outcome: Progressing   Problem: Cardiac: Goal: Ability to maintain adequate cardiovascular perfusion will improve Outcome: Progressing Goal: Vascular access site(s) Level 0-1 will be maintained Outcome: Progressing   Problem: Health Behavior/ Discharge Planning: Goal: Ability to safely manage health related needs after discharge Outcome: Progressing   Problem: Education: Goal: Knowledge of General Education information will improve Description: Including pain rating scale, medication(s)/side effects and non-pharmacologic comfort measures Outcome: Progressing   Problem: Health Behavior/Discharge Planning: Goal: Ability to manage health-related needs will improve Outcome: Progressing   Problem: Clinical Measurements: Goal: Ability to maintain clinical measurements within normal limits will improve Outcome: Progressing Goal: Will remain free from infection Outcome: Progressing Goal: Diagnostic test results will improve Outcome: Progressing Goal: Respiratory complications will improve Outcome: Progressing Goal: Cardiovascular complication will be avoided Outcome: Progressing   Problem: Activity: Goal: Risk for activity intolerance will decrease Outcome: Progressing   Problem: Coping: Goal: Level of anxiety will decrease Outcome: Progressing   Problem: Elimination: Goal: Will not experience complications related to bowel motility Outcome: Progressing Goal: Will not experience complications related to urinary retention Outcome: Progressing   Problem: Pain Managment: Goal: General experience of comfort will improve Outcome: Progressing   Problem: Safety: Goal: Ability to remain free from injury will improve Outcome: Progressing   Problem: Skin Integrity: Goal:  Risk for impaired skin integrity will decrease Outcome: Progressing   Problem: Education: Goal: Understanding of CV disease, CV risk reduction, and recovery process will improve Outcome: Progressing Goal: Individualized Educational Video(s) Outcome: Progressing   Problem: Activity: Goal: Ability to return to baseline activity level will improve Outcome: Progressing   Problem: Cardiovascular: Goal: Ability to achieve and maintain adequate cardiovascular perfusion will improve Outcome: Progressing Goal: Vascular access site(s) Level 0-1 will be maintained Outcome: Progressing   Problem: Health Behavior/Discharge Planning: Goal: Ability to safely manage health-related needs after discharge will improve Outcome: Progressing

## 2023-02-10 NOTE — Anesthesia Postprocedure Evaluation (Signed)
Anesthesia Post Note  Patient: Darius Mitchell  Procedure(s) Performed: CARDIOVERSION     Patient location during evaluation: PACU Anesthesia Type: General Level of consciousness: awake and alert Pain management: pain level controlled Vital Signs Assessment: post-procedure vital signs reviewed and stable Respiratory status: spontaneous breathing, nonlabored ventilation and respiratory function stable Cardiovascular status: blood pressure returned to baseline and stable Postop Assessment: no apparent nausea or vomiting Anesthetic complications: no   No notable events documented.  Last Vitals:  Vitals:   02/10/23 1027 02/10/23 1028  BP: (!) 89/66   Pulse:    Resp: (!) 21   Temp:  36.5 C  SpO2:      Last Pain:  Vitals:   02/10/23 1028  TempSrc: Oral  PainSc:                  Lowella Curb

## 2023-02-10 NOTE — Progress Notes (Signed)
   Heart Failure Stewardship Pharmacist Progress Note   PCP: [name], DO PCP-Cardiologist: None    HPI:  72 yo M with PMH of afib s/p DCCV 7/7 and ablation 7/22, CHF, and CAD.   He was seen by outpatient cardiologist on 8/13 complaining of weakness and hypotension. BP in the office was 78/62. EKG with aflutter with 2-1 AV conduction with rate of 118. Sent to the ED for admission and consideration of cardioversion. Last ECHO 09/29/22 showed LVEF 35-40%, mild concentric LVH, RV moderately reduced. Cardiomyopathy thought to be related to afib. However, last coronary CT with CAC of 8062 (99th percentile for age/race/sex matched controls) raised concern for element of ischemic cardiomyopathy. Taken for [facility] on 8/15 and found to have heavily calcified coronary arteries with severe disease involving the prox/mid LAD and large D1 branch and multiple segments of RCA. Normal LVEDP. S/p successful DCCV on 8/16.    Current HF Medications: None  Prior to admission HF Medications: Diuretic: furosemide 40 mg daily ACE/ARB/ARNI: Entresto 24/26 mg BID  Pertinent Lab Values: Serum creatinine 1.35, BUN 20, Potassium 4.4, Sodium 139, Magnesium 2.1  Vital Signs: Weight: 177 lbs (admission weight: 177 lbs) Blood pressure: 90/60s  Heart rate: 70s  I/O: incomplete  Medication Assistance / Insurance Benefits Check: Does the patient have prescription insurance?  Yes Type of insurance plan: [facility] Medicare  Outpatient Pharmacy:  Prior to admission outpatient [facility] Is the patient willing to use [facility] TOC pharmacy at discharge? Yes Is the patient willing to transition their outpatient pharmacy to utilize a [facility] outpatient pharmacy?   No    Assessment: 1. Chronic systolic CHF (LVEF 35-40%), due to mixed ICM/NICM. NYHA class II symptoms. - Holding PTA furosemide and Entresto with persistent hypotension. BP 90/60s now. Rates now controlled and s/p successful DCCV today. - Consider  adding Jardiance/Farxiga at follow up. Should not impact BP much but did require fluid bolus after cath yesterday for hypotension.    Plan: 1) Medication changes recommended at this time: - Consider adding Farxiga/Jardiance at follow up once fluid status improved  2) Patient assistance: - Currently in the donut hole / coverage gap with his insurance. Copays higher during this period - Initial copay for Farxiga in the donut hole is $146 - Initial copay for Jardiance in the donut hole is $153 - May be eligible for patient assistance if these medications are added or Sherryll Burger is resumed  3)  Education  - Patient has been educated on current HF medications and potential additions to HF medication regimen - Patient verbalizes understanding that over the next few months, these medication doses may change and more medications may be added to optimize HF regimen - Patient has been educated on basic disease state pathophysiology and goals of therapy   Sharen Hones, PharmD, BCPS Heart Failure Engineer, building services Phone [phone]

## 2023-02-10 NOTE — Interval H&P Note (Signed)
History and Physical Interval Note:  02/10/2023 8:17 AM  Darius Mitchell  has presented today for surgery, with the diagnosis of afib.  The various methods of treatment have been discussed with the patient and family. After consideration of risks, benefits and other options for treatment, the patient has consented to  Procedure(s): CARDIOVERSION (N/A) as a surgical intervention.  The patient's history has been reviewed, patient examined, no change in status, stable for surgery.  I have reviewed the patient's chart and labs.  Questions were answered to the patient's satisfaction.    NPO for DCCV, Was on eliquis. Now on heparin. Had LHC yesterday. Has been on continuous AC while here.   Gerri Spore T. Flora Lipps, MD, [facility]  [facility]  [address], [address] [place], [place] [address] [phone]  8:18 AM

## 2023-02-10 NOTE — Anesthesia Preprocedure Evaluation (Signed)
Anesthesia Evaluation  Patient identified by MRN, date of birth, ID band Patient awake    Reviewed: Allergy & Precautions, NPO status , Patient's Chart, lab work & pertinent test results  History of Anesthesia Complications Negative for: history of anesthetic complications  Airway Mallampati: II  TM Distance: >3 FB Neck ROM: Full    Dental  (+) Dental Advisory Given, Teeth Intact   Pulmonary neg shortness of breath, neg sleep apnea, neg COPD, neg recent URI, former smoker   breath sounds clear to auscultation       Cardiovascular hypertension, Pt. on medications (-) angina + CAD  (-) Past MI + dysrhythmias Atrial Fibrillation  Rhythm:Regular Rate:Tachycardia   1. Atrial fibrillation. Left ventricular ejection fraction, by  estimation, is 35 to 40%. Left ventricular ejection fraction by 2D MOD  biplane is 40.6 %. The left ventricle has moderately decreased function.  The left ventricle demonstrates global  hypokinesis. There is mild concentric left ventricular hypertrophy. Left  ventricular diastolic parameters are indeterminate.   2. Right ventricular systolic function is moderately reduced. The right  ventricular size is normal. There is mildly elevated pulmonary artery  systolic pressure.   3. Left atrial size was severely dilated.   4. The mitral valve is degenerative. Moderate mitral valve regurgitation.  No evidence of mitral stenosis.   5. Tricuspid valve regurgitation is mild to moderate.   6. The aortic valve is tricuspid. Aortic valve regurgitation is mild.  Aortic valve sclerosis is present, with no evidence of aortic valve  stenosis.   7. Aortic Normal DTA. There is borderline dilatation of the ascending  aorta, measuring 38 mm.   8. The inferior vena cava is normal in size with greater than 50%  respiratory variability, suggesting right atrial pressure of 3 mmHg.     Neuro/Psych  PSYCHIATRIC DISORDERS Anxiety  Depression    negative neurological ROS     GI/Hepatic Neg liver ROS,GERD  Medicated and Controlled,,  Endo/Other  neg diabetesHypothyroidism    Renal/GU negative Renal ROS     Musculoskeletal   Abdominal   Peds  Hematology  (+) Blood dyscrasia Lab Results      Component                Value               Date                      WBC                      8.2                 01/01/2023                HGB                      12.6 (L)            01/01/2023                HCT                      38.4 (L)            01/01/2023                MCV  96.2                01/01/2023                PLT                      138 (L)             01/01/2023            eliquis   Anesthesia Other Findings   Reproductive/Obstetrics                             Anesthesia Physical Anesthesia Plan  ASA: 3  Anesthesia Plan: General   Post-op Pain Management: Minimal or no pain anticipated   Induction: Intravenous  PONV Risk Score and Plan: 2 and Propofol infusion and Treatment may vary due to age or medical condition  Airway Management Planned: Mask  Additional Equipment: None  Intra-op Plan:   Post-operative Plan:   Informed Consent: I have reviewed the patients History and Physical, chart, labs and discussed the procedure including the risks, benefits and alternatives for the proposed anesthesia with the patient or authorized representative who has indicated his/her understanding and acceptance.     Dental advisory given  Plan Discussed with: CRNA  Anesthesia Plan Comments:         Anesthesia Quick Evaluation

## 2023-02-10 NOTE — CV Procedure (Signed)
   DIRECT CURRENT CARDIOVERSION  NAME:  Darius Mitchell    MRN: [id number] DOB:  October 07, 1950    ADMIT DATE: 02/07/2023  Indication:  Symptomatic atrial fibrillation   Procedure Note:  The patient signed informed consent.  They have had had therapeutic anticoagulation with eliquis/heparin greater than 3 weeks.  Anesthesia was administered by Dr. Hyacinth Meeker.  Adequate airway was maintained throughout and vital followed per protocol.  They were cardioverted x 1 with 200J of biphasic synchronized energy.  They converted to NSR.  There were no apparent complications.  The patient had normal neuro status and respiratory status post procedure with vitals stable as recorded elsewhere.    Follow up: They will continue on current medical therapy and follow up with cardiology as scheduled.  Gerri Spore T. Flora Lipps, MD, [facility]  [facility]  [address], [address] [place], [place] [address] [phone]  9:21 AM

## 2023-02-10 NOTE — Progress Notes (Addendum)
Progress Note  Patient Name: Darius Mitchell Date of Encounter: 02/10/2023  [facility] Cardiologist: [facility]   Subjective   Admitted with fatigue and afib with RVR.  S/p TEE/DCCV this am and maintaining NSR on tele.  Anxious to go home   Inpatient Medications    Scheduled Meds:  amiodarone  200 mg Oral BID   atorvastatin  80 mg Oral Daily   ezetimibe  10 mg Oral Daily   levothyroxine  100 mcg Oral Daily   pantoprazole  40 mg Oral Daily   PARoxetine  30 mg Oral Daily   sodium chloride flush  3 mL Intravenous Q12H   Continuous Infusions:  sodium chloride     heparin 1,100 Units/hr (02/10/23 1100)   PRN Meds: sodium chloride, acetaminophen, ondansetron (ZOFRAN) IV, sodium chloride flush   Vital Signs    Vitals:   02/10/23 1028 02/10/23 1030 02/10/23 1100 02/10/23 1124  BP:  95/68 94/72 97/69   Pulse:      Resp:  13 18 20   Temp: 97.7 F (36.5 C)   97.8 F (36.6 C)  TempSrc: Oral   Oral  SpO2:    97%  Weight:      Height:        Intake/Output Summary (Last 24 hours) at 02/10/2023 1134 Last data filed at 02/10/2023 1100 Gross per 24 hour  Intake 919.85 ml  Output --  Net 919.85 ml      02/07/2023    8:21 PM 02/07/2023    9:34 AM 02/07/2023    8:51 AM  Last 3 Weights  Weight (lbs) 177 lb 3.2 oz 177 lb 177 lb  Weight (kg) 80.377 kg 80.287 kg 80.287 kg      Telemetry  NSR- Personally Reviewed  ECG    No new EKG to review - Personally Reviewed  Physical Exam   GEN: Well nourished, well developed in no acute distress HEENT: Normal NECK: No JVD; No carotid bruits LYMPHATICS: No lymphadenopathy CARDIAC:RRR, no murmurs, rubs, gallops RESPIRATORY:  Clear to auscultation without rales, wheezing or rhonchi  ABDOMEN: Soft, non-tender, non-distended MUSCULOSKELETAL:  No edema; No deformity  SKIN: Warm and dry NEUROLOGIC:  Alert and oriented x 3 PSYCHIATRIC:  Normal affect  Labs    High Sensitivity Troponin:  No results for input(s):  "TROPONINIHS" in the last 720 hours.    Chemistry Recent Labs  Lab 02/07/23 0952 02/08/23 0321  NA 138 139  K 3.1* 4.4  CL 102 104  CO2 26 25  GLUCOSE 90 80  BUN 26* 20  CREATININE 1.54* 1.35*  CALCIUM 9.3 8.9  GFRNONAA 47* 55*  ANIONGAP 10 10     Hematology Recent Labs  Lab 02/08/23 0321 02/09/23 0026 02/10/23 0216  WBC 4.9 5.1 6.1  RBC 3.71* 3.78* 3.62*  HGB 11.9* 12.1* 11.6*  HCT 37.0* 37.2* 34.7*  MCV 99.7 98.4 95.9  MCH 32.1 32.0 32.0  MCHC 32.2 32.5 33.4  RDW 15.8* 15.9* 15.9*  PLT 124* 120* 116*    BNPNo results for input(s): "BNP", "PROBNP" in the last 168 hours.   DDimer No results for input(s): "DDIMER" in the last 168 hours.   CHA2DS2-VASc Score = 3   This indicates a 3.2% annual risk of stroke. The patient's score is based upon: CHF History: 1 HTN History: 1 Diabetes History: 0 Stroke History: 0 Vascular Disease History: 0 Age Score: 1 Gender Score: 0      Radiology    EP STUDY  Result Date:  02/10/2023 See surgical note for result.  CARDIAC CATHETERIZATION  Result Date: 02/09/2023 Conclusions: Heavily calcified coronary arteries with severe disease involving the proximal/mid LAD and large D1 branch as well as multiple segments of the RCA, as detailed below.  There is also moderate mid LCx disease of up to 50%. Normal left ventricular filling pressure. Recommendations: Aggressive secondary prevention of coronary artery disease. Proceed with cardioversion tomorrow, as tachycardia is likely playing a large role in cardiomyopathy. Escalate GDMT for acute HFrEF likely due to mixed ischemic and nonischemic cardiomyopathy. Will review case at tomorrow's HeartTeam meeting; favor medical therapy of coronary artery disease at this time given lack of angina. [name], MD [facility]    Patient Profile     72 y.o. male with paroxysmal A-fib status post DCCV (7/7) and ablation on 7/22, CAD, heart failure with reduced ejection fraction (3    Assessment & Plan    #Persistent atrial flutter with RVR #PAF -s/p afib ablation 01/16/23 -now with atrial flutter with RVR>>see by Dr. Mauri Brooklyn yesterday and admitted due to symptomatic aflutter with RVR -Last does of Eliquis was 8/13 PM and now on IV Heparin for cath prior to DCCV for high coronary Ca score -continue Amio 200mg  BID -BP too soft for addition of BB -he has not missed any doses of DOAC prior to admit and has had uninterrupted anticoagulation during this admission with cath -s/p TEE/DCCV today and now in NSR -convert IV Heparin back to Eliquis 5mg  BID.   #Acute on chronic systolic CHF -EF 40-98% on echo 09/2022 -mixed ischemic/nonischemic in origin -no hx of CP and had Lexi myoview in March 24 with no ischemia and no TID -LHC yesterday with heavily calcified arteries with 75%mRCA, 85%dRCA/PDA, 50%mLCx/pD1, 75% mid D1, 70%prox to mid LAD>>medical management recommended -BP too soft for addition of GDMT at present>>was on ENtresto 24-26mg  BID and Toprol Xl 25mg  daily at home but now on hold for soft BP -he appears euvolemic on exam -no SGLT2i due to soft BP  #Coronary artery Calcium -coronary Ca score on PV CT showed extensive calcifications in the coronary arteries with total score 8062 (LM 256, LAD 1347, LCx 35.7, RCA 6424) -Lexi myoview 08/2022 with no ischemia and normal TID -he has not had any CP -LHC yesterday with heavily calcified arteries with 75%mRCA, 85%dRCA/PDA, 50%mLCx/pD1, 75% mid D1, 70%prox to mid LAD>>medical management recommended -will add on ASA 81mg  daily due to severity of his disease -no BB due to soft BP -continue high dose statin  HLD -LDL goal < 55 given extensive heavy calcified plaque -LDL 58 in 06/2022 -continue Atorvastatin 80mg  daily and Zetia 10mg  daily -repeat FLP and ALT as outpt and if LDL not < 55 then refer to lipid clinic to consider SGLT2i  HTN -BP soft now -Entresto and Toprol on hold    Total time spent with patient today  35 minutes. This includes reviewing records, evaluating the patient and coordinating care. Face-to-face time >50%.  Patient  anxious for discharge home.  BP running soft so will get patient up and walking to make sure BP normalizes and if normal then ok to discharge Discharge meds as follows: Amiodarone 200mg  BID Eliquis 5mg  BID ASA 81mg  daily Atorvastatin 80mg  daily Zetia 10mg  daily Synthroid daily Protonix 40mg  daily Paxil 30mg  daily  Can try to restart GDMT with Entresto/MRA/SGLT2i as outpt if BP allow Plan for followup with  Dr. Mauri Brooklyn in 1 week  For questions or updates, please contact [facility] Please  consult [email or web address] for contact info under        Signed, Armanda Magic, MD  02/10/2023, 11:34 AM

## 2023-02-10 NOTE — Discharge Summary (Signed)
Discharge Summary    Patient ID: Darius Mitchell MRN: [id number]; DOB: Dec 25, 1950  Admit date: 02/07/2023 Discharge date: 02/11/2023  PCP:  [name], DO   [facility] Providers Cardiologist:  Armanda Magic, MD     Discharge Diagnoses    Principal Problem:   Atrial flutter [facility]) Active Problems:   Hyperlipidemia   Atrial fibrillation with RVR (HCC)   Chronic systolic heart failure (HCC)   CAD (coronary artery disease), native coronary artery    Diagnostic Studies/Procedures    Left Heart Catheterization 02/09/23 Conclusions: Heavily calcified coronary arteries with severe disease involving the proximal/mid LAD and large D1 branch as well as multiple segments of the RCA, as detailed below.  There is also moderate mid LCx disease of up to 50%. Normal left ventricular filling pressure.   Recommendations: Aggressive secondary prevention of coronary artery disease. Proceed with cardioversion tomorrow, as tachycardia is likely playing a large role in cardiomyopathy. Escalate GDMT for acute HFrEF likely due to mixed ischemic and nonischemic cardiomyopathy. Will review case at tomorrow's HeartTeam meeting; favor medical therapy of coronary artery disease at this time given lack of angina.   [name], MD [facility]  Diagnostic Dominance: Right  _____________   History of Present Illness     MARCAS Mitchell is a 72 y.o. male with a past medical history of paroxysmal atrial fibrillation s/p ablation in July 2024, elevated coronary calcium score, chronic HFrEF with EF 35-40% in 09/2022.  Patient is followed by Dr. Bing Matter.  Per chart review, he underwent nuclear stress test in 08/2022 that showed no ischemia.  EF was estimated around 31%.  He underwent echocardiogram on 09/29/22 that showed EF 35-40%, moderately reduced RV function, moderate mitral valve regurgitation, mild-moderate tricuspid valve regurgitation, mild aortic valve regurgitation.  He  was set up for A-fib ablation in 12/2022.  CT coronary prior to ablation on 01/09/2023 showed an elevated coronary artery calcium score of 8062 (99th percentile).  He underwent ablation with Dr. Elberta Fortis on 01/16/2023.  Patient was seen by Dr. Bing Matter in office on 8/13.  At that appointment, BP was 78/62.  Patient reported that he felt weak, exhausted.  Patient with found to be in atrial flutter.  Given low BP, patient was instructed to go to the ED.  Plan was to have cardiology admit for cardiac catheterization due to elevated coronary calcium score.  Then, plan was for cardioversion.  Hospital Course   Patient presented to the ED on 8/13 after being seen in as an outpatient by Dr. Bing Matter.  In the ED, patient was asymptomatic and was overall hemodynamically stable.  Heart rates were in the 90s-low 100s and EKG showed atrial flutter with variable block.  Patient reported that he had not missed any of his doses of Eliquis recently.  He remained on amiodarone, his home dose of Lasix.  His Entresto and diltiazem were held due to low blood pressure.  On 8/14, patient was seen by Dr. Mayford Knife.  Given patient's elevated coronary calcium score, she recommended left heart catheterization prior to cardioversion to make sure that he did not have any obstructive CAD.  His Eliquis was held and he was scheduled for left heart catheterization on 8/15.  On 8/15, patient was taken to the Cath Lab with Dr. Okey Dupre.  Cardiac catheterization revealed heavily calcified coronary arteries with severe disease involving the proximal/mid LAD and a large D1 branch as well as multiple segments of the RCA.  There was also moderate left  circumflex disease of up to 50%.  Given patient's lack of angina, medical therapy was favored.  Recommended aggressive secondary prevention of coronary artery disease with plans for cardioversion the next day.  It was suspected that tachycardia was playing a large role in patient's cardiomyopathy.   Postcardiac catheterization, patient's blood pressure did drop to 81/67.  It was noted that blood pressure had been running soft throughout the admission and patient had been given a small amount of procedural sedation.  Patient was given 250 cc normal saline bolus with improvement in BP  On 8/16, patient underwent DCCV with successful conversion to normal sinus rhythm.  There were no apparent complications and patient had normal neurostatus and respiratory status post procedure.  Again, after procedure BP did get a bit soft down to 87/65.  Patient was again given a to 50 cc bolus of normal saline.  Encouraged to ambulate, increase p.o. intake of both food and water.  On 8/17, patient's BP had improved.  He was able to ambulate in the hall without symptoms and overall felt well.  Patient was eager to be discharged.  He was seen by Dr. Bufford Buttner and deemed stable for discharge.  At discharge, GDMT is held to prevent lower BPs.  Dr. Bufford Buttner recommended that patient undergo echocardiogram as an outpatient.  Persistent Atrial Flutter with RVR  Paroxysmal Afib  - Patient had recently undergone afib ablation on 01/16/23 - Seen by Dr. Bing Matter in clinic on 8/13 and was in atrial flutter with RVR - Underwent DCCV on 8/16 and was maintaining NSR prior to DC - Continue eliquis 5 mg BID - Continue amiodarone 200 mg BID  - BP too soft for AV nodal medications at this time  Chronic Systolic CHF  - Echo in 09/2022 showed EF 35-40%. Suspected tachy-mediated  - LHC this admission with heavily calcified arteries with 75%mRCA, 85%dRCA/PDA, 50%mLCx/pD1, 75% mid D1, 70%prox to mid LAD>>medical management recommended  - BP too soft for GDMT at this time- can consider SGLT2i at outpatient follow up  - He has not been on lasix this admission and has been euvolemic on exam  - Instructed patient to take lasix as needed for weight gain, ankle edema, shortness of breath     CAD  HLD - LHC this admission with heavily  calcified arteries with 75%mRCA, 85%dRCA/PDA, 50%mLCx/pD1, 75% mid D1, 70%prox to mid LAD>>medical management recommended  - Has not been on ASA given eliquis use  - Continue lipitor 80 mg daily and zetia 10 mg daily - LDL 58 in 06/2022  - Right radial cath site stable prior to DC. Discussed activity restrictions and radial site care     Did the patient have an acute coronary syndrome (MI, NSTEMI, STEMI, etc) this admission?:  No                               Did the patient have a percutaneous coronary intervention (stent / angioplasty)?:  No.      Patient wants to follow up in the [facility] from now on- Assigned Dr. Mayford Knife as Primary cardiologist as she followed him this admission. Has an appointment with TOC on 8/23 and with general cardiology on 9/4  _____________  Discharge Vitals Blood pressure 102/67, pulse 64, temperature 97.6 F (36.4 C), temperature source Oral, resp. rate 17, height 5\' 11"  (1.803 m), weight 80.4 kg, SpO2 96%.  Filed Weights   02/07/23 0934 02/07/23 2021  Weight: 80.3 kg 80.4 kg    Labs & Radiologic Studies    CBC Recent Labs    02/10/23 0216 02/11/23 0323  WBC 6.1 4.8  HGB 11.6* 10.8*  HCT 34.7* 33.2*  MCV 95.9 95.7  PLT 116* 106*   Basic Metabolic Panel Recent Labs    29/56/21 0323  NA 136  K 4.0  CL 100  CO2 27  GLUCOSE 95  BUN 20  CREATININE 1.25*  CALCIUM 9.1   Liver Function Tests No results for input(s): "AST", "ALT", "ALKPHOS", "BILITOT", "PROT", "ALBUMIN" in the last 72 hours. No results for input(s): "LIPASE", "AMYLASE" in the last 72 hours. High Sensitivity Troponin:   No results for input(s): "TROPONINIHS" in the last 720 hours.  BNP Invalid input(s): "POCBNP" D-Dimer No results for input(s): "DDIMER" in the last 72 hours. Hemoglobin A1C No results for input(s): "HGBA1C" in the last 72 hours. Fasting Lipid Panel No results for input(s): "CHOL", "HDL", "LDLCALC", "TRIG", "CHOLHDL", "LDLDIRECT" in the last 72  hours. Thyroid Function Tests No results for input(s): "TSH", "T4TOTAL", "T3FREE", "THYROIDAB" in the last 72 hours.  Invalid input(s): "FREET3" _____________  EP STUDY  Result Date: 02/10/2023 See surgical note for result.  CARDIAC CATHETERIZATION  Result Date: 02/09/2023 Conclusions: Heavily calcified coronary arteries with severe disease involving the proximal/mid LAD and large D1 branch as well as multiple segments of the RCA, as detailed below.  There is also moderate mid LCx disease of up to 50%. Normal left ventricular filling pressure. Recommendations: Aggressive secondary prevention of coronary artery disease. Proceed with cardioversion tomorrow, as tachycardia is likely playing a large role in cardiomyopathy. Escalate GDMT for acute HFrEF likely due to mixed ischemic and nonischemic cardiomyopathy. Will review case at tomorrow's HeartTeam meeting; favor medical therapy of coronary artery disease at this time given lack of angina. [name], MD [facility]   DG Chest Portable 1 View  Result Date: 02/07/2023 CLINICAL DATA:  Shortness of breath EXAM: PORTABLE CHEST 1 VIEW COMPARISON:  11/21/2022 chest x-ray and older FINDINGS: No consolidation, pneumothorax or effusion. No edema. Overlapping cardiac leads. Borderline cardiopericardial silhouette IMPRESSION: Stable mildly enlarged heart.  No consolidation or edema Electronically Signed   By: Karen Kays M.D.   On: 02/07/2023 11:06   EP STUDY  Result Date: 01/16/2023 SURGEON:  Loman Brooklyn, MD PREPROCEDURE DIAGNOSES: 1. Persistent atrial fibrillation. 2. Typical atrial flutter POSTPROCEDURE DIAGNOSES: 1. Persistent atrial fibrillation. 2. Typical atrial flutter PROCEDURES: 1. Comprehensive electrophysiologic study. 2. Coronary sinus pacing and recording. 3. Three-dimensional mapping of atrial fibrillation (with additional mapping and ablation within the left atrium due to persistence of afib) 4. Ablation of atrial fibrillation  (with additional mapping and ablation within the left atrium due to persistence of afib) 5. Intracardiac echocardiography. 6. Transseptal puncture of an intact septum. 7. Arrhythmia induction with pacing 8. Ablation of typical atrial flutter INTRODUCTION:  JOHNCHARLES TEVEBAUGH is a 72 y.o. male with a history of persistent atrial fibrillation who now presents for EP study and radiofrequency ablation.  The patient reports initially being diagnosed with atrial fibrillation after presenting with symptomatic palpitations and fatgiue.  The patient has failed medical therapy.  The patient therefore presents today for catheter ablation of atrial fibrillation. DESCRIPTION OF PROCEDURE:  Informed written consent was obtained, and the patient was brought to the electrophysiology lab in a fasting state.  The patient was adequately sedated with intravenous medications as outlined in the anesthesia report.  The patient's left and right groins were prepped and  draped in the usual sterile fashion by the EP lab staff.  Using a percutaneous Seldinger technique, two 8-French hemostasis sheaths were placed in the right femoral vein, and one 7 [place] and one 11-French hemostasis sheaths were placed into the left common femoral vein. An esophageal temperature probe was inserted to monitor for heating of the esophagus during the procedure.  Each sheath site was preclosed using an Abbott Perclose and was closed at the end of the case. Direct ultrasound guidance is used for right and left femoral veins with normal vessel patency. Ultrasound images are captured and stored in the patient's chart. Using ultrasound guidance, the Brockenbrough needle and wire were visualized entering the vessel. Catheter Placement:  A 7-French Biosense Webster Decapolar coronary sinus catheter was introduced through the right common femoral vein and advanced into the coronary sinus for recording and pacing from this location.  A luminal esophageal temperature probe  was placed and used for continuous monitoring of the luminal esophageal temperature throughout the procedure as well as to localize the esophagus on fluoroscopy. In addition, the esophagus was directly visualized with intracardiac echo and its positioned marked on Carto.  During ablation at the posterior wall there was limited esophageal heating noted during RF energy delivery with the maximal temperature recorded by the luminal temperature probe of < 38.5 degrees C. Initial Measurements: The patient presented to the electrophysiology lab in atrial flutter. his QRS measured 100 msec and a QT interval of 459 msec. Intracardiac Echocardiography: An 8-French Biosense Webster AcuNav intracardiac echocardiography catheter was introduced through the right common femoral vein and advanced into the right atrium. Intracardiac echocardiography was performed of the left atrium, and a three-dimensional anatomical rendering of the left atrium was performed using CARTO sound technology.  The patient was noted to have a moderate sized left atrium.  The interatrial septum was prominent but not aneurysmal. All 4 pulmonary veins were visualized and noted to have separate ostia.  The pulmonary veins were moderate in size.  The left atrial appendage was visualized and did not reveal thrombus.   There was no evidence of pulmonary vein stenosis. Typical atrial flutter ablation: The ablation catheter was then pulled back into the right atrial and positioned along the cavo-tricuspid isthmus.  3D Mapping along the atrial side of the isthmus was performed.  This demonstrated a standard isthmus.  A series of radiofrequency applications were then delivered along the isthmus at 30-40 Watts with a target index of 450-500.   Complete bidirectional cavotricuspid isthmus block was achieved as confirmed by differential atrial pacing from the low lateral right atrium.  A stimulus to earliest atrial activation across the isthmus measured 149 msec  bi-directionally.  The patient was observe without return of conduction through the isthmus. Transseptal Puncture: The right common femoral vein sheaths were exchanged for one 8.5 [employer] and one Bayless sheath and transseptal access was achieved with the Bayless in a standard fashion using a Bayless needle under fluoroscopy with intracardiac echocardiography confirmation of the transseptal puncture.  Once transseptal access had been achieved, heparin was administered intravenously and intra- arterially in order to maintain an ACT of greater than 350 seconds throughout the procedure.  3D Mapping and Ablation: A 3.5 mm Biosense [employer] Thermocool ablation catheter was advanced into the right atrium.  The transseptal sheath was pulled back into the IVC over a guidewire.  The ablation catheter was advanced across the transseptal hole using the wire as a guide.  The transseptal sheath  was then re-advanced over the guidewire into the left atrium.  A Biosense Webster octarray mapping catheter was introduced through the transseptal sheath and positioned over the mouth of all 4 pulmonary veins.  Three-dimensional electroanatomical mapping was performed using CARTO technology.  This demonstrated electrical activity within all four pulmonary veins at baseline. The patient underwent successful sequential electrical isolation and anatomical encircling of all four pulmonary veins using radiofrequency current with a circular mapping catheter as a guide. A WACA approach was used. Due to persistence of atrial fibrillation, additional left atrial mapping and ablation was performed.  A series of radiofrequency lesions were delivered along the roof and floor of the left atrium in order to create a "standard box" lesion along the posterior wall of the left atrium. Measurements Following Ablation: In sinus rhythm the RR interval was 985 msec, with PR 198 msec, QRS 128 msec, and QT 496 msec.  Following  ablation the AH interval measured 131 msec with an HV interval of 66 msec. Ventricular pacing was performed, which revealed VA dissociation at 600 msec. Rapid atrial pacing was performed, which revealed an AV Wenckebach cycle length of 400 msec.  Electroisolation was then again confirmed in all four pulmonary veins. The procedure was therefore considered completed.  All catheters were removed, and the sheaths were aspirated and flushed.  The patient was transferred to the recovery area for sheath removal per protocol.  Intracardiac echocardiogram revealed no pericardial effusion. EBL<105ml.  There were no early apparent complications. CONCLUSIONS: 1. Atrial fibrillation upon presentation.  2. Successful electrical isolation and anatomical encircling of all four pulmonary veins with radiofrequency current.  A WACA approach was used 3. Additional left atrial ablation was performed with a standard box lesion created along the posterior wall of the left atrium 4. Cavotricuspid isthmus ablation for typical atrial flutter 5. No early apparent complications. Will Daphine Deutscher Camnitz,MD 9:52 AM 01/16/2023  Disposition   Pt is being discharged home today in good condition.  Follow-up Plans & Appointments     Follow-up Information     [facility]. Go in 6 day(s).   Specialty: Cardiology Why: Hospital follow up 02/17/2023 @ 12 noon PLEASE bring a curretn medication list to appointment FREE valet parking, Entrance C, off [employer] information: [address] [place] [address] [phone]               Discharge Instructions     Diet - low sodium heart healthy   Complete by: As directed    Discharge instructions   Complete by: As directed    Radial Site Care Refer to this sheet in the next few weeks. These instructions provide you with information on caring for yourself after your procedure. Your caregiver may also give you  more specific instructions. Your treatment has been planned according to current medical practices, but problems sometimes occur. Call your caregiver if you have any problems or questions after your procedure. HOME CARE INSTRUCTIONS You may shower the day after the procedure. Remove the bandage (dressing) and gently wash the site with plain soap and water. Gently pat the site dry.  Do not apply powder or lotion to the site.  Do not submerge the affected site in water for 3 to 5 days.  Inspect the site at least twice daily.  Do not flex or bend the affected arm for 24 hours.  No lifting over 5 pounds (2.3 kg) for 5 days after your procedure.  Do not drive home if you are discharged the same day of the procedure. Have someone else drive you.  You may drive 24 hours after the procedure unless otherwise instructed by your caregiver.  What to expect: Any bruising will usually fade within 1 to 2 weeks.  Blood that collects in the tissue (hematoma) may be painful to the touch. It should usually decrease in size and tenderness within 1 to 2 weeks.  SEEK IMMEDIATE MEDICAL CARE IF: You have unusual pain at the radial site.  You have redness, warmth, swelling, or pain at the radial site.  You have drainage (other than a small amount of blood on the dressing).  You have chills.  You have a fever or persistent symptoms for more than 72 hours.  You have a fever and your symptoms suddenly get worse.  Your arm becomes pale, cool, tingly, or numb.  You have heavy bleeding from the site. Hold pressure on the site.   Increase activity slowly   Complete by: As directed         Discharge Medications   Allergies as of 02/11/2023       Reactions   Codeine Nausea Only   GI Intolerance   Niacin Other (See Comments)   Flushing        Medication List     STOP taking these medications    diltiazem 30 MG tablet Commonly known as: Cardizem   Entresto 24-26 MG Generic drug: sacubitril-valsartan        TAKE these medications    amiodarone 200 MG tablet Commonly known as: PACERONE TAKE 1 TABLET BY MOUTH TWICE A DAY   atorvastatin 80 MG tablet Commonly known as: LIPITOR TAKE 1 TABLET BY MOUTH EVERY DAY   Centrum Silver Ultra Mens Tabs Take 1 tablet by mouth in the morning.   Eliquis 5 MG Tabs tablet Generic drug: apixaban Take 1 tablet by mouth 2 (two) times daily. What changed: how much to take   ezetimibe 10 MG tablet Commonly known as: ZETIA TAKE 1 TABLET BY MOUTH EVERY DAY   furosemide 40 MG tablet Commonly known as: LASIX Take 1 tablet (40 mg total) by mouth daily as needed for fluid or edema. What changed:  when to take this reasons to take this   levothyroxine 100 MCG tablet Commonly known as: SYNTHROID Take 1 tablet (100 mcg total) by mouth daily.   Neuriva Caps Take 1 capsule by mouth daily.   OMEGA 3 PO Take 1 capsule by mouth daily.   omeprazole 40 MG capsule Commonly known as: PRILOSEC TAKE 1 CAPSULE BY MOUTH EVERY DAY What changed: how much to take   OSTEO BI-FLEX ONE PER DAY PO Take 1 tablet by mouth daily.   PARoxetine 30 MG tablet Commonly known as: PAXIL Take 30 mg by mouth daily.   PRESCRIPTION MEDICATION Take 1 tablet by mouth daily. Nitrate Oxide Booster   Ubiquinol 200 MG Caps Take 1 capsule by mouth daily.   VITAMIN K2-VITAMIN D3 PO Take 1 tablet by mouth daily.           Outstanding Labs/Studies    Duration of Discharge Encounter   Greater than 30 minutes including physician time.  Signed, Jonita Albee, PA-C 02/11/2023, 11:40 AM

## 2023-02-10 NOTE — TOC Transition Note (Signed)
Transition of Care [facility]) - CM/SW Discharge Note   Patient Details  Name: Darius Mitchell MRN: [id number] Date of Birth: 10/29/1950  Transition of Care [facility]) CM/SW Contact:  Leone Haven, RN Phone Number: 02/10/2023, 11:15 AM   Clinical Narrative:    Patient is for possible dc today, he will be started back on eliquis,  wife states she paid 400.00 for it yesterday and was told it was because she has to meet a deductible , then it will go back down to regular price 40.00.  she states he is indep and does not need any HH services or DME.  She will transport him home if he is dc today.         Patient Goals and CMS Choice      Discharge Placement                         Discharge Plan and Services Additional resources added to the After Visit Summary for                                       Social Determinants of Health (SDOH) Interventions SDOH Screenings   Food Insecurity: No Food Insecurity (02/07/2023)  Housing: Low Risk  (02/07/2023)  Transportation Needs: No Transportation Needs (02/07/2023)  Utilities: Not At Risk (02/07/2023)  Alcohol Screen: Low Risk  (11/06/2022)  Depression (PHQ2-9): Low Risk  (07/19/2022)  Financial Resource Strain: Low Risk  (11/06/2022)  Physical Activity: Sufficiently Active (11/06/2022)  Social Connections: Moderately Integrated (11/06/2022)  Stress: Stress Concern Present (11/06/2022)  Tobacco Use: Medium Risk (02/10/2023)     Readmission Risk Interventions     No data to display

## 2023-02-10 NOTE — Progress Notes (Signed)
Heart Failure Nurse Navigator Progress Note  PCP: [name], DO PCP-Cardiologist: None Admission Diagnosis: None Admitted from: Home  Presentation:   Darius Mitchell presented with feeling weak and tired, recently seen by outpatient cardiology  for hypotension. BP in off ice 78/62, Sent to ED, his EKG with aflutter rate 118. Consideration of a cardioversion. Taken for a left heart cath on 8/15 found heavy calcified coronary arteries with severe disease involving the prox/mid LAD. A successful DCCV completed on 8/16.   Patient and wife were educated on the sign and symptoms of heart failure, daily weights, when to call his doctor or go to the ED. Diet/ fluids, taking all medications as prescribed and attending all medical appointments. A HF TOC appointment was scheduled for 02/17/2023 @ 12 noon.   ECHO/ LVEF: 35-40%  Clinical Course:  Past Medical History:  Diagnosis Date   Afib (HCC)    Anxiety and depression 03/25/2014   BMI 30.0-30.9,adult 05/15/2015   Carpal tunnel syndrome of right wrist 10/23/2015   Diverticulosis of colon without hemorrhage 03/17/2016   Essential hypertension, benign 03/25/2014   GERD (gastroesophageal reflux disease)    Hyperlipidemia    Hypothyroidism    Low testosterone 05/15/2015   Paroxysmal atrial fibrillation (HCC) 12/03/2018   Prostate cancer screening encounter, options and risks discussed 04/30/2014   Screening for ischemic heart disease 04/30/2014   Swelling of both lower extremities 11/29/2017   Visit for preventive health examination 04/30/2014     Social History   Socioeconomic History   Marital status: Married    Spouse name: Not on file   Number of children: Not on file   Years of education: Not on file   Highest education level: Bachelor's degree (e.g., BA, AB, BS)  Occupational History   Not on file  Tobacco Use   Smoking status: Former    Current packs/day: 0.00    Average packs/day: 0.3 packs/day for 40.0 years  (10.0 ttl pk-yrs)    Types: Cigarettes    Start date: 05/28/1979    Quit date: 05/28/2019    Years since quitting: 3.7   Smokeless tobacco: Never  Vaping Use   Vaping status: Never Used  Substance and Sexual Activity   Alcohol use: Never   Drug use: Never   Sexual activity: Not on file  Other Topics Concern   Not on file  Social History Narrative   Not on file   Social Determinants of Health   Financial Resource Strain: Low Risk  (11/06/2022)   Overall Financial Resource Strain (CARDIA)    Difficulty of Paying Living Expenses: Not hard at all  Food Insecurity: No Food Insecurity (02/07/2023)   Hunger Vital Sign    Worried About Running Out of Food in the Last Year: Never true    Ran Out of Food in the Last Year: Never true  Transportation Needs: No Transportation Needs (02/07/2023)   PRAPARE - Administrator, Civil Service (Medical): No    Lack of Transportation (Non-Medical): No  Physical Activity: Sufficiently Active (11/06/2022)   Exercise Vital Sign    Days of Exercise per Week: 4 days    Minutes of Exercise per Session: 150+ min  Stress: Stress Concern Present (11/06/2022)   [employer] of Occupational Health - Occupational Stress Questionnaire    Feeling of Stress : To some extent  Social Connections: Moderately Integrated (11/06/2022)   Social Connection and Isolation Panel [NHANES]    Frequency of Communication with Friends and Family: Three  times a week    Frequency of Social Gatherings with Friends and Family: Twice a week    Attends Religious Services: 1 to 4 times per year    Active Member of [employer] or Organizations: No    Attends Engineer, structural: Not on file    Marital Status: Married   Water engineer and Provision:  Detailed education and instructions provided on heart failure disease management including the following:  Signs and symptoms of Heart Failure When to call the physician Importance of daily weights Low sodium  diet Fluid restriction Medication management Anticipated future follow-up appointments  Patient education given on each of the above topics.  Patient acknowledges understanding via teach back method and acceptance of all instructions.  Education Materials:  "Living Better With Heart Failure" Booklet, HF zone tool, & Daily Weight Tracker Tool.  Patient has scale at home: Yes Patient has pill box at home: Yes    High Risk Criteria for Readmission and/or Poor Patient Outcomes: Heart failure hospital admissions (last 6 months): 1  No Show rate: 2% Difficult social situation: No, lives with wife.  Demonstrates medication adherence: Yes Primary Language: English Literacy level: Reading, writing, and comprehension.   Barriers of Care:   Continued HF education Daily weights  Considerations/Referrals:   Referral made to Heart Failure Pharmacist Stewardship: Yes Referral made to Heart Failure CSW/NCM TOC: No Referral made to Heart & Vascular TOC clinic: Yes, 02/17/2023 @ 12 noon  Items for Follow-up on DC/TOC: Continued HF education Daily weights   Rhae Hammock, BSN, RN Heart Failure Print production planner Chat Only

## 2023-02-10 NOTE — Progress Notes (Signed)
ANTICOAGULATION CONSULT NOTE -Follow Up  Pharmacy Consult for Heparin Indication: atrial fibrillation  Allergies  Allergen Reactions   Codeine Nausea Only    GI Intolerance   Niacin Other (See Comments)    Flushing    Patient Measurements: Height: 5\' 11"  (180.3 cm) Weight: 80.4 kg (177 lb 3.2 oz) IBW/kg (Calculated) : 75.3 Heparin DW = 80.4 kg  Vital Signs: Temp: 98.5 F (36.9 C) (08/15 2308) Temp Source: Oral (08/15 2308) BP: 102/77 (08/15 2308) Pulse Rate: 113 (08/15 2308)  Labs: Recent Labs    02/07/23 4132 02/08/23 0321 02/08/23 1833 02/09/23 0026 02/10/23 0213 02/10/23 0216  HGB 12.4* 11.9*  --  12.1*  --  11.6*  HCT 37.7* 37.0*  --  37.2*  --  34.7*  PLT 150 124*  --  120*  --  116*  APTT  --   --  77* 85* 73*  --   LABPROT  --  19.4*  --   --   --   --   INR  --  1.6*  --   --   --   --   HEPARINUNFRC  --   --  >1.10* >1.10* >1.10*  --   CREATININE 1.54* 1.35*  --   --   --   --     Estimated Creatinine Clearance: 51.1 mL/min (A) (by C-G formula based on SCr of 1.35 mg/dL (H)).   Medical History: Past Medical History:  Diagnosis Date   Afib (HCC)    Anxiety and depression 03/25/2014   BMI 30.0-30.9,adult 05/15/2015   Carpal tunnel syndrome of right wrist 10/23/2015   Diverticulosis of colon without hemorrhage 03/17/2016   Essential hypertension, benign 03/25/2014   GERD (gastroesophageal reflux disease)    Hyperlipidemia    Hypothyroidism    Low testosterone 05/15/2015   Paroxysmal atrial fibrillation (HCC) 12/03/2018   Prostate cancer screening encounter, options and risks discussed 04/30/2014   Screening for ischemic heart disease 04/30/2014   Swelling of both lower extremities 11/29/2017   Visit for preventive health examination 04/30/2014    Assessment: 72 y.o. male with h/o Afib, Eliquis held for [facility] 8/15.  Pharmacy consulted for heparin dosing.  Last dose of Eliquis 8/13 at 2200.    Now s/p LHC on 8/15 with plans to "review case at  tomorrow's HeartTeam meeting; favor medical therapy of coronary artery disease at this time given lack of angina."  8/16 AM: heparin level >1.1 and aPTT 73 seconds (therapeutic). Levels not yet correlating will continue to order both. No signs/symptoms of bleeding reported or issues with heparin infusion. Hgb low, stable  Goal of Therapy:  aPTT 66-102 sec Heparin level 0.3-0.7 units/ml Monitor platelets by anticoagulation protocol: Yes   Plan:  Continue heparin at 1100 units/hr, no bolus 8h anti-Xa, aPTT until clinically correlating F/u CBC, s/sx bleeding and transition back to Eliquis as able F/u post DCCV 8/16  Arabella Merles, PharmD. Clinical Pharmacist 02/10/2023 3:38 AM

## 2023-02-11 ENCOUNTER — Other Ambulatory Visit (HOSPITAL_COMMUNITY): Payer: Medicare Other

## 2023-02-11 DIAGNOSIS — I5022 Chronic systolic (congestive) heart failure: Secondary | ICD-10-CM

## 2023-02-11 DIAGNOSIS — I4891 Unspecified atrial fibrillation: Secondary | ICD-10-CM | POA: Diagnosis not present

## 2023-02-11 DIAGNOSIS — I2510 Atherosclerotic heart disease of native coronary artery without angina pectoris: Secondary | ICD-10-CM | POA: Insufficient documentation

## 2023-02-11 LAB — CBC
HCT: 33.2 % — ABNORMAL LOW (ref 39.0–52.0)
Hemoglobin: 10.8 g/dL — ABNORMAL LOW (ref 13.0–17.0)
MCH: 31.1 pg (ref 26.0–34.0)
MCHC: 32.5 g/dL (ref 30.0–36.0)
MCV: 95.7 fL (ref 80.0–100.0)
Platelets: 106 10*3/uL — ABNORMAL LOW (ref 150–400)
RBC: 3.47 MIL/uL — ABNORMAL LOW (ref 4.22–5.81)
RDW: 15.7 % — ABNORMAL HIGH (ref 11.5–15.5)
WBC: 4.8 10*3/uL (ref 4.0–10.5)
nRBC: 0 % (ref 0.0–0.2)

## 2023-02-11 LAB — BASIC METABOLIC PANEL
Anion gap: 9 (ref 5–15)
BUN: 20 mg/dL (ref 8–23)
CO2: 27 mmol/L (ref 22–32)
Calcium: 9.1 mg/dL (ref 8.9–10.3)
Chloride: 100 mmol/L (ref 98–111)
Creatinine, Ser: 1.25 mg/dL — ABNORMAL HIGH (ref 0.61–1.24)
GFR, Estimated: >60 mL/min (ref >60)
Glucose, Bld: 95 mg/dL (ref 70–99)
Potassium: 4 mmol/L (ref 3.5–5.1)
Sodium: 136 mmol/L (ref 135–145)

## 2023-02-11 MED ORDER — FUROSEMIDE 40 MG PO TABS: 40.0000 mg | ORAL_TABLET | Freq: Every day | ORAL | Status: DC | PRN

## 2023-02-13 ENCOUNTER — Telehealth: Payer: Self-pay | Admitting: *Deleted

## 2023-02-13 ENCOUNTER — Encounter (HOSPITAL_COMMUNITY): Payer: Self-pay | Admitting: Cardiovascular Disease

## 2023-02-13 ENCOUNTER — Ambulatory Visit (HOSPITAL_COMMUNITY): Payer: Medicare Other | Admitting: Internal Medicine

## 2023-02-13 NOTE — Transitions of Care (Post Inpatient/ED Visit) (Signed)
   02/13/2023  Name: Darius Mitchell MRN: [id number] DOB: 10/22/1950  Today's TOC FU Call Status: Today's TOC FU Call Status:: Unsuccessful Call (1st Attempt) Unsuccessful Call (1st Attempt) Date: 02/13/23  Attempted to reach the patient regarding the most recent Inpatient visit; left HIPAA compliant voice message requesting call back  Follow Up Plan: Additional outreach attempts will be made to reach the patient to complete the Transitions of Care (Post Inpatient visit) call.   Caryl Pina, RN, BSN, CCRN Alumnus RN CM Care Coordination/ Transition of Care- [facility] Care Management [phone]: direct office

## 2023-02-14 ENCOUNTER — Telehealth: Payer: Self-pay | Admitting: *Deleted

## 2023-02-14 ENCOUNTER — Ambulatory Visit: Payer: Self-pay | Admitting: Emergency Medicine

## 2023-02-14 ENCOUNTER — Encounter: Payer: Self-pay | Admitting: *Deleted

## 2023-02-14 LAB — LIPOPROTEIN A (LPA): Lipoprotein (a): 63.7 nmol/L — ABNORMAL HIGH (ref <75.0)

## 2023-02-14 NOTE — Transitions of Care (Post Inpatient/ED Visit) (Signed)
02/14/2023  Name: CLOUDE BITTEL MRN: [id number] DOB: 08/15/1950  Today's TOC FU Call Status: Today's TOC FU Call Status:: Successful TOC FU Call Completed TOC FU Call Complete Date: 02/14/23  Transition Care Management Follow-up Telephone Call Date of Discharge: 02/11/23 Discharge Facility: [facility]) Type of Discharge: Inpatient Admission Primary Inpatient Discharge Diagnosis:: Atrial Flutter with cardioversion; hypotension How have you been since you were released from the hospital?: Better ("I am doing much better- I am feeling great, and my blood pressures are finally back up to normal and my heart rates are no longer erratic.") Any questions or concerns?: No  Items Reviewed: Did you receive and understand the discharge instructions provided?: Yes (thoroughly reviewed with patient who verbalizes good understanding of same) Medications obtained,verified, and reconciled?: Yes (Medications Reviewed) (Full medication reconciliation/ review completed; no concerns or discrepancies identified; confirmed patient obtained/ is taking all newly Rx'd medications as instructed; self-manages medications and denies questions/ concerns around medications today) Any new allergies since your discharge?: No Dietary orders reviewed?: Yes Type of Diet Ordered:: Heart Healthy, low salt Do you have support at home?: Yes People in Home: spouse Name of Support/Comfort Primary Source: Reports independent in self-care activities; supportive spouse assists as/ if needed/ indicated  Medications Reviewed Today: Medications Reviewed Today     Reviewed by Michaela Corner, RN (Registered Nurse) on 02/14/23 at 1027  Med List Status: <None>   Medication Order Taking? Sig Documenting Provider Last Dose Status Informant  amiodarone (PACERONE) 200 MG tablet [id number] Yes TAKE 1 TABLET BY MOUTH TWICE A DAY Wendling, Jilda Roche, DO Taking Active Self, Pharmacy Records  apixaban (ELIQUIS) 5 MG TABS tablet  [id number] Yes Take 1 tablet by mouth 2 (two) times daily.  Patient taking differently: Take 5 mg by mouth 2 (two) times daily.   Georgeanna Lea, MD Taking Active Self, Pharmacy Records           Med Note Sherrie Mustache, [place] A   Thu Jan 12, 2023 11:52 AM)    atorvastatin (LIPITOR) 80 MG tablet [id number] Yes TAKE 1 TABLET BY MOUTH EVERY DAY  Patient taking differently: Take 80 mg by mouth daily.   [name], DO Taking Active Self, Pharmacy Records  Boswellia-Glucosamine-Vit D (OSTEO BI-FLEX ONE PER DAY PO) [id number] Yes Take 1 tablet by mouth daily. [provider] Taking Active Self, Pharmacy Records  ezetimibe (ZETIA) 10 MG tablet [id number] Yes TAKE 1 TABLET BY MOUTH EVERY DAY  Patient taking differently: Take 10 mg by mouth daily.   [name], DO Taking Active Self, Pharmacy Records  furosemide (LASIX) 40 MG tablet [id number]  Take 1 tablet (40 mg total) by mouth daily as needed for fluid or edema. Jonita Albee, PA-C  Active            Med Note Michaela Corner   Tue Feb 14, 2023 10:22 AM) 02/14/23: Reports during [facility] call has not needed since hospital discharge on 02/11/23  levothyroxine (SYNTHROID) 100 MCG tablet [id number] Yes Take 1 tablet (100 mcg total) by mouth daily. [name], DO Taking Active Self, Pharmacy Records    Discontinued 05/18/20 [phone] (Discontinued by provider)   Misc Natural Products (NEURIVA) CAPS [id number] Yes Take 1 capsule by mouth daily. [provider] Taking Active Self, Pharmacy Records  Multiple Vitamins-Minerals (CENTRUM SILVER ULTRA MENS) TABS [id number] Yes Take 1 tablet by mouth in the morning. [provider] Taking Active Self, Pharmacy Records  Omega-3 Fatty Acids (OMEGA 3  PO) [id number] Yes Take 1 capsule by mouth daily. [provider] Taking Active Self, Pharmacy Records  omeprazole (PRILOSEC) 40 MG capsule [id number] Yes TAKE 1 CAPSULE BY MOUTH EVERY DAY  Patient taking  differently: Take 40 mg by mouth daily.   [name], DO Taking Active Self, Pharmacy Records  PARoxetine (PAXIL) 30 MG tablet [id number] Yes Take 30 mg by mouth daily. [provider] Taking Active Self, Pharmacy Records  PRESCRIPTION MEDICATION [id number] Yes Take 1 tablet by mouth daily. Nitrate Oxide Booster [provider] Taking Active Self, Pharmacy Records  Ubiquinol 200 MG CAPS [id number] Yes Take 1 capsule by mouth daily. [provider] Taking Active Self, Pharmacy Records  Vitamin D-Vitamin K (VITAMIN K2-VITAMIN D3 PO) [id number] Yes Take 1 tablet by mouth daily. [provider] Taking Active Self, Pharmacy Records           Home Care and Equipment/Supplies: Were Home Health Services Ordered?: No Any new equipment or medical supplies ordered?: No  Functional Questionnaire: Do you need assistance with bathing/showering or dressing?: No Do you need assistance with meal preparation?: No Do you need assistance with eating?: No Do you have difficulty maintaining continence: No Do you need assistance with getting out of bed/getting out of a chair/moving?: No Do you have difficulty managing or taking your medications?: No  Follow up appointments reviewed: PCP Follow-up appointment confirmed?: NA (verified not indicated per hospital discharging provider discharge notes) Specialist Hospital Follow-up appointment confirmed?: Yes Date of Specialist follow-up appointment?: 02/17/23 Follow-Up Specialty Provider:: cardiology provider on 02/17/23 and again on 03/01/23 Do you need transportation to your follow-up appointment?: No Do you understand care options if your condition(s) worsen?: Yes-patient verbalized understanding  SDOH Interventions Today    Flowsheet Row Most Recent Value  SDOH Interventions   Food Insecurity Interventions Intervention Not Indicated  Transportation Interventions Intervention Not Indicated  [drives self,  wife  assists as needed]      TOC Interventions Today    Flowsheet Row Most Recent Value  TOC Interventions   TOC Interventions Discussed/Reviewed TOC Interventions Discussed  [Patient declines need for ongoing/ further care coordination outreach,  no care coordination needs identified at time of TOC call today,  provided my direct contact information should questions/ concerns/ needs arise post-TOC call]      Interventions Today    Flowsheet Row Most Recent Value  Chronic Disease   Chronic disease during today's visit Atrial Fibrillation (AFib)  General Interventions   General Interventions Discussed/Reviewed General Interventions Discussed, Doctor Visits, Durable Medical Equipment (DME)  Doctor Visits Discussed/Reviewed Doctor Visits Discussed, PCP, Specialist  Durable Medical Equipment (DME) Other  [confirmed not currently requiring/ using assistive devices]  PCP/Specialist Visits Compliance with follow-up visit  Education Interventions   Education Provided Provided Education  Provided Verbal Education On Medication  [side effects of amiodarone]  Nutrition Interventions   Nutrition Discussed/Reviewed Nutrition Discussed  Pharmacy Interventions   Pharmacy Dicussed/Reviewed Pharmacy Topics Discussed  [Full medication review with updating medication list in EHR per patient report]      Caryl Pina, RN, BSN, CCRN Alumnus RN CM Care Coordination/ Transition of Care- [facility] Care Management [phone]: direct office

## 2023-02-14 NOTE — Heart Team MDD (Signed)
   Heart Team Multi-Disciplinary Discussion  Patient: Darius Mitchell  DOB: July 22, 1950  MRN: [id number]   Date: 02/14/2023  11:25 AM    Attendees: Interventional Cardiology: Peter Sw[place], MD Lorine Bears, MD Everette Rank, MD Shawnie Pons, MD Nanetta Batty, MD [name], MD  Cardiothoracic Surgery: Brynda Greathouse, MD     Patient History: 72 year old male with paroxysmal A-fib s/p DCCV (7/7) and ablation on 7/22, with admission on 02/07/2023 for evaluation of new onset A flutter with RVR, fatigue, SOB, and hypotension. Of note, patient denies chest pain and pressure.     Risk Factors: Hyperlipidemia Additional Risk Factors: HFrEF, A fib, CAD, Calcium score 8000.     Review of Prior Angiography and PCI Procedures: The LHC and coronary angiogram (02/09/2023) images were reviewed and discussed in detail including the results: Heavily calcified coronary arteries with severe disease involving the proximal/mid LAD and large D1 branch as well as multiple segments of the RCA.  There is also moderate mid LCx disease of up to 50%. Ejection fraction noted to be 35 to 40%   Discussion: After presentation, consideration of treatment options occurred, contrasting optimizing guideline directed medical therapy versus PCI attempt. Discussion surrounded the lack of chest pain or pressure present in the patient, the likelihood that tachycardia is playing a profound role in current cardiomyopathy, and plan for DCCV to address A flutter. Consensus from the team was to optimize guideline directed medical therapy at this time and to monitor patient closely in the outpatient setting.     Recommendations: Medical Therapy      Alison Murray, RN  02/14/2023 11:25 AM

## 2023-02-17 ENCOUNTER — Other Ambulatory Visit (HOSPITAL_COMMUNITY): Payer: Self-pay

## 2023-02-17 ENCOUNTER — Encounter (HOSPITAL_COMMUNITY): Payer: Self-pay

## 2023-02-17 ENCOUNTER — Telehealth (HOSPITAL_COMMUNITY): Payer: Self-pay

## 2023-02-17 ENCOUNTER — Ambulatory Visit (HOSPITAL_COMMUNITY)
Admit: 2023-02-17 | Discharge: 2023-02-17 | Disposition: A | Discharge status: Home / Self Care | Payer: Medicare Other | Source: Ambulatory Visit | Attending: Adult Health | Admitting: Adult Health

## 2023-02-17 VITALS — BP 122/76 | HR 63 | Wt 188.2 lb

## 2023-02-17 DIAGNOSIS — R5383 Other fatigue: Secondary | ICD-10-CM

## 2023-02-17 DIAGNOSIS — I2510 Atherosclerotic heart disease of native coronary artery without angina pectoris: Secondary | ICD-10-CM | POA: Diagnosis not present

## 2023-02-17 DIAGNOSIS — I110 Hypertensive heart disease with heart failure: Secondary | ICD-10-CM | POA: Diagnosis not present

## 2023-02-17 DIAGNOSIS — I959 Hypotension, unspecified: Secondary | ICD-10-CM | POA: Diagnosis present

## 2023-02-17 DIAGNOSIS — I480 Paroxysmal atrial fibrillation: Secondary | ICD-10-CM

## 2023-02-17 DIAGNOSIS — R9431 Abnormal electrocardiogram [ECG] [EKG]: Secondary | ICD-10-CM | POA: Insufficient documentation

## 2023-02-17 DIAGNOSIS — I5022 Chronic systolic (congestive) heart failure: Secondary | ICD-10-CM | POA: Insufficient documentation

## 2023-02-17 DIAGNOSIS — I428 Other cardiomyopathies: Secondary | ICD-10-CM | POA: Insufficient documentation

## 2023-02-17 LAB — CBC
HCT: 35.9 % — ABNORMAL LOW (ref 39.0–52.0)
Hemoglobin: 11.8 g/dL — ABNORMAL LOW (ref 13.0–17.0)
MCH: 32.3 pg (ref 26.0–34.0)
MCHC: 32.9 g/dL (ref 30.0–36.0)
MCV: 98.4 fL (ref 80.0–100.0)
Platelets: 117 10*3/uL — ABNORMAL LOW (ref 150–400)
RBC: 3.65 MIL/uL — ABNORMAL LOW (ref 4.22–5.81)
RDW: 15.6 % — ABNORMAL HIGH (ref 11.5–15.5)
WBC: 5.7 10*3/uL (ref 4.0–10.5)
nRBC: 0 % (ref 0.0–0.2)

## 2023-02-17 LAB — BASIC METABOLIC PANEL
Anion gap: 6 (ref 5–15)
BUN: 17 mg/dL (ref 8–23)
CO2: 24 mmol/L (ref 22–32)
Calcium: 8.8 mg/dL — ABNORMAL LOW (ref 8.9–10.3)
Chloride: 105 mmol/L (ref 98–111)
Creatinine, Ser: 1.03 mg/dL (ref 0.61–1.24)
GFR, Estimated: >60 mL/min (ref >60)
Glucose, Bld: 64 mg/dL — ABNORMAL LOW (ref 70–99)
Potassium: 3.6 mmol/L (ref 3.5–5.1)
Sodium: 135 mmol/L (ref 135–145)

## 2023-02-17 MED ORDER — LOSARTAN POTASSIUM 25 MG PO TABS: 25.0000 mg | ORAL_TABLET | Freq: Every day | ORAL | 0 refills | Status: DC

## 2023-02-17 MED ORDER — EMPAGLIFLOZIN 10 MG PO TABS: 10.0000 mg | ORAL_TABLET | Freq: Every day | ORAL | 1 refills | Status: DC

## 2023-02-17 NOTE — Progress Notes (Addendum)
HEART & VASCULAR TRANSITION OF CARE CONSULT NOTE     Referring Physician: Dr Scharlene Gloss  Primary Care: Dr Carmelia Roller Primary Cardiologist:  Dr Bing Matter EP: Dr Estill Dooms   HPI: Referred to clinic by Robet Leu PA for heart failure consultation.   Darius Mitchell is a 72 year old with a history of A fib , s/p a flutter ablation,  A fib, and HFrEF.  On 01/30/23 underwent a fib ablation.   Saw Dr Bing Matter 02/07/23 or an acute visit due to fatigue and low blood pressure. EKG showed A flutter. Due to hypotension and high calcium score [facility] and transferred to [facility]. Admitted 02/07/23 A flutter. Loaded on amiodarone. Cath showed heavily calcified coronaries with recommendations for aggressive secondary prevention.  He then had successful cardioversion with restoration SR. Discharged  on amio 200 mg twice a day.   Overall feeling fine. He has difficulty sleeping. Denies SOB/PND/Orthopnea. No bleeding issues. Appetite ok. No fever or chills. Weight at home has been stable.  Taking all medications.  Cardiac Testing  Cath 02/09/23  Heavily calcified coronary arteries with severe disease involving the proximal/mid LAD and large D1 branch as well as multiple segments of the RCA, as detailed below.  There is also moderate mid LCx disease of up to 50%. Normal left ventricular filling pressure.  Recommendations: Aggressive secondary prevention of coronary artery disease.  Echo 09/2022  1. Atrial fibrillation. Left ventricular ejection fraction, by  estimation, is 35 to 40%. Left ventricular ejection fraction by 2D MOD  biplane is 40.6 %. The left ventricle has moderately decreased function.  The left ventricle demonstrates global  hypokinesis. There is mild concentric left ventricular hypertrophy. Left  ventricular diastolic parameters are indeterminate.   2. Right ventricular systolic function is moderately reduced. The right  ventricular size is normal. There is mildly  elevated pulmonary artery  systolic pressure.   3. Left atrial size was severely dilated.   4. The mitral valve is degenerative. Moderate mitral valve regurgitation.  No evidence of mitral stenosis.   5. Tricuspid valve regurgitation is mild to moderate.   6. The aortic valve is tricuspid. Aortic valve regurgitation is mild.  Aortic valve sclerosis is present, with no evidence of aortic valve  stenosis.   Echo 2021 - LVEF 60-65% RV normal   Review of Systems: [y] = yes, [ ]  = no   General: Weight gain [ ] ; Weight loss [ ] ; Anorexia [ ] ; Fatigue [ ] ; Fever [ ] ; Chills [ ] ; Weakness [ ]   Cardiac: Chest pain/pressure [ ] ; Resting SOB [ ] ; Exertional SOB [ ] ; Orthopnea [ ] ; Pedal Edema [ ] ; Palpitations [ ] ; Syncope [ ] ; Presyncope [ ] ; Paroxysmal nocturnal dyspnea[ ]   Pulmonary: Cough [ ] ; Wheezing[ ] ; Hemoptysis[ ] ; Sputum [ ] ; Snoring [ ]   GI: Vomiting[ ] ; Dysphagia[ ] ; Melena[ ] ; Hematochezia [ ] ; Heartburn[ ] ; Abdominal pain [ ] ; Constipation [ ] ; Diarrhea [ ] ; BRBPR [ ]   GU: Hematuria[ ] ; Dysuria [ ] ; Nocturia[ ]   Vascular: Pain in legs with walking [ ] ; Pain in feet with lying flat [ ] ; Non-healing sores [ ] ; Stroke [ ] ; TIA [ ] ; Slurred speech [ ] ;  Neuro: Headaches[ ] ; Vertigo[ ] ; Seizures[ ] ; Paresthesias[ ] ;Blurred vision [ ] ; Diplopia [ ] ; Vision changes [ ]   Ortho/Skin: Arthritis [ ] ; Joint pain [ ] ; Muscle pain [ ] ; Joint swelling [ ] ; Back Pain [Y ]; Rash [ ]   Psych:  Depression[ ] ; Anxiety[ ]   Heme: Bleeding problems [ ] ; Clotting disorders [ ] ; Anemia [ ]   Endocrine: Diabetes [ ] ; Thyroid dysfunction[Y ]   Past Medical History:  Diagnosis Date   Afib (HCC)    Anxiety and depression 03/25/2014   BMI 30.0-30.9,adult 05/15/2015   Carpal tunnel syndrome of right wrist 10/23/2015   Diverticulosis of colon without hemorrhage 03/17/2016   Essential hypertension, benign 03/25/2014   GERD (gastroesophageal reflux disease)    Hyperlipidemia    Hypothyroidism    Low  testosterone 05/15/2015   Paroxysmal atrial fibrillation (HCC) 12/03/2018   Prostate cancer screening encounter, options and risks discussed 04/30/2014   Screening for ischemic heart disease 04/30/2014   Swelling of both lower extremities 11/29/2017   Visit for preventive health examination 04/30/2014    Current Outpatient Medications  Medication Sig Dispense Refill   amiodarone (PACERONE) 200 MG tablet TAKE 1 TABLET BY MOUTH TWICE A DAY 180 tablet 1   apixaban (ELIQUIS) 5 MG TABS tablet Take 1 tablet by mouth 2 (two) times daily. 180 tablet 1   atorvastatin (LIPITOR) 80 MG tablet TAKE 1 TABLET BY MOUTH EVERY DAY 90 tablet 3   Boswellia-Glucosamine-Vit D (OSTEO BI-FLEX ONE PER DAY PO) Take 1 tablet by mouth daily.     ezetimibe (ZETIA) 10 MG tablet TAKE 1 TABLET BY MOUTH EVERY DAY 90 tablet 3   levothyroxine (SYNTHROID) 100 MCG tablet Take 1 tablet (100 mcg total) by mouth daily. 30 tablet 3   Misc Natural Products (NEURIVA) CAPS Take 1 capsule by mouth daily.     Multiple Vitamins-Minerals (CENTRUM SILVER ULTRA MENS) TABS Take 1 tablet by mouth in the morning.     Omega-3 Fatty Acids (OMEGA 3 PO) Take 1 capsule by mouth daily.     omeprazole (PRILOSEC) 40 MG capsule TAKE 1 CAPSULE BY MOUTH EVERY DAY 90 capsule 3   PARoxetine (PAXIL) 30 MG tablet Take 30 mg by mouth daily.     PRESCRIPTION MEDICATION Take 1 tablet by mouth daily. Nitrate Oxide Booster     Ubiquinol 200 MG CAPS Take 1 capsule by mouth daily.     Vitamin D-Vitamin K (VITAMIN K2-VITAMIN D3 PO) Take 1 tablet by mouth daily.     No current facility-administered medications for this encounter.    Allergies  Allergen Reactions   Codeine Nausea Only    GI Intolerance   Niacin Other (See Comments)    Flushing      Social History   Socioeconomic History   Marital status: Married    Spouse name: Not on file   Number of children: Not on file   Years of education: Not on file   Highest education level: Bachelor's  degree (e.g., BA, AB, BS)  Occupational History   Not on file  Tobacco Use   Smoking status: Former    Current packs/day: 0.00    Average packs/day: 0.3 packs/day for 40.0 years (10.0 ttl pk-yrs)    Types: Cigarettes    Start date: 05/28/1979    Quit date: 05/28/2019    Years since quitting: 3.7   Smokeless tobacco: Never  Vaping Use   Vaping status: Never Used  Substance and Sexual Activity   Alcohol use: Never   Drug use: Never   Sexual activity: Not on file  Other Topics Concern   Not on file  Social History Narrative   Not on file   Social Determinants of Health   Financial Resource Strain: Low Risk  (11/06/2022)  Overall Financial Resource Strain (CARDIA)    Difficulty of Paying Living Expenses: Not hard at all  Food Insecurity: No Food Insecurity (02/14/2023)   Hunger Vital Sign    Worried About Running Out of Food in the Last Year: Never true    Ran Out of Food in the Last Year: Never true  Transportation Needs: No Transportation Needs (02/14/2023)   PRAPARE - Administrator, Civil Service (Medical): No    Lack of Transportation (Non-Medical): No  Physical Activity: Sufficiently Active (11/06/2022)   Exercise Vital Sign    Days of Exercise per Week: 4 days    Minutes of Exercise per Session: 150+ min  Stress: Stress Concern Present (11/06/2022)   [employer] of Occupational Health - Occupational Stress Questionnaire    Feeling of Stress : To some extent  Social Connections: Moderately Integrated (11/06/2022)   Social Connection and Isolation Panel [NHANES]    Frequency of Communication with Friends and Family: Three times a week    Frequency of Social Gatherings with Friends and Family: Twice a week    Attends Religious Services: 1 to 4 times per year    Active Member of [employer] or Organizations: No    Attends Engineer, structural: Not on file    Marital Status: Married  Catering manager Violence: Not At Risk (02/07/2023)   Humiliation,  Afraid, Rape, and Kick questionnaire    Fear of Current or Ex-Partner: No    Emotionally Abused: No    Physically Abused: No    Sexually Abused: No      Family History  Problem Relation Age of Onset   Stroke Mother 32       Deceased   Heart attack Mother    Lymphoma Father 65       Deceased   Heart disease Father    Stroke Maternal Grandmother    Stomach cancer Paternal Grandfather    Healthy Brother        x2   Healthy Sister    Hypertension Daughter    Healthy Daughter     Vitals:   02/17/23 1206  BP: 122/76  Pulse: 63  SpO2: 98%  Weight: 85.4 kg (188 lb 3.2 oz)    PHYSICAL EXAM: General:  Well appearing. No respiratory difficulty. Walked in the clinic. HEENT: normal Neck: supple. no JVD. Carotids 2+ bilat; no bruits. No lymphadenopathy or thryomegaly appreciated. Cor: PMI nondisplaced. Regular rate & rhythm. No rubs, gallops or murmurs. Lungs: clear Abdomen: soft, nontender, nondistended. No hepatosplenomegaly. No bruits or masses. Good bowel sounds. Extremities: no cyanosis, clubbing, rash, R and LLE trace-1+edema Neuro: alert & oriented x 3, cranial nerves grossly intact. moves all 4 extremities w/o difficulty. Affect pleasant.  ECG: SR 63 bpm with occasional PAC   ASSESSMENT & PLAN: 1. Chronic HFmEF ICM, NICM  Echo EF 35-40% . Plan to reepat ECHO in 3 months.  NYHA II GDMT  Diuretic- He does not need loop diuretics.   BB-Hold off with heart rate in the 60s and on amiodarone.  Ace/ARB/ARNI- Add 12.5 mg losartan daily MRA- Consider adding 12.5 mg spiro next visit.  SGLT2i- Add Jardiance 10 mg daily . Discussed purpose and possible side effects.  Discussed heart failure GDMT and goal to optimize HF meds then repeat ECHO in 3-4 months.   2. PAF -12/2022 S/P a flutter Ablation - Had Cardioversion 01/2023 with restoration of SR.   -EKG: NSR today , discussed.  - On amio 200 mg twice  a day. Consider taper per EP.  - Continue eliquis. Patient is on  amiodarone.  - Plan to follow amiodarone screening per guidelines  -Check TSH, Free T3 Free T4 3 months after starting amiodarone.  - Will need yearly CXR, TSH Free T3 Free T4, LFTS, and eye exams.  - Discussed with patient.   3. CAD Myoview 2024 EF 31%  Cath 01/2023 - Multivessel CAD with severe disease involving the proximal/mid LAD and large D1 branch as well as multiple segments of the RCA, as detailed below. There is also moderate mid LCx disease of up to 50%  sevre . Aggressive prevention  On statin+zetia + eliqius.   4. Day time fatiuge Discussed sleep study. He does not want to purse at this time.   Referred to HFSW (PCP, Medications, Transportation, ETOH Abuse, Drug Abuse, Insurance, Financial ): No Refer to Pharmacy: No Refer to Home Health: No Refer to Advanced Heart Failure Clinic:  no  Refer to General Cardiology: Established  Check CBC and BMET  Follow up as needed.  Darius Tamburo NP-C  1:00 PM

## 2023-02-17 NOTE — Telephone Encounter (Signed)
Heart Failure Patient Advocate Encounter  Medication assistance forms for Jardiance have been started. Patient has signed forms.  Provider information will need to be completed before submitting.  Forms have been attached to patient chart under 'Media' tab. Routing to appropriate office.  Stephaine H, CPhT Rx Patient Advocate Phone: [phone] 

## 2023-02-17 NOTE — Patient Instructions (Addendum)
Medication Changes:  START: JARDIANCE 10MG  ONCE DAILY   START: LOSARTAN  12.5MG  DAILY   Lab Work:  Labs done today, your results will be available in MyChart, we will contact you for abnormal readings.  Follow-Up in: AS NEEDED   At the [facility], you and your health needs are our priority. We have a designated team specialized in the treatment of Heart Failure. This Care Team includes your primary Heart Failure Specialized Cardiologist (physician), Advanced Practice Providers (APPs- Physician Assistants and Nurse Practitioners), and Pharmacist who all work together to provide you with the care you need, when you need it.   You may see any of the following providers on your designated Care Team at your next follow up:  Dr. Arvilla Meres Dr. Marca Ancona Dr. Marcos Eke, NP Robbie Lis, [place] [facility] [place], [place] Brynda Peon, NP Karle Plumber, PharmD   Please be sure to bring in all your medications bottles to every appointment.   Need to Contact us:  If you have any questions or concerns before your next appointment please send [place] a message through [place] or call our office at [phone].    TO LEAVE A MESSAGE FOR THE NURSE SELECT OPTION 2, PLEASE LEAVE A MESSAGE INCLUDING: YOUR NAME DATE OF BIRTH CALL BACK NUMBER REASON FOR CALL**this is important as we prioritize the call backs  YOU WILL RECEIVE A CALL BACK THE SAME DAY AS LONG AS YOU CALL BEFORE 4:00 PM

## 2023-02-20 ENCOUNTER — Other Ambulatory Visit (HOSPITAL_COMMUNITY): Payer: Self-pay

## 2023-02-21 ENCOUNTER — Encounter: Payer: Self-pay | Admitting: Family Medicine

## 2023-02-22 NOTE — Progress Notes (Signed)
Acute on chronic heart failure.   Gerri Spore T. Flora Lipps, MD, [facility]  [facility]  [address] [place], [place] [address] [phone]  11:31 AM

## 2023-02-23 ENCOUNTER — Other Ambulatory Visit: Payer: Self-pay | Admitting: Family Medicine

## 2023-02-24 ENCOUNTER — Ambulatory Visit: Payer: Medicare Other | Admitting: Nurse Practitioner

## 2023-02-27 NOTE — Progress Notes (Unsigned)
  Cardiology Office Note    Patient Name: Darius Mitchell Date of Encounter: 02/27/2023  Primary Care Provider:  [name], DO Primary Cardiologist:  Reatha Harps, MD Primary Electrophysiologist: None   Past Medical History    Past Medical History:  Diagnosis Date   Afib [facility])    Anxiety and depression 03/25/2014   BMI 30.0-30.9,adult 05/15/2015   Carpal tunnel syndrome of right wrist 10/23/2015   Diverticulosis of colon without hemorrhage 03/17/2016   Essential hypertension, benign 03/25/2014   GERD (gastroesophageal reflux disease)    Hyperlipidemia    Hypothyroidism    Low testosterone 05/15/2015   Paroxysmal atrial fibrillation (HCC) 12/03/2018   Prostate cancer screening encounter, options and risks discussed 04/30/2014   Screening for ischemic heart disease 04/30/2014   Swelling of both lower extremities 11/29/2017   Visit for preventive health examination 04/30/2014    History of Present Illness  Darius Mitchell is a 72 y.o. male with a PMH of***   During today's visit the patient reports they are ***.  Patient denies chest pain, palpitations, dyspnea, PND, orthopnea, nausea, vomiting, dizziness, syncope, edema, weight gain, or early satiety.  ***Notes:  Review of Systems  Please see the history of present illness.    All other systems reviewed and are otherwise negative except as noted above.  Physical Exam    Wt Readings from Last 3 Encounters:  02/17/23 188 lb 3.2 oz (85.4 kg)  02/07/23 177 lb 3.2 oz (80.4 kg)  02/07/23 177 lb (80.3 kg)   ZO:XWRUE were no vitals filed for this visit.,There is no height or weight on file to calculate BMI. GEN: Well nourished, well developed in no acute distress Neck: No JVD; No carotid bruits Pulmonary: Clear to auscultation without rales, wheezing or rhonchi  Cardiovascular: Normal rate. Regular rhythm. Normal S1. Normal S2.   Murmurs: There is no murmur.  ABDOMEN: Soft, non-tender,  non-distended EXTREMITIES:  No edema; No deformity   EKG/LABS/ Recent Cardiac Studies   ECG personally reviewed by me today - ***  Risk Assessment/Calculations:   {Does this patient have ATRIAL FIBRILLATION?:[id number]}      Lab Results  Component Value Date   WBC 5.7 02/17/2023   HGB 11.8 (L) 02/17/2023   HCT 35.9 (L) 02/17/2023   MCV 98.4 02/17/2023   PLT 117 (L) 02/17/2023   Lab Results  Component Value Date   CREATININE 1.03 02/17/2023   BUN 17 02/17/2023   NA 135 02/17/2023   K 3.6 02/17/2023   CL 105 02/17/2023   CO2 24 02/17/2023   Lab Results  Component Value Date   CHOL 119 07/19/2022   HDL 44.40 07/19/2022   LDLCALC 58 07/19/2022   LDLDIRECT 94.0 10/24/2019   TRIG 83.0 07/19/2022   CHOLHDL 3 07/19/2022    Lab Results  Component Value Date   HGBA1C 5.6 10/25/2019   Assessment & Plan    1.***  2.***  3.***  4.***      Disposition: Follow-up with Reatha Harps, MD or APP in *** months {Are you ordering a CV Procedure (e.g. stress test, cath, DCCV, TEE, etc)?   Press F2        :[id number]}   Signed, Napoleon Form, Leodis Rains, NP 02/27/2023, 3:16 PM [facility]

## 2023-03-01 ENCOUNTER — Ambulatory Visit: Payer: Medicare Other | Attending: Nurse Practitioner | Admitting: Nurse Practitioner

## 2023-03-01 ENCOUNTER — Encounter: Payer: Self-pay | Admitting: Nurse Practitioner

## 2023-03-01 ENCOUNTER — Telehealth: Payer: Self-pay | Admitting: *Deleted

## 2023-03-01 VITALS — BP 106/60 | HR 57 | Ht 71.0 in | Wt 171.8 lb

## 2023-03-01 DIAGNOSIS — I2510 Atherosclerotic heart disease of native coronary artery without angina pectoris: Secondary | ICD-10-CM

## 2023-03-01 DIAGNOSIS — E782 Mixed hyperlipidemia: Secondary | ICD-10-CM

## 2023-03-01 DIAGNOSIS — I5020 Unspecified systolic (congestive) heart failure: Secondary | ICD-10-CM | POA: Diagnosis not present

## 2023-03-01 DIAGNOSIS — I10 Essential (primary) hypertension: Secondary | ICD-10-CM

## 2023-03-01 DIAGNOSIS — R0989 Other specified symptoms and signs involving the circulatory and respiratory systems: Secondary | ICD-10-CM | POA: Diagnosis not present

## 2023-03-01 DIAGNOSIS — I480 Paroxysmal atrial fibrillation: Secondary | ICD-10-CM | POA: Diagnosis not present

## 2023-03-01 LAB — BASIC METABOLIC PANEL
BUN/Creatinine Ratio: 16 (ref 10–24)
BUN: 20 mg/dL (ref 8–27)
CO2: 26 mmol/L (ref 20–29)
Calcium: 10 mg/dL (ref 8.6–10.2)
Chloride: 98 mmol/L (ref 96–106)
Creatinine, Ser: 1.27 mg/dL (ref 0.76–1.27)
Glucose: 79 mg/dL (ref 70–99)
Potassium: 3.4 mmol/L — ABNORMAL LOW (ref 3.5–5.2)
Sodium: 136 mmol/L (ref 134–144)
eGFR: 59 mL/min/{1.73_m2} — ABNORMAL LOW (ref >59)

## 2023-03-01 MED ORDER — AMIODARONE HCL 200 MG PO TABS: 200.0000 mg | ORAL_TABLET | Freq: Every day | ORAL | Status: DC

## 2023-03-01 NOTE — Telephone Encounter (Signed)
Called pt to inform him that Dr. Elberta Fortis is recommending a sleep study. Pt would prefer to discuss this with him at next month's OV. Pt appreciates the call.

## 2023-03-01 NOTE — Patient Instructions (Addendum)
Medication Instructions:  DECREASE Amiodarone to 200mg  Take 1 tablet once a day  STOP Hydrochlorothiazide   *If you need a refill on your cardiac medications before your next appointment, please call your pharmacy*   Lab Work: TODAY-BMET DISCUSS LIPOPROTEIN (LP(a)) WITH YOUR FAMILY  If you have labs (blood work) drawn today and your tests are completely normal, you will receive your results only by: MyChart Message (if you have MyChart) OR A paper copy in the mail If you have any lab test that is abnormal or we need to change your treatment, we will call you to review the results.   Testing/Procedures: NONE ORDERED   Follow-Up: At [facility], you and your health needs are our priority.  As part of our continuing mission to provide you with exceptional heart care, we have created designated Provider Care Teams.  These Care Teams include your primary Cardiologist (physician) and Advanced Practice Providers (APPs -  Physician Assistants and Nurse Practitioners) who all work together to provide you with the care you need, when you need it.  We recommend signing up for the patient portal called "MyChart".  Sign up information is provided on this After Visit Summary.  MyChart is used to connect with patients for Virtual Visits (Telemedicine).  Patients are able to view lab/test results, encounter notes, upcoming appointments, etc.  Non-urgent messages can be sent to your provider as well.   To learn more about what you can do with MyChart, go to [email or web address].    Your next appointment:   1 month(s)  Provider:   Robin Searing, NP     Then, Reatha Harps, MD will plan to see you again in 3 month(s).    Other Instructions You have been referred to LIPID CLINIC.  Check your blood pressure daily for 2 weeks, then contact the office with your readings.  Make sure to check 2 hours after your medications.   AVOID these things for 30 minutes before checking your blood  pressure: No Drinking caffeine. No Drinking alcohol. No Eating. No Smoking. No Exercising.  Five minutes before checking your blood pressure: Pee. Sit in a dining chair. Avoid sitting in a soft couch or armchair. Be quiet. Do not talk.

## 2023-03-02 ENCOUNTER — Other Ambulatory Visit: Payer: Self-pay

## 2023-03-02 ENCOUNTER — Telehealth: Payer: Self-pay | Admitting: Cardiovascular Disease

## 2023-03-02 DIAGNOSIS — I2510 Atherosclerotic heart disease of native coronary artery without angina pectoris: Secondary | ICD-10-CM

## 2023-03-02 NOTE — Telephone Encounter (Signed)
Patient was returning call. Please advise ?

## 2023-03-02 NOTE — Telephone Encounter (Signed)
Please let patient know that his renal function is elevated but stable and potassium is slightly abnormal at 3.4.  (The normal is 3.5-5.2.) this may be primarily caused by starting the HCTZ and as discussed today at your visit we will discontinue that medication.   Plan:  -Please focus on increasing your consumption of foods high in potassium such as potatoes, tomatoes, oranges, apricots.  -Please let me know if you have any further question and we will repeat your electrolytes in 2 weeks.     Robin Searing, NP        Spoke with the patient and gave him his lab results. He voiced understanding.

## 2023-03-03 ENCOUNTER — Ambulatory Visit: Payer: Medicare Other | Admitting: Family Medicine

## 2023-03-03 ENCOUNTER — Other Ambulatory Visit: Payer: Self-pay | Admitting: Family Medicine

## 2023-03-06 ENCOUNTER — Other Ambulatory Visit (HOSPITAL_COMMUNITY): Payer: Self-pay

## 2023-03-06 NOTE — Telephone Encounter (Signed)
Faxed completed sign  by Dr Val EagleJennette Kettle for assistance with [place] [facility]

## 2023-03-06 NOTE — Telephone Encounter (Signed)
Contacted [employer] for status update, no application has been received at this time.  Test claims show that patients next refill would cost $153.73 for 30 day supply.  Please make sure application is faxed to [facility], as program changes will go into effect on 10/1. Thanks!

## 2023-03-07 ENCOUNTER — Ambulatory Visit: Payer: Medicare Other | Admitting: Family Medicine

## 2023-03-09 ENCOUNTER — Telehealth: Payer: Self-pay | Admitting: Pharmacist

## 2023-03-09 ENCOUNTER — Telehealth: Payer: Self-pay

## 2023-03-09 ENCOUNTER — Encounter: Payer: Self-pay | Admitting: Student

## 2023-03-09 ENCOUNTER — Ambulatory Visit: Payer: Medicare Other | Attending: Internal Medicine | Admitting: Student

## 2023-03-09 ENCOUNTER — Other Ambulatory Visit (HOSPITAL_COMMUNITY): Payer: Self-pay

## 2023-03-09 DIAGNOSIS — E782 Mixed hyperlipidemia: Secondary | ICD-10-CM

## 2023-03-09 NOTE — Progress Notes (Signed)
Patient ID: Darius Mitchell                 DOB: 10/07/1950                    MRN: [id number]      HPI: Darius Mitchell is a 72 y.o. male patient referred to lipid clinic by Robin Searing, NP . PMH is significant for CAD s/p, LHC with obstructive heavily calcified multivessel (CT score of 8000), HFrEF, HTN, HLD, ascending arctic aneurysm atrial flutter/PAF s/p flutter ablation   Per [place] FH criteria patient has possible FH ( hx of LDLc >190 and First-degree relative with known LDL-C above the 95th percentile- both his mother and father). Patient presented today with his wife. Patient has lost lot of weight in past 9 months. He is focusing protein and healthy fat intake. He is very active and loves yard work. Goes for walk regularly 30 -40 min every day. Enjoys playing golf twice a week. He tolerates Lipitor 80 mg and Zetia 10 mg well without any problem. Patient reports he has strong family hx of cholesterol problem. His mother got MI in her early age he does not recall age but it was certainly before she turn 22 and she died in her 32's from stroke. His father had cholesterol problem and he had MI in his 28's or 57's. He has twin brother who has cholesterol problem and he is on high intensity statins for past 45 years like him. His daughter is also having cholesterol and hypertension. Her maternal grandmother died from stroke in her 72's. His LDLc level was >190 40 years ago when he was put on atorvastatin. He has been taking it since then.    Current Medications: Lipitor 80 mg daily and Zetia 10 mg daily  Intolerances: none  Risk Factors: strong family history of premature coronary disease hypercholesteremia, CT score 8000. CAD s/p, LHC with obstructive heavily calcified multivessel,HTN, HLD, elevated Lp(a) LDL goal: <55 per AACE/ACE 2020 lipid guideline   Diet: lost 35 lbs in last 9 months. Has been following heart healthy diet    Exercise: walks - 30-45 min everyday, loves yard work   Low  impact weights, loves playing golf   Family History:  Relation Problem Comments  Mother Heart attack   Stroke (Age: 54) Deceased    Father Heart disease   Lymphoma (Age: 10) Deceased    Sister Healthy     Brother Healthy x2    Maternal Grandmother Stroke     Paternal Grandfather Stomach cancer     Daughter Hypertension     Daughter Healthy      Social History:  Alcohol: none  Smoking: never   Labs: Lipid Panel     Component Value Date/Time   CHOL 119 07/19/2022 1318   TRIG 83.0 07/19/2022 1318   HDL 44.40 07/19/2022 1318   CHOLHDL 3 07/19/2022 1318   VLDL 16.6 07/19/2022 1318   LDLCALC 58 07/19/2022 1318   LDLCALC 112 (H) 03/31/2020 0837   LDLDIRECT 94.0 10/24/2019 0733    Past Medical History:  Diagnosis Date   Afib (HCC)    Anxiety and depression 03/25/2014   BMI 30.0-30.9,adult 05/15/2015   Carpal tunnel syndrome of right wrist 10/23/2015   Diverticulosis of colon without hemorrhage 03/17/2016   Essential hypertension, benign 03/25/2014   GERD (gastroesophageal reflux disease)    Hyperlipidemia    Hypothyroidism    Low testosterone 05/15/2015   Paroxysmal atrial fibrillation (HCC)  12/03/2018   Prostate cancer screening encounter, options and risks discussed 04/30/2014   Screening for ischemic heart disease 04/30/2014   Swelling of both lower extremities 11/29/2017   Visit for preventive health examination 04/30/2014    Current Outpatient Medications on File Prior to Visit  Medication Sig Dispense Refill   amiodarone (PACERONE) 200 MG tablet Take 1 tablet (200 mg total) by mouth daily.     apixaban (ELIQUIS) 5 MG TABS tablet Take 1 tablet by mouth 2 (two) times daily. 180 tablet 1   atorvastatin (LIPITOR) 80 MG tablet TAKE 1 TABLET BY MOUTH EVERY DAY 90 tablet 3   Boswellia-Glucosamine-Vit D (OSTEO BI-FLEX ONE PER DAY PO) Take 1 tablet by mouth daily.     empagliflozin (JARDIANCE) 10 MG TABS tablet Take 1 tablet (10 mg total) by mouth daily. 30  tablet 1   ezetimibe (ZETIA) 10 MG tablet TAKE 1 TABLET BY MOUTH EVERY DAY 90 tablet 3   levothyroxine (SYNTHROID) 100 MCG tablet TAKE 1 TABLET BY MOUTH EVERY DAY 90 tablet 1   losartan (COZAAR) 25 MG tablet Take 1 tablet (25 mg total) by mouth daily. 45 tablet 0   Multiple Vitamins-Minerals (CENTRUM SILVER ULTRA MENS) TABS Take 1 tablet by mouth in the morning.     Omega-3 Fatty Acids (OMEGA 3 PO) Take 1 capsule by mouth daily.     omeprazole (PRILOSEC) 40 MG capsule TAKE 1 CAPSULE BY MOUTH EVERY DAY 90 capsule 3   PARoxetine (PAXIL) 30 MG tablet Take 30 mg by mouth daily.     PRESCRIPTION MEDICATION Take 1 tablet by mouth daily. Nitrate Oxide Booster     Ubiquinol 200 MG CAPS Take 1 capsule by mouth daily.     Vitamin D-Vitamin K (VITAMIN K2-VITAMIN D3 PO) Take 1 tablet by mouth daily.     Misc Natural Products (NEURIVA) CAPS Take 1 capsule by mouth daily.     [DISCONTINUED] metoprolol succinate (TOPROL-XL) 25 MG 24 hr tablet TAKE 1 TABLET BY MOUTH ONCE DAILY 90 tablet 2   No current facility-administered medications on file prior to visit.    Allergies  Allergen Reactions   Codeine Nausea Only    GI Intolerance   Niacin Other (See Comments)    Flushing    Assessment/Plan:  1. Hyperlipidemia -  Problem  Hyperlipidemia   Hyperlipidemia Assessment:  LDL goal: < 55 mg/dl last LDLc 58 mg/dl (16/03/9603) Tolerates Zetia and high intensity statins well without any side effects   Family hx of premature ASCVD  and hypercholesterolemia ( mother MI at age before 36, father - MI) when he was diagnosed for hypercholesterolemia - 45 years ago his level was >190 mg/dl and his twin brother has same problem  Per [place] FH criteria patient has possible FH ( hx of LDLc >190 3 point and First-degree relative with known LDL-C above the 95th percentile- parents and twin brother- 1 point) Discussed next potential options (PCSK-9 inhibitors, bempedoic acid and inclisiran); cost, dosing efficacy, side  effects   Patient exercise regularly and follows heart heathy diet    Plan: Continue taking current medications (Lipitor 80 mg daily and Zetia 10 mg daily) Will apply for PA for PCSK9i; will inform patient upon approval Lipid lab due in 2-3 months after starting PCSK9i    Thank you,  Carmela Hurt, Pharm.D [facility] A Division of [facility] [address] [place], [place] [address]  Phone: [phone]; Fax: [phone]

## 2023-03-09 NOTE — Assessment & Plan Note (Signed)
Assessment:  LDL goal: < 55 mg/dl last LDLc 58 mg/dl (16/03/9603) Tolerates Zetia and high intensity statins well without any side effects   Family hx of premature ASCVD  and hypercholesterolemia ( mother MI at age before 68, father - MI) when he was diagnosed for hypercholesterolemia - 45 years ago his level was >190 mg/dl and his twin brother has same problem  Per [place] FH criteria patient has possible FH ( hx of LDLc >190 3 point and First-degree relative with known LDL-C above the 95th percentile- parents and twin brother- 1 point) Discussed next potential options (PCSK-9 inhibitors, bempedoic acid and inclisiran); cost, dosing efficacy, side effects   Patient exercise regularly and follows heart heathy diet    Plan: Continue taking current medications (Lipitor 80 mg daily and Zetia 10 mg daily) Will apply for PA for PCSK9i; will inform patient upon approval Lipid lab due in 2-3 months after starting [facility]

## 2023-03-09 NOTE — Telephone Encounter (Signed)
Contacted [employer] for status update, they are currently processing a backlog of applications and have not processed this fax as of 03/09/23. Will continue to follow up.

## 2023-03-09 NOTE — Telephone Encounter (Signed)
PA request for Repatha has been Submitted. New Encounter created for follow up. For additional info see Pharmacy Prior Auth telephone encounter from 03/09/23.

## 2023-03-09 NOTE — Telephone Encounter (Signed)
Pharmacy Patient Advocate Encounter   Received notification from Physician's Office that prior authorization for REPATHA is required/requested.   Insurance verification completed.   The patient is insured through [facility] .   Per test claim: PA required; PA submitted to [facility] via CoverMyMeds Key/confirmation #/EOC [id number] Status is pending

## 2023-03-09 NOTE — Telephone Encounter (Signed)
Pharmacy Patient Advocate Encounter  Received notification from [facility] that Prior Authorization for REPATHA has been APPROVED from 03/09/23 to 09/06/23. Ran test claim, Copay is $141 (PT IN COVERAGE GAP ([place]UT HOLE)). This test claim was processed through [facility]- copay amounts may vary at other pharmacies due to pharmacy/plan contracts, or as the patient moves through the different stages of their insurance plan.

## 2023-03-12 ENCOUNTER — Other Ambulatory Visit: Payer: Self-pay | Admitting: Family Medicine

## 2023-03-12 DIAGNOSIS — I7300 Raynaud's syndrome without gangrene: Secondary | ICD-10-CM

## 2023-03-13 ENCOUNTER — Encounter: Payer: Self-pay | Admitting: Family Medicine

## 2023-03-13 MED ORDER — REPATHA SURECLICK 140 MG/ML ~~LOC~~ SOAJ: 140.0000 mg | SUBCUTANEOUS | 2 refills | Status: DC

## 2023-03-13 NOTE — Telephone Encounter (Signed)
Patient made aware of approval. Will start using Repatha from next week and follow up lab due May 31 2023.

## 2023-03-13 NOTE — Addendum Note (Signed)
Addended by: Tylene Fantasia on: 03/13/2023 09:59 AM   Modules accepted: Orders

## 2023-03-16 ENCOUNTER — Ambulatory Visit: Payer: Medicare Other

## 2023-03-22 ENCOUNTER — Ambulatory Visit: Payer: Medicare Other | Attending: Nurse Practitioner

## 2023-03-22 DIAGNOSIS — I2510 Atherosclerotic heart disease of native coronary artery without angina pectoris: Secondary | ICD-10-CM | POA: Diagnosis not present

## 2023-03-22 NOTE — Telephone Encounter (Signed)
Contacted [employer] for status update; they are currently processing applications from 9/9 and 9/10. Will follow up, application may need to be faxed again. If we can contact [employer] with the submitting fax number they may be able to process this application faster once received.

## 2023-03-23 ENCOUNTER — Other Ambulatory Visit (HOSPITAL_COMMUNITY): Payer: Medicare Other

## 2023-03-23 LAB — BASIC METABOLIC PANEL
BUN/Creatinine Ratio: 15 (ref 10–24)
BUN: 16 mg/dL (ref 8–27)
CO2: 26 mmol/L (ref 20–29)
Calcium: 9.1 mg/dL (ref 8.6–10.2)
Chloride: 102 mmol/L (ref 96–106)
Creatinine, Ser: 1.09 mg/dL (ref 0.76–1.27)
Glucose: 106 mg/dL — ABNORMAL HIGH (ref 70–99)
Potassium: 3.6 mmol/L (ref 3.5–5.2)
Sodium: 141 mmol/L (ref 134–144)
eGFR: 71 mL/min/{1.73_m2} (ref >59)

## 2023-03-30 NOTE — Progress Notes (Signed)
Cardiology Office Note    Patient Name: Darius Mitchell Date of Encounter: 03/30/2023  Primary Care Provider:  [name], DO Primary Cardiologist:  Darius Harps, MD Primary Electrophysiologist: None   Past Medical History    Past Medical History:  Diagnosis Date   Afib [facility])    Anxiety and depression 03/25/2014   BMI 30.0-30.9,adult 05/15/2015   Carpal tunnel syndrome of right wrist 10/23/2015   Diverticulosis of colon without hemorrhage 03/17/2016   Essential hypertension, benign 03/25/2014   GERD (gastroesophageal reflux disease)    Hyperlipidemia    Hypothyroidism    Low testosterone 05/15/2015   Paroxysmal atrial fibrillation (HCC) 12/03/2018   Prostate cancer screening encounter, options and risks discussed 04/30/2014   Screening for ischemic heart disease 04/30/2014   Swelling of both lower extremities 11/29/2017   Visit for preventive health examination 04/30/2014    History of Present Illness  Darius Mitchell is a 72 y.o. male with a PMH of CAD s/p, LHC with obstructive heavily calcified multivessel (CT score of 8000), CAD, HFrEF, HTN, HLD, ascending arctic aneurysm atrial flutter/PAF s/p flutter ablation (on Eliquis), who presents today for 1 month follow-up.  Darius Mitchell was seen on 03/01/23 for posthospital follow-up after undergoing DCCV and TEE procedure.  During visit patient endorsed no breakthrough palpitations since hospitalization.  His blood pressure was stable and was tolerating amiodarone for rate control.  He was referred to the lipid clinic to discuss PCSK9 due to increased calcium score and to review possible side effects and recent weight loss.  During today's visit the patient reports has been experiencing bouts of sleepiness and night sweats and dizziness.  He also reports hair loss and cold hands and feet since beginning his new medication regimen.  During today's visit patient is feeling well with no complaints of chest pain, shortness of  breath or tachycardia.  His blood pressure is controlled at 118/68 and heart rate is 55 bpm.  His EKG was completed today showing prolonged QT interval.  We discussed possible causes which may be related to Paxil, amiodarone and or omeprazole.  He was recently advised to increase omeprazole to 40 mg daily.  He also recently initiated Repatha which can also have QT prolongation effects.  Patient denies chest pain, palpitations, dyspnea, PND, orthopnea, nausea, vomiting, dizziness, syncope, edema, weight gain, or early satiety.  Review of Systems  Please see the history of present illness.    All other systems reviewed and are otherwise negative except as noted above.  Physical Exam    Wt Readings from Last 3 Encounters:  03/01/23 171 lb 12.8 oz (77.9 kg)  02/17/23 188 lb 3.2 oz (85.4 kg)  02/07/23 177 lb 3.2 oz (80.4 kg)   QI:HKVQQ were no vitals filed for this visit.,There is no height or weight on file to calculate BMI. GEN: Well nourished, well developed in no acute distress Neck: No JVD; No carotid bruits Pulmonary: Clear to auscultation without rales, wheezing or rhonchi  Cardiovascular: Normal rate. Regular rhythm. Normal S1. Normal S2.   Murmurs: There is no murmur.  ABDOMEN: Soft, non-tender, non-distended EXTREMITIES:  No edema; No deformity   EKG/LABS/ Recent Cardiac Studies   ECG personally reviewed by me today sinus bradycardia with left axis deviation and LVH with prolonged QT interval of 512 ms with correction of 490  Risk Assessment/Calculations:    CHA2DS2-VASc Score = 3   This indicates a 3.2% annual risk of stroke. The patient's score is based upon: CHF  History: 1 HTN History: 1 Diabetes History: 0 Stroke History: 0 Vascular Disease History: 0 Age Score: 1 Gender Score: 0         Lab Results  Component Value Date   WBC 5.7 02/17/2023   HGB 11.8 (L) 02/17/2023   HCT 35.9 (L) 02/17/2023   MCV 98.4 02/17/2023   PLT 117 (L) 02/17/2023   Lab Results   Component Value Date   CREATININE 1.09 03/22/2023   BUN 16 03/22/2023   NA 141 03/22/2023   K 3.6 03/22/2023   CL 102 03/22/2023   CO2 26 03/22/2023   Lab Results  Component Value Date   CHOL 119 07/19/2022   HDL 44.40 07/19/2022   LDLCALC 58 07/19/2022   LDLDIRECT 94.0 10/24/2019   TRIG 83.0 07/19/2022   CHOLHDL 3 07/19/2022    Lab Results  Component Value Date   HGBA1C 5.6 10/25/2019   Assessment & Plan    1.Paroxysmal AF: -s/p AF ablation on 12/2022 and DCCV with TEE on 02/09/2023. -Today patient is sinus rhythm with a rate of 55 bpm. -Continue amiodarone 200 mg daily -Continue Eliquis 5 mg twice daily CHA2DS2-VASc Score = 3 [CHF History: 1, HTN History: 1, Diabetes History: 0, Stroke History: 0, Vascular Disease History: 0, Age Score: 1, Gender Score: 0].  Therefore, the patient's annual risk of stroke is 3.2 %.      2. Coronary artery disease: -LHC completed showing heavily calcified three-vessel disease with 75% mid RCA, 85% distal RCA/PDA, 50% mid left circumflex/proximal D1, 75% mid D1 and 70% mid LAD.  -Today patient reports no chest pain or anginal equivalent since previous visit. -Continue GDMT with Repatha 140 mg q. 14 days, Zetia 10 mg daily Vitor 80 mg daily  3.Hyperlipidemia: -Patient's last LDL cholesterol was 58 at goal  -Today patient reports some complaints of dizziness and night sweats since beginning Repatha -I will forward his chart to our Pharm.D. for review and to determine if symptoms are caused by Repatha.  4.Essential hypertension: -Patient's blood pressure today was controlled at 118/68 -Continue losartan 25 mg daily  5.   Chronic HFrEF/ICM: -2D echo completed 09/2022 showing EF of 35-40% -Patient was seen recently by advanced heart failure clinic and started on GDMT with Jardiance 10 mg, losartan 25 mg, with plan to add MRA in the future -Patient will undergo repeat 2D echo in 3 months  6.  QT prolongation: -Patient's QT interval today was  512 ms with a correction of 490 ms -He is currently on Paxil, omeprazole, and amiodarone -Magnesium today and BMET to monitor potassium -I will reach out to his Cardiologist Darius Mitchell for guidance on which medication to discontinue. -Patient was advised to discuss Paxil and omeprazole with his PCP.  Disposition: Follow-up with Darius Harps, MD or APP in hello Dr. Concha Norway    Signed, Napoleon Form, Leodis Rains, NP 03/30/2023, 8:58 AM [facility]

## 2023-03-31 ENCOUNTER — Encounter: Payer: Self-pay | Admitting: Nurse Practitioner

## 2023-03-31 ENCOUNTER — Ambulatory Visit: Payer: Medicare Other | Attending: Nurse Practitioner | Admitting: Nurse Practitioner

## 2023-03-31 VITALS — BP 118/68 | HR 55 | Ht 71.0 in | Wt 173.4 lb

## 2023-03-31 DIAGNOSIS — I2510 Atherosclerotic heart disease of native coronary artery without angina pectoris: Secondary | ICD-10-CM | POA: Diagnosis not present

## 2023-03-31 DIAGNOSIS — I10 Essential (primary) hypertension: Secondary | ICD-10-CM

## 2023-03-31 DIAGNOSIS — E782 Mixed hyperlipidemia: Secondary | ICD-10-CM

## 2023-03-31 DIAGNOSIS — R9431 Abnormal electrocardiogram [ECG] [EKG]: Secondary | ICD-10-CM | POA: Diagnosis not present

## 2023-03-31 DIAGNOSIS — I480 Paroxysmal atrial fibrillation: Secondary | ICD-10-CM

## 2023-03-31 DIAGNOSIS — I5020 Unspecified systolic (congestive) heart failure: Secondary | ICD-10-CM | POA: Diagnosis not present

## 2023-03-31 MED ORDER — OMEPRAZOLE 20 MG PO CPDR: 20.0000 mg | DELAYED_RELEASE_CAPSULE | Freq: Every day | ORAL | 1 refills | Status: DC

## 2023-03-31 NOTE — Patient Instructions (Signed)
Medication Instructions:  DECREASE Prilosec to 20mg  once a day  *If you need a refill on your cardiac medications before your next appointment, please call your pharmacy*   Lab Work: None ordered   Testing/Procedures: None ordered   Follow-Up: At [facility], you and your health needs are our priority.  As part of our continuing mission to provide you with exceptional heart care, we have created designated Provider Care Teams.  These Care Teams include your primary Cardiologist (physician) and Advanced Practice Providers (APPs -  Physician Assistants and Nurse Practitioners) who all work together to provide you with the care you need, when you need it.  We recommend signing up for the patient portal called "MyChart".  Sign up information is provided on this After Visit Summary.  MyChart is used to connect with patients for Virtual Visits (Telemedicine).  Patients are able to view lab/test results, encounter notes, upcoming appointments, etc.  Non-urgent messages can be sent to your provider as well.   To learn more about what you can do with MyChart, go to [email or web address].    Your next appointment:    FOLLOW UP AS SCHEDULED  Provider:   Reatha Harps, MD     Other Instructions

## 2023-04-04 ENCOUNTER — Encounter: Payer: Self-pay | Admitting: Family Medicine

## 2023-04-04 ENCOUNTER — Ambulatory Visit (INDEPENDENT_AMBULATORY_CARE_PROVIDER_SITE_OTHER): Payer: Medicare Other | Admitting: Family Medicine

## 2023-04-04 VITALS — BP 120/70 | HR 61 | Temp 97.8°F | Ht 71.0 in | Wt 174.0 lb

## 2023-04-04 DIAGNOSIS — Z789 Other specified health status: Secondary | ICD-10-CM | POA: Diagnosis not present

## 2023-04-04 DIAGNOSIS — R61 Generalized hyperhidrosis: Secondary | ICD-10-CM | POA: Diagnosis not present

## 2023-04-04 DIAGNOSIS — E782 Mixed hyperlipidemia: Secondary | ICD-10-CM | POA: Diagnosis not present

## 2023-04-04 DIAGNOSIS — F411 Generalized anxiety disorder: Secondary | ICD-10-CM | POA: Diagnosis not present

## 2023-04-04 DIAGNOSIS — E039 Hypothyroidism, unspecified: Secondary | ICD-10-CM | POA: Diagnosis not present

## 2023-04-04 MED ORDER — PAROXETINE HCL 20 MG PO TABS: ORAL_TABLET | ORAL | 0 refills | Status: DC

## 2023-04-04 NOTE — Progress Notes (Signed)
Chief Complaint  Patient presents with   discuss medication/testosterone and colonoscopy    Night sweats    Subjective: Patient is a 72 y.o. male here for f/u.  He is here with his spouse.  The patient received a notification from the pharmacy team stating he needs to take his levothyroxine 100 mcg daily at noon rather than in the morning as he has been doing.  He does not take any 3 times daily medications.  He is wondering if this would affect his levels.  These were last checked 3 months ago.  He has not changed his dosing just yet.  He is taking omeprazole 20 mg daily.  He has been clearing his throat due to the symptoms.  He has associated night sweats and some difficulty sleeping.  He is wondering if his testosterone levels are contributing to this.  He did have hypogonadism in the past and took the cream.  It was a pain for him to put on daily so he ended up stopping it.  The patient has been having night sweats around 3 days/week.  He does have a history of reflux.  No history of diabetes.  Eating and drinking is normal overall.  He keeps the temperature at 71 F he does not think it is related to this.  He also asked me about supplements such as vitamin D, fish oil, multivitamin, and ubiquinol.  Past Medical History:  Diagnosis Date   Afib (HCC)    Anxiety and depression 03/25/2014   BMI 30.0-30.9,adult 05/15/2015   Carpal tunnel syndrome of right wrist 10/23/2015   Diverticulosis of colon without hemorrhage 03/17/2016   Essential hypertension, benign 03/25/2014   GERD (gastroesophageal reflux disease)    Hyperlipidemia    Hypothyroidism    Low testosterone 05/15/2015   Paroxysmal atrial fibrillation (HCC) 12/03/2018   Prostate cancer screening encounter, options and risks discussed 04/30/2014   Screening for ischemic heart disease 04/30/2014   Swelling of both lower extremities 11/29/2017   Visit for preventive health examination 04/30/2014    Objective: BP 120/70 (BP  Location: Right Arm, Patient Position: Sitting, Cuff Size: Normal)   Pulse 61   Temp 97.8 F (36.6 C) (Oral)   Ht 5\' 11"  (1.803 m)   Wt 174 lb (78.9 kg)   SpO2 99%   BMI 24.27 kg/m  General: Awake, appears stated age Neuro: Gait is normal Lungs: No accessory muscle use Psych: Age appropriate judgment and insight, normal affect and mood  Assessment and Plan: Hypothyroidism, unspecified type - Plan: TSH, T4, free  Night sweats - Plan: CBC, Comprehensive metabolic panel, Testosterone  GAD (generalized anxiety disorder)  Mixed hyperlipidemia  Takes dietary supplements  Chronic, probably stable.  Check above.  As long as he is consistent with his medication regimen, I do not think he needs to alter what he is doing assuming his levels are within optimal range.  I think dosing the day went add to his burden. Check above labs.  Hopefully stopping some medication will be helpful as well.  Follow-up in 1 month.  If stopping medications has no effect and his labs are unremarkable, would consider changing his omeprazole as reflux is the cause of night sweats. Chronic, stable.  His main stressors such as his job and a wedding for his daughter are no longer situations.  He is stable on Paxil 30 mg daily and is wondering if he could come off of it.  I sent in 20 mg tabs for which he will  take 1 daily for 2 weeks and then take half a tab daily for 2 weeks and then stop.  He will let me know if he has any issues. Chronic, stable.  The cardiology team recently added Repatha.  He has a future lipid panel ordered after he has been on it for 3 months.  He will stay on Lipitor 80 mg daily.  In light of this, I will take with Zetia 10 mg daily.  If the cardiology team wishes to re-add it, will defer to them.  Counseled on diet and exercise. I told him he could probably stop his multivitamin and fish oil.  He will stop one of the time and add it back if he notices a detrimental effect.  He will continue his  vitamin D and co-Q10. The patient and his spouse voiced understanding and agreement to the plan.  I spent 41 minutes with the patient discussing the above plans in addition to reviewing his chart on the same day of the visit.  Jilda Roche Sumner, DO 04/04/23  4:52 PM

## 2023-04-04 NOTE — Patient Instructions (Addendum)
Give [place] 2-3 business days to get the results of your labs back.   Keep the diet clean and stay active.  Stay consistent with your medication.  We are weaning down on your Paxil.   OK to stop the Zetia.   Let us know if you need anything.

## 2023-04-05 NOTE — Telephone Encounter (Addendum)
Contacted [employer] for update. They are still processing a backlog of applications and are unable to provide status for this patient at this time.

## 2023-04-19 ENCOUNTER — Ambulatory Visit: Payer: Medicare Other | Attending: Cardiology | Admitting: Cardiology

## 2023-04-19 ENCOUNTER — Encounter: Payer: Self-pay | Admitting: Cardiology

## 2023-04-19 VITALS — BP 136/84 | HR 58 | Ht 71.0 in | Wt 174.8 lb

## 2023-04-19 DIAGNOSIS — D6869 Other thrombophilia: Secondary | ICD-10-CM | POA: Diagnosis not present

## 2023-04-19 DIAGNOSIS — I4819 Other persistent atrial fibrillation: Secondary | ICD-10-CM

## 2023-04-19 DIAGNOSIS — I5022 Chronic systolic (congestive) heart failure: Secondary | ICD-10-CM

## 2023-04-19 DIAGNOSIS — I2510 Atherosclerotic heart disease of native coronary artery without angina pectoris: Secondary | ICD-10-CM

## 2023-04-19 NOTE — Progress Notes (Signed)
  Electrophysiology Office Note:   Date:  04/19/2023  ID:  Darius Mitchell, DOB 10-19-1950, MRN [id number]  Primary Cardiologist: Reatha Harps, MD Electrophysiologist: None      History of Present Illness:   Darius Mitchell is a 72 y.o. male with h/o atrial fibrillation, hyperlipidemia seen today for routine electrophysiology followup.   He is post atrial fibrillation ablation 01/16/2023.  He was admitted August 2024 post ablation with rapid atrial fibrillation.  Catheterization at the time showed severely calcified coronary arteries but no evidence of obstructive disease.  He had a cardioversion and was loaded on amiodarone.  Since last being seen in our clinic the patient reports doing well.  He notes no further fatigue or shortness of breath.  He has no chest pain.  He is able to do all of his daily activities without restriction.  His Jardiance prescription is out.  He is having some side effects that he attributes potentially to this.  He sees his general cardiologist in a week.  This can be discussed at that visit.  he denies chest pain, palpitations, dyspnea, PND, orthopnea, nausea, vomiting, dizziness, syncope, edema, weight gain, or early satiety.   Review of systems complete and found to be negative unless listed in HPI.   EP Information / Studies Reviewed:    EKG is not ordered today. EKG from 03/31/23 reviewed which showed sinus rhythm, rate 55        Risk Assessment/Calculations:    CHA2DS2-VASc Score = 3   This indicates a 3.2% annual risk of stroke. The patient's score is based upon: CHF History: 1 HTN History: 1 Diabetes History: 0 Stroke History: 0 Vascular Disease History: 0 Age Score: 1 Gender Score: 0             Physical Exam:   VS:  BP 136/84 (BP Location: Left Arm, Patient Position: Sitting, Cuff Size: Normal)   Pulse (!) 58   Ht 5\' 11"  (1.803 m)   Wt 174 lb 12.8 oz (79.3 kg)   SpO2 97%   BMI 24.38 kg/m    Wt Readings from Last 3 Encounters:   04/19/23 174 lb 12.8 oz (79.3 kg)  04/04/23 174 lb (78.9 kg)  03/31/23 173 lb 6.4 oz (78.7 kg)     GEN: Well nourished, well developed in no acute distress NECK: No JVD; No carotid bruits CARDIAC: Regular rate and rhythm, no murmurs, rubs, gallops RESPIRATORY:  Clear to auscultation without rales, wheezing or rhonchi  ABDOMEN: Soft, non-tender, non-distended EXTREMITIES:  No edema; No deformity   ASSESSMENT AND PLAN:    1.  Chronic systolic heart failure: Due to mixed cardiomyopathy.  Currently on medical therapy per heart failure clinic.  2.  Persistent atrial fibrillation/flutter: Status post ablation 01/16/2023.  Has been loaded on amiodarone post ablation.  He has remained in sinus rhythm.  Carsten Carstarphen stop amiodarone today.  3.  Secondary hypercoagulable state: Currently on Eliquis for atrial fibrillation  4.  Coronary artery disease: Elevated calcium score.  Plan for medical management.  Follow up with Afib Clinic in 6 months  Signed, Carlye Panameno Jorja Loa, MD

## 2023-04-19 NOTE — Patient Instructions (Signed)
Medication Instructions:  Your physician recommends that you continue on your current medications as directed. Please refer to the Current Medication list given to you today.  *If you need a refill on your cardiac medications before your next appointment, please call your pharmacy*   Lab Work: None ordered   Testing/Procedures: None ordered   Follow-Up: At [facility], you and your health needs are our priority.  As part of our continuing mission to provide you with exceptional heart care, we have created designated Provider Care Teams.  These Care Teams include your primary Cardiologist (physician) and Advanced Practice Providers (APPs -  Physician Assistants and Nurse Practitioners) who all work together to provide you with the care you need, when you need it.  Your next appointment:   6 month(s)  The format for your next appointment:   In Person  Provider:   You will follow up in the Atrial Fibrillation Clinic located at [facility]. Your provider will be: Clint R. Fenton, PA-C or Lake Bells, PA-C    Thank you for choosing [facility]!!   Dory Horn, RN [phone]

## 2023-04-20 ENCOUNTER — Telehealth: Payer: Self-pay | Admitting: Cardiovascular Disease

## 2023-04-20 NOTE — Telephone Encounter (Signed)
Page 4 of the application needs to be filled out for the provider. States it can be Fax #[phone]. Please Advise

## 2023-04-21 NOTE — Telephone Encounter (Signed)
No answer when I called. Left vm to call back so we can clarify if pt needs help paying for meds and which med.

## 2023-04-27 ENCOUNTER — Other Ambulatory Visit: Payer: Self-pay | Admitting: Family Medicine

## 2023-05-01 ENCOUNTER — Other Ambulatory Visit (HOSPITAL_BASED_OUTPATIENT_CLINIC_OR_DEPARTMENT_OTHER): Payer: Self-pay

## 2023-05-01 ENCOUNTER — Other Ambulatory Visit: Payer: Self-pay | Admitting: Cardiology

## 2023-05-01 MED ORDER — APIXABAN 5 MG PO TABS
ORAL_TABLET | Freq: Two times a day (BID) | ORAL | 1 refills | Status: DC
  Filled 2023-05-01: qty 180, 90d supply, fill #0
  Filled 2023-08-01: qty 180, 90d supply, fill #1

## 2023-05-01 NOTE — Telephone Encounter (Signed)
Prescription refill request for Eliquis received. Indication:AFIB Last office visit:10/24 Scr:1.09  9/24 Age: 72 Weight:79.3  kg  Prescription refilled

## 2023-05-02 ENCOUNTER — Other Ambulatory Visit (HOSPITAL_COMMUNITY): Payer: Self-pay

## 2023-05-03 ENCOUNTER — Ambulatory Visit: Payer: Medicare Other | Admitting: Cardiology

## 2023-05-04 ENCOUNTER — Encounter: Payer: Self-pay | Admitting: Family Medicine

## 2023-05-05 ENCOUNTER — Encounter: Payer: Self-pay | Admitting: Family Medicine

## 2023-05-05 ENCOUNTER — Other Ambulatory Visit: Payer: Medicare Other

## 2023-05-08 ENCOUNTER — Other Ambulatory Visit (INDEPENDENT_AMBULATORY_CARE_PROVIDER_SITE_OTHER): Payer: Medicare Other

## 2023-05-08 DIAGNOSIS — E039 Hypothyroidism, unspecified: Secondary | ICD-10-CM

## 2023-05-08 DIAGNOSIS — R61 Generalized hyperhidrosis: Secondary | ICD-10-CM

## 2023-05-09 LAB — COMPREHENSIVE METABOLIC PANEL
ALT: 20 U/L (ref 0–53)
AST: 26 U/L (ref 0–37)
Albumin: 4.2 g/dL (ref 3.5–5.2)
Alkaline Phosphatase: 65 U/L (ref 39–117)
BUN: 19 mg/dL (ref 6–23)
CO2: 31 meq/L (ref 19–32)
Calcium: 9.6 mg/dL (ref 8.4–10.5)
Chloride: 100 meq/L (ref 96–112)
Creatinine, Ser: 1 mg/dL (ref 0.40–1.50)
GFR: 74.23 mL/min (ref >60.00)
Glucose, Bld: 90 mg/dL (ref 70–99)
Potassium: 4.2 meq/L (ref 3.5–5.1)
Sodium: 138 meq/L (ref 135–145)
Total Bilirubin: 1.1 mg/dL (ref 0.2–1.2)
Total Protein: 7.4 g/dL (ref 6.0–8.3)

## 2023-05-09 LAB — CBC
HCT: 38.4 % — ABNORMAL LOW (ref 39.0–52.0)
Hemoglobin: 13.2 g/dL (ref 13.0–17.0)
MCHC: 34.2 g/dL (ref 30.0–36.0)
MCV: 94.4 fL (ref 78.0–100.0)
Platelets: 135 10*3/uL — ABNORMAL LOW (ref 150.0–400.0)
RBC: 4.07 Mil/uL — ABNORMAL LOW (ref 4.22–5.81)
RDW: 14.8 % (ref 11.5–15.5)
WBC: 5.3 10*3/uL (ref 4.0–10.5)

## 2023-05-09 LAB — TESTOSTERONE: Testosterone: 400.49 ng/dL (ref 300.00–890.00)

## 2023-05-09 LAB — T4, FREE: Free T4: 0.96 ng/dL (ref 0.60–1.60)

## 2023-05-09 LAB — TSH: TSH: 4.44 u[IU]/mL (ref 0.35–5.50)

## 2023-05-15 ENCOUNTER — Encounter: Payer: Self-pay | Admitting: Family Medicine

## 2023-05-15 ENCOUNTER — Ambulatory Visit (INDEPENDENT_AMBULATORY_CARE_PROVIDER_SITE_OTHER): Payer: Medicare Other | Admitting: Family Medicine

## 2023-05-15 VITALS — BP 130/78 | HR 68 | Temp 98.0°F | Resp 16 | Ht 71.0 in | Wt 182.0 lb

## 2023-05-15 DIAGNOSIS — F411 Generalized anxiety disorder: Secondary | ICD-10-CM | POA: Diagnosis not present

## 2023-05-15 MED ORDER — PAROXETINE HCL 30 MG PO TABS: 30.0000 mg | ORAL_TABLET | Freq: Every day | ORAL | 2 refills | Status: DC

## 2023-05-15 MED ORDER — EMPAGLIFLOZIN 10 MG PO TABS: 10.0000 mg | ORAL_TABLET | Freq: Every day | ORAL | 1 refills | Status: DC

## 2023-05-15 NOTE — Progress Notes (Signed)
Chief Complaint  Patient presents with   Follow-up    Follow up    Subjective: Patient is a 72 y.o. male here for follow-up.  He stopped taking his Jardiance and wants to know what it is for.  He had an echo showing reduced ejection fraction earlier this year.  He has an appointment with his cardiology team in a few weeks.  He feels good lately.  We tried to wean down on his Paxil.  He returned to his 30 mg dosage as he feels better on it.  Would like a refill.  No homicidal or suicidal ideation.  No self-medication.  He is not following with a therapist or counselor.  Past Medical History:  Diagnosis Date   Afib (HCC)    Anxiety and depression 03/25/2014   BMI 30.0-30.9,adult 05/15/2015   Carpal tunnel syndrome of right wrist 10/23/2015   Diverticulosis of colon without hemorrhage 03/17/2016   Essential hypertension, benign 03/25/2014   GERD (gastroesophageal reflux disease)    Hyperlipidemia    Hypothyroidism    Low testosterone 05/15/2015   Paroxysmal atrial fibrillation (HCC) 12/03/2018   Prostate cancer screening encounter, options and risks discussed 04/30/2014   Screening for ischemic heart disease 04/30/2014   Swelling of both lower extremities 11/29/2017   Visit for preventive health examination 04/30/2014    Objective: BP 130/78 (BP Location: Left Arm, Patient Position: Sitting, Cuff Size: Normal)   Pulse 68   Temp 98 F (36.7 C) (Oral)   Resp 16   Ht 5\' 11"  (1.803 m)   Wt 182 lb (82.6 kg)   SpO2 100%   BMI 25.38 kg/m  General: Awake, appears stated age Heart: RRR, no LE edema Lungs: CTAB, no rales, wheezes or rhonchi. No accessory muscle use Psych: Age appropriate judgment and insight, normal affect and mood  Assessment and Plan: GAD (generalized anxiety disorder)  Chronic, not fully stable we will go back to his Paxil 30 mg daily. I will refill his Jardiance until he gets in with his cardiology team. Follow-up in 6 months. The patient voiced  understanding and agreement to the plan.  Jilda Roche Bardolph, DO 05/15/23  5:08 PM

## 2023-05-15 NOTE — Patient Instructions (Addendum)
Keep the diet clean and stay active.  Let us know if you need anything. 

## 2023-05-29 NOTE — Progress Notes (Unsigned)
Cardiology Office Note:  .   Date:  05/31/2023  ID:  Darius Mitchell, DOB [birth year], MRN [id number] PCP: [name], DO  [facility] Providers Cardiologist:  Reatha Harps, MD { History of Present Illness: Darius Mitchell is a 72 y.o. male with history of CHF, CAD, persistent Afib, HLD who presents for follow-up.    History of Present Illness   Darius Mitchell, a 72 year old with a history of systolic heart failure, multivessel CAD managed medically, atrial fibrillation status post ablation, hypertension, and hyperlipidemia, presents for a follow-up visit after recent hospitalization. The patient's primary concern is an increase in blood pressure since hospital discharge, with readings reaching into the 150s. The patient's wife, who monitors the patient's blood pressure daily, corroborates this.   The patient also reports potential side effects from [place], a medication he started recently. He experiences sleeplessness and night sweats, which he suspects might be linked to the medication. The patient is also on losartan and was previously on amiodarone, which was discontinued. The patient is not currently experiencing any trouble breathing, chest pains, or swelling in the legs.  The patient's wife mentions that the patient's hands and feet are often cold, which the patient finds annoying. The patient also mentions that he has difficulty sleeping in bed but falls asleep easily when sitting in a chair on his porch.          Problem List Systolic HF -LVEF 35-40% 09/2022 2. CAD -70% mid LAD; 75% D1; 55% LCX; 85% RCA -> Med Management recommended  3. Persistent Afib -PVI 01/16/2023 -DCCV 02/10/2023 4. HLD -T chol 119, HDL 44, LDL 58, TG 83    ROS: All other ROS reviewed and negative. Pertinent positives noted in the HPI.     Studies Reviewed: Darius Mitchell Kitchen       Physical Exam:   VS:  BP 138/76 (BP Location: Left Arm, Patient Position: Sitting, Cuff Size: Normal)   Pulse (!)  57   Ht 5\' 11"  (1.803 m)   Wt 183 lb 3.2 oz (83.1 kg)   SpO2 100%   BMI 25.55 kg/m    Wt Readings from Last 3 Encounters:  05/31/23 183 lb 3.2 oz (83.1 kg)  05/15/23 182 lb (82.6 kg)  04/19/23 174 lb 12.8 oz (79.3 kg)    GEN: Well nourished, well developed in no acute distress NECK: No JVD; No carotid bruits CARDIAC: RRR, no murmurs, rubs, gallops RESPIRATORY:  Clear to auscultation without rales, wheezing or rhonchi  ABDOMEN: Soft, non-tender, non-distended EXTREMITIES:  No edema; No deformity  ASSESSMENT AND PLAN: .   Assessment and Plan    Systolic Heart Failure, EF 35-40% Ischemic Etiology  Improved blood pressure since hospitalization. Currently on Jardiance and Losartan. Patient reports possible sleep disturbances with Jardiance. -- -Bradycardia precludes use of beta blockers. -Hold Jardiance for 7 days to assess for improvement in sleep disturbances. If no improvement, continue jardiance.  -Discontinue Losartan. Start Entresto 24/26mg  BID. Start Aldactone 25mg  daily. -Plan for pharmacy visit in 2-3 weeks for Entresto titration. -Recheck echocardiogram in 3-6 months.  Atrial Fibrillation Status post pulmonary vein isolation, maintaining sinus rhythm. Currently on Eliquis 5mg  BID. -Continue Eliquis 5mg  BID.  Coronary Artery Disease Multivessel CAD managed medically. No symptoms of angina. Currently on Lipitor 80mg . Recently stopped Repatha. -Restart Repatha. -Continue Lipitor 80mg .  Follow-up in 3 months.              Follow-up: No follow-ups on file.  Time Spent  with Patient: I have spent a total of 35 minutes caring for this patient today face to face, ordering and reviewing labs/tests, reviewing prior records/medical history, examining the patient, establishing an assessment and plan, communicating results/findings to the patient/family, and documenting in the medical record.   Signed, Lenna Gilford. Flora Lipps, MD, [facility]  [facility]  [address] [place], [place] [address] [phone]  11:48 AM

## 2023-05-31 ENCOUNTER — Encounter: Payer: Self-pay | Admitting: Cardiovascular Disease

## 2023-05-31 ENCOUNTER — Ambulatory Visit: Payer: Medicare Other | Attending: Cardiovascular Disease | Admitting: Cardiovascular Disease

## 2023-05-31 VITALS — BP 138/76 | HR 57 | Ht 71.0 in | Wt 183.2 lb

## 2023-05-31 DIAGNOSIS — E782 Mixed hyperlipidemia: Secondary | ICD-10-CM | POA: Diagnosis not present

## 2023-05-31 DIAGNOSIS — I5022 Chronic systolic (congestive) heart failure: Secondary | ICD-10-CM | POA: Diagnosis not present

## 2023-05-31 DIAGNOSIS — I2510 Atherosclerotic heart disease of native coronary artery without angina pectoris: Secondary | ICD-10-CM | POA: Diagnosis not present

## 2023-05-31 DIAGNOSIS — I4819 Other persistent atrial fibrillation: Secondary | ICD-10-CM | POA: Diagnosis not present

## 2023-05-31 MED ORDER — ENTRESTO 24-26 MG PO TABS: 1.0000 | ORAL_TABLET | Freq: Two times a day (BID) | ORAL | Status: DC

## 2023-05-31 MED ORDER — SPIRONOLACTONE 25 MG PO TABS: 25.0000 mg | ORAL_TABLET | Freq: Every day | ORAL | 3 refills | Status: DC

## 2023-05-31 MED ORDER — REPATHA SURECLICK 140 MG/ML ~~LOC~~ SOAJ: 140.0000 mg | SUBCUTANEOUS | 2 refills | Status: DC

## 2023-05-31 MED ORDER — ENTRESTO 24-26 MG PO TABS: 1.0000 | ORAL_TABLET | Freq: Two times a day (BID) | ORAL | 1 refills | Status: DC

## 2023-05-31 NOTE — Patient Instructions (Signed)
Medication Instructions:  Start aldactone 25mg  daily     *If you need a refill on your cardiac medications before your next appointment, please call your pharmacy*   Lab Work: None    If you have labs (blood work) drawn today and your tests are completely normal, you will receive your results only by: MyChart Message (if you have MyChart) OR A paper copy in the mail If you have any lab test that is abnormal or we need to change your treatment, we will call you to review the results.   Testing/Procedures: None    Follow-Up: At [facility], you and your health needs are our priority.  As part of our continuing mission to provide you with exceptional heart care, we have created designated Provider Care Teams.  These Care Teams include your primary Cardiologist (physician) and Advanced Practice Providers (APPs -  Physician Assistants and Nurse Practitioners) who all work together to provide you with the care you need, when you need it.  We recommend signing up for the patient portal called "MyChart".  Sign up information is provided on this After Visit Summary.  MyChart is used to connect with patients for Virtual Visits (Telemedicine).  Patients are able to view lab/test results, encounter notes, upcoming appointments, etc.  Non-urgent messages can be sent to your provider as well.   To learn more about what you can do with MyChart, go to [email or web address].    Your next appointment:   3 month(s)  The format for your next appointment:   In Person  Provider:   Reatha Harps, MD    Other Instructions Dr. Flora Lipps has referred you to pharmacist for follow up appointment in 2-3wks

## 2023-06-04 ENCOUNTER — Encounter: Payer: Self-pay | Admitting: Family Medicine

## 2023-07-10 ENCOUNTER — Encounter: Payer: Self-pay | Admitting: Cardiovascular Disease

## 2023-07-11 ENCOUNTER — Ambulatory Visit: Payer: Medicare Other

## 2023-08-01 ENCOUNTER — Other Ambulatory Visit (HOSPITAL_BASED_OUTPATIENT_CLINIC_OR_DEPARTMENT_OTHER): Payer: Self-pay

## 2023-08-02 ENCOUNTER — Other Ambulatory Visit: Payer: Self-pay | Admitting: Cardiovascular Disease

## 2023-08-02 ENCOUNTER — Other Ambulatory Visit: Payer: Self-pay | Admitting: Cardiology

## 2023-08-02 ENCOUNTER — Other Ambulatory Visit: Payer: Self-pay | Admitting: Family Medicine

## 2023-08-02 ENCOUNTER — Encounter: Payer: Self-pay | Admitting: Cardiovascular Disease

## 2023-08-02 ENCOUNTER — Other Ambulatory Visit (HOSPITAL_BASED_OUTPATIENT_CLINIC_OR_DEPARTMENT_OTHER): Payer: Self-pay

## 2023-08-02 MED ORDER — ENTRESTO 24-26 MG PO TABS: 1.0000 | ORAL_TABLET | Freq: Two times a day (BID) | ORAL | 2 refills | Status: DC

## 2023-08-03 ENCOUNTER — Other Ambulatory Visit: Payer: Self-pay

## 2023-08-03 ENCOUNTER — Other Ambulatory Visit (HOSPITAL_BASED_OUTPATIENT_CLINIC_OR_DEPARTMENT_OTHER): Payer: Self-pay

## 2023-08-03 ENCOUNTER — Other Ambulatory Visit (HOSPITAL_COMMUNITY): Payer: Self-pay

## 2023-08-03 MED FILL — Sacubitril-Valsartan Tab 24-26 MG: ORAL | 30 days supply | Qty: 60 | Fill #0 | Status: CN

## 2023-08-06 ENCOUNTER — Ambulatory Visit
Admission: EM | Admit: 2023-08-06 | Discharge: 2023-08-06 | Disposition: A | Discharge status: Home / Self Care | Payer: Medicare Other | Attending: Family Medicine | Admitting: Family Medicine

## 2023-08-06 ENCOUNTER — Encounter: Payer: Self-pay | Admitting: Emergency Medicine

## 2023-08-06 ENCOUNTER — Ambulatory Visit (INDEPENDENT_AMBULATORY_CARE_PROVIDER_SITE_OTHER): Payer: Medicare Other

## 2023-08-06 DIAGNOSIS — S6991XA Unspecified injury of right wrist, hand and finger(s), initial encounter: Secondary | ICD-10-CM

## 2023-08-06 DIAGNOSIS — M79644 Pain in right finger(s): Secondary | ICD-10-CM | POA: Diagnosis not present

## 2023-08-06 DIAGNOSIS — M19041 Primary osteoarthritis, right hand: Secondary | ICD-10-CM | POA: Diagnosis not present

## 2023-08-06 HISTORY — DX: Heart failure, unspecified: I50.9

## 2023-08-06 MED ORDER — HYDROCODONE-ACETAMINOPHEN 5-325 MG PO TABS: 1.0000 | ORAL_TABLET | Freq: Four times a day (QID) | ORAL | 0 refills | Status: DC | PRN

## 2023-08-06 NOTE — ED Provider Notes (Signed)
 [facility]    CSN: [id number] Arrival date & time: 08/06/23  1040      History   Chief Complaint Chief Complaint  Patient presents with   Finger Injury    HPI Darius Mitchell is a 73 y.o. male presents for a finger injury.  Patient reports yesterday he was doing some work in his yard with some landscaping stones when he caught his right index finger between 2 stones.  States since then he has had some swelling of the entire finger as well as some throbbing.  No numbness or tingling.  He does take Eliquis  for A-fib.  No history of injuries or surgeries to the finger in the past.  He has been icing it.  No other concerns at this time.  HPI  Past Medical History:  Diagnosis Date   Afib (HCC)    Anxiety and depression 03/25/2014   BMI 30.0-30.9,adult 05/15/2015   Carpal tunnel syndrome of right wrist 10/23/2015   CHF (congestive heart failure) (HCC)    Diverticulosis of colon without hemorrhage 03/17/2016   Essential hypertension, benign 03/25/2014   GERD (gastroesophageal reflux disease)    Hyperlipidemia    Hypothyroidism    Low testosterone  05/15/2015   Paroxysmal atrial fibrillation (HCC) 12/03/2018   Prostate cancer screening encounter, options and risks discussed 04/30/2014   Screening for ischemic heart disease 04/30/2014   Swelling of both lower extremities 11/29/2017   Visit for preventive health examination 04/30/2014    Patient Active Problem List   Diagnosis Date Noted   Atrial fibrillation with RVR (HCC) 02/11/2023   Chronic systolic heart failure (HCC) 02/11/2023   CAD (coronary artery disease), native coronary artery 02/11/2023   Elevated coronary artery calcium  score more than 8000! 01/20/2023   Hypercoagulable state due to persistent atrial fibrillation (HCC) 12/01/2022   HFrEF (heart failure with reduced ejection fraction) (HCC) 11/21/2022   Atrial flutter (HCC) 11/21/2022   Persistent atrial fibrillation (HCC) 10/31/2022   Dilated  cardiomyopathy (HCC) 09/22/2022   Actinic keratosis 08/03/2022   Neoplasm of uncertain behavior of skin 08/03/2022   Sun-damaged skin 08/03/2022   Cervical strain 06/14/2021   Paroxysmal atrial fibrillation (HCC) 12/03/2018   Swelling of both lower extremities 11/29/2017   Diverticulosis of colon without hemorrhage 03/17/2016   Carpal tunnel syndrome of right wrist 10/23/2015   Low testosterone  05/15/2015   BMI 30.0-30.9,adult 05/15/2015   Screening for ischemic heart disease 04/30/2014   Visit for preventive health examination 04/30/2014   Prostate cancer screening encounter, options and risks discussed 04/30/2014   Essential hypertension, benign 03/25/2014   Hyperlipidemia 03/25/2014   Hypothyroidism 03/25/2014    Past Surgical History:  Procedure Laterality Date   APPENDECTOMY  1971   ATRIAL FIBRILLATION ABLATION N/A 01/16/2023   Procedure: ATRIAL FIBRILLATION ABLATION;  Surgeon: Inocencio Soyla Lunger, MD;  Location: [facility] INVASIVE CV LAB;  Service: Cardiovascular;  Laterality: N/A;   CARDIOVERSION N/A 11/02/2022   Procedure: CARDIOVERSION;  Surgeon: Sheena Pugh, DO;  Location: [facility] INVASIVE CV LAB;  Service: Cardiovascular;  Laterality: N/A;   CARDIOVERSION N/A 11/24/2022   Procedure: CARDIOVERSION;  Surgeon: Jeffrie Oneil BROCKS, MD;  Location: [facility] INVASIVE CV LAB;  Service: Cardiovascular;  Laterality: N/A;   CARDIOVERSION N/A 02/10/2023   Procedure: CARDIOVERSION;  Surgeon: Barbaraann Darryle Ned, MD;  Location: [facility] INVASIVE CV LAB;  Service: Cardiovascular;  Laterality: N/A;   LEFT HEART CATH AND CORONARY ANGIOGRAPHY N/A 02/09/2023   Procedure: LEFT HEART CATH AND CORONARY ANGIOGRAPHY;  Surgeon: Mady Bruckner, MD;  Location: [facility] INVASIVE CV LAB;  Service: Cardiovascular;  Laterality: N/A;   WISDOM TOOTH EXTRACTION  1972       Home Medications    Prior to Admission medications   Medication Sig Start Date End Date Taking? Authorizing Provider  HYDROcodone -acetaminophen  (NORCO/VICODIN)  5-325 MG tablet Take 1 tablet by mouth every 6 (six) hours as needed for severe pain (pain score 7-10). 08/06/23  Yes Mosie Angus, Jodi R, NP  losartan  (COZAAR ) 100 MG tablet Take 100 mg by mouth daily. 08/02/23  Yes [provider]  apixaban  (ELIQUIS ) 5 MG TABS tablet Take 1 tablet by mouth 2 (two) times daily. 05/01/23 04/30/24  Krasowski, Robert J, MD  atorvastatin  (LIPITOR ) 80 MG tablet TAKE 1 TABLET BY MOUTH EVERY DAY 08/03/23   Frann Mabel Mt, DO  Boswellia-Glucosamine-Vit D (OSTEO BI-FLEX ONE PER DAY PO) Take 1 tablet by mouth daily.    [provider]  empagliflozin  (JARDIANCE ) 10 MG TABS tablet Take 1 tablet (10 mg total) by mouth daily. 05/15/23   Frann Mabel Mt, DO  Evolocumab  (REPATHA  SURECLICK) 140 MG/ML SOAJ Inject 140 mg into the skin every 14 (fourteen) days. 05/31/23   Barbaraann Darryle Ned, MD  levothyroxine  (SYNTHROID ) 100 MCG tablet TAKE 1 TABLET BY MOUTH EVERY DAY 03/03/23   Wendling, Mabel Mt, DO  Misc Natural Products (NEURIVA) CAPS Take 1 capsule by mouth daily.    [provider]  Multiple Vitamins-Minerals (CENTRUM SILVER  ULTRA MENS) TABS Take 1 tablet by mouth in the morning.    [provider]  Omega-3 Fatty Acids (OMEGA 3 PO) Take 1 capsule by mouth daily.    [provider]  omeprazole  (PRILOSEC) 20 MG capsule Take 1 capsule (20 mg total) by mouth daily. 03/31/23   Wyn Jackee VEAR Mickey., NP  PARoxetine  (PAXIL ) 30 MG tablet Take 1 tablet (30 mg total) by mouth daily. 05/15/23   Frann Mabel Mt, DO  sacubitril -valsartan  (ENTRESTO ) 24-26 MG Take 1 tablet by mouth 2 (two) times daily. 05/31/23   O'NealDarryle Ned, MD  sacubitril -valsartan  (ENTRESTO ) 24-26 MG Take 1 tablet by mouth 2 (two) times daily. 08/03/23   O'NealDarryle Ned, MD  sacubitril -valsartan  (ENTRESTO ) 24-26 MG Take 1 tablet by mouth 2 (two) times daily. 08/02/23   O'NealDarryle Ned, MD  spironolactone  (ALDACTONE ) 25 MG tablet Take 1 tablet (25 mg  total) by mouth daily. 05/31/23   O'NealDarryle Ned, MD  Ubiquinol 200 MG CAPS Take 1 capsule by mouth daily.    [provider]  Vitamin D-Vitamin K (VITAMIN K2-VITAMIN D3 PO) Take 1 tablet by mouth daily.    [provider]    Family History Family History  Problem Relation Age of Onset   Stroke Mother 10       Deceased   Heart attack Mother    Lymphoma Father 24       Deceased   Heart disease Father    Stroke Maternal Grandmother    Stomach cancer Paternal Grandfather    Healthy Brother        x2   Healthy Sister    Hypertension Daughter    Healthy Daughter     Social History Social History   Tobacco Use   Smoking status: Former    Current packs/day: 0.00    Average packs/day: 0.3 packs/day for 40.0 years (10.0 ttl pk-yrs)    Types: Cigarettes    Start date: 05/28/1979    Quit date: 05/28/2019    Years since quitting: 4.1  Smokeless tobacco: Never  Vaping Use   Vaping status: Never Used  Substance Use Topics   Alcohol use: Never   Drug use: Never     Allergies   Codeine and Niacin   Review of Systems Review of Systems  Musculoskeletal:        Right index finger injury     Physical Exam Triage Vital Signs ED Triage Vitals  Encounter Vitals Group     BP 08/06/23 1120 133/73     Systolic BP Percentile --      Diastolic BP Percentile --      Pulse Rate 08/06/23 1120 (!) 53     Resp 08/06/23 1120 16     Temp 08/06/23 1120 (!) 97.5 F (36.4 C)     Temp Source 08/06/23 1120 Oral     SpO2 08/06/23 1120 97 %     Weight --      Height --      Head Circumference --      Peak Flow --      Pain Score 08/06/23 1119 5     Pain Loc --      Pain Education --      Exclude from Growth Chart --    No data found.  Updated Vital Signs BP 133/73 (BP Location: Left Arm)   Pulse (!) 53   Temp (!) 97.5 F (36.4 C) (Oral)   Resp 16   SpO2 97%   Visual Acuity Right Eye Distance:   Left Eye Distance:   Bilateral Distance:    Right  Eye Near:   Left Eye Near:    Bilateral Near:     Physical Exam Vitals and nursing note reviewed.  Constitutional:      General: He is not in acute distress.    Appearance: Normal appearance. He is not ill-appearing.  HENT:     Head: Normocephalic and atraumatic.  Eyes:     Pupils: Pupils are equal, round, and reactive to light.  Cardiovascular:     Rate and Rhythm: Bradycardia present.     Comments: Heart rate 53  Pulmonary:     Effort: Pulmonary effort is normal.  Musculoskeletal:     Comments: There is mild swelling of the distal index finger that extends to the proximal finger/MCP joint.  Tender to palpation primarily over the DIP joint and the distal finger.  Small subungual noted.  Nail is intact and not lifted or cracked.  Skin:    General: Skin is warm and dry.  Neurological:     General: No focal deficit present.     Mental Status: He is alert and oriented to person, place, and time.  Psychiatric:        Mood and Affect: Mood normal.        Behavior: Behavior normal.      UC Treatments / Results  Labs (all labs ordered are listed, but only abnormal results are displayed) Labs Reviewed - No data to display  EKG   Radiology No results found.  Procedures Procedures (including critical care time)  Medications Ordered in UC Medications - No data to display  Initial Impression / Assessment and Plan / UC Course  I have reviewed the triage vital signs and the nursing notes.  Pertinent labs & imaging results that were available during my care of the patient were reviewed by me and considered in my medical decision making (see chart for details).     Reviewed exam and symptoms with patient.  No red flags.  X-ray negative for fracture.  Patient with very small subungual and is also on blood thinning medications.  Do not feel trephination is indicated at this time.  Will place in finger splint for support and advised RICE therapy.  Will do Rx Norco as needed for  pain.  Patient should not take NSAIDs as he is on Eliquis .  Advised PCP follow-up 2 to 3 days for recheck.  ER precautions reviewed and patient verbalized understanding. Final Clinical Impressions(s) / UC Diagnoses   Final diagnoses:  Pain of finger of right hand  Injury of right index finger, initial encounter     Discharge Instructions      You may use a finger splint to help protect your index finger.  Continue to elevate and ice as you need to.  You may take Norco every 6 hours as needed for your pain.  Please note this medication contains Tylenol .  Do not take any additional Tylenol  over-the-counter while you are taking this medication.  Follow-up with your PCP in 2 to 3 days for recheck.  Please go to the ER if you develop any worsening symptoms.  I hope you feel better soon!     ED Prescriptions     Medication Sig Dispense Auth. Provider   HYDROcodone -acetaminophen  (NORCO/VICODIN) 5-325 MG tablet Take 1 tablet by mouth every 6 (six) hours as needed for severe pain (pain score 7-10). 8 tablet Prisilla Kocsis, Jodi R, NP      I have reviewed the PDMP during this encounter.   Loreda Myla SAUNDERS, NP 08/06/23 1226

## 2023-08-06 NOTE — Discharge Instructions (Addendum)
 You may use a finger splint to help protect your index finger.  Continue to elevate and ice as you need to.  You may take Norco every 6 hours as needed for your pain.  Please note this medication contains Tylenol .  Do not take any additional Tylenol  over-the-counter while you are taking this medication.  Follow-up with your PCP in 2 to 3 days for recheck.  Please go to the ER if you develop any worsening symptoms.  I hope you feel better soon!

## 2023-08-06 NOTE — ED Triage Notes (Signed)
 Pt was putting down gardening stones yesterday and smashed right index finger in between two stones. Pt c/o pain in finger all night. Has bruising under nail. Taken Ibuprofen earlier today.

## 2023-08-08 ENCOUNTER — Other Ambulatory Visit: Payer: Self-pay | Admitting: Family Medicine

## 2023-08-14 ENCOUNTER — Ambulatory Visit: Payer: Medicare Other | Attending: Cardiovascular Disease | Admitting: Pharmacist

## 2023-08-14 ENCOUNTER — Encounter: Payer: Self-pay | Admitting: Pharmacist

## 2023-08-14 VITALS — BP 106/62 | HR 61 | Wt 185.6 lb

## 2023-08-14 DIAGNOSIS — I420 Dilated cardiomyopathy: Secondary | ICD-10-CM | POA: Diagnosis not present

## 2023-08-14 DIAGNOSIS — I5022 Chronic systolic (congestive) heart failure: Secondary | ICD-10-CM | POA: Diagnosis not present

## 2023-08-14 NOTE — Progress Notes (Signed)
Patient ID: Darius Mitchell                 DOB: [birth year]                      MRN: [id number]     HPI: Darius Mitchell is a 73 y.o. male referred by Dr. Flora Lipps to pharmacy clinic for HF medication management. PMH is significant for A Fib, HTN, HFrEF, CAD, elevated coronary calcium score, and HLD. Most recent LVEF 35-40% on 09/29/22.  Patient presents today with wife.  Reports he feels great. Denies CP, SOB, or LEE. Wife takes his blood pressure and pulse daily and reports readings always <120/80 and pulse between 55-65 bpm. She did not bring her home log. Denies dizziness, lightheadedness, or other hypotensive symptoms.  Reports having night sweats now which is a new symptom. Reports urine is dark yellow but also admits he does not drink enough water. Wife is trying to make him stay better hydrated.  Has many questions whether he needs to stay on atorvastatin and Repatha. Needs updated labs.  Current CHF meds:  Jardiance 10mg  daily Entresto 24-26mg  BID Spironolactone 25mg  daily  BP goal: <130/80   Wt Readings from Last 3 Encounters:  05/31/23 183 lb 3.2 oz (83.1 kg)  05/15/23 182 lb (82.6 kg)  04/19/23 174 lb 12.8 oz (79.3 kg)   BP Readings from Last 3 Encounters:  08/06/23 133/73  05/31/23 138/76  05/15/23 130/78   Pulse Readings from Last 3 Encounters:  08/06/23 (!) 53  05/31/23 (!) 57  05/15/23 68    Renal function: CrCl cannot be calculated (Patient's most recent lab result is older than the maximum 21 days allowed.).  Past Medical History:  Diagnosis Date   Afib (HCC)    Anxiety and depression 03/25/2014   BMI 30.0-30.9,adult 05/15/2015   Carpal tunnel syndrome of right wrist 10/23/2015   CHF (congestive heart failure) (HCC)    Diverticulosis of colon without hemorrhage 03/17/2016   Essential hypertension, benign 03/25/2014   GERD (gastroesophageal reflux disease)    Hyperlipidemia    Hypothyroidism    Low testosterone 05/15/2015   Paroxysmal atrial  fibrillation (HCC) 12/03/2018   Prostate cancer screening encounter, options and risks discussed 04/30/2014   Screening for ischemic heart disease 04/30/2014   Swelling of both lower extremities 11/29/2017   Visit for preventive health examination 04/30/2014    Current Outpatient Medications on File Prior to Visit  Medication Sig Dispense Refill   apixaban (ELIQUIS) 5 MG TABS tablet Take 1 tablet by mouth 2 (two) times daily. 180 tablet 1   atorvastatin (LIPITOR) 80 MG tablet TAKE 1 TABLET BY MOUTH EVERY DAY 90 tablet 3   Boswellia-Glucosamine-Vit D (OSTEO BI-FLEX ONE PER DAY PO) Take 1 tablet by mouth daily.     Evolocumab (REPATHA SURECLICK) 140 MG/ML SOAJ Inject 140 mg into the skin every 14 (fourteen) days. 2 mL 2   HYDROcodone-acetaminophen (NORCO/VICODIN) 5-325 MG tablet Take 1 tablet by mouth every 6 (six) hours as needed for severe pain (pain score 7-10). 8 tablet 0   JARDIANCE 10 MG TABS tablet TAKE 1 TABLET BY MOUTH EVERY DAY 30 tablet 1   levothyroxine (SYNTHROID) 100 MCG tablet TAKE 1 TABLET BY MOUTH EVERY DAY 90 tablet 1   losartan (COZAAR) 100 MG tablet Take 100 mg by mouth daily.     Misc Natural Products (NEURIVA) CAPS Take 1 capsule by mouth daily.     Multiple Vitamins-Minerals (CENTRUM  SILVER ULTRA MENS) TABS Take 1 tablet by mouth in the morning.     Omega-3 Fatty Acids (OMEGA 3 PO) Take 1 capsule by mouth daily.     omeprazole (PRILOSEC) 20 MG capsule Take 1 capsule (20 mg total) by mouth daily. 90 capsule 1   PARoxetine (PAXIL) 30 MG tablet Take 1 tablet (30 mg total) by mouth daily. 90 tablet 2   sacubitril-valsartan (ENTRESTO) 24-26 MG Take 1 tablet by mouth 2 (two) times daily.     sacubitril-valsartan (ENTRESTO) 24-26 MG Take 1 tablet by mouth 2 (two) times daily. 60 tablet 6   sacubitril-valsartan (ENTRESTO) 24-26 MG Take 1 tablet by mouth 2 (two) times daily. 180 tablet 2   spironolactone (ALDACTONE) 25 MG tablet Take 1 tablet (25 mg total) by mouth daily. 90  tablet 3   Ubiquinol 200 MG CAPS Take 1 capsule by mouth daily.     Vitamin D-Vitamin K (VITAMIN K2-VITAMIN D3 PO) Take 1 tablet by mouth daily.     No current facility-administered medications on file prior to visit.    Allergies  Allergen Reactions   Codeine Nausea Only    GI Intolerance   Niacin Other (See Comments)    Flushing     Assessment/Plan:  1. CHF -  Patient BP in room 106/62 which is at goal of <130/80 and patient feels well. No medication adjustments needed at this time. Continue to monitor BP/pulse at home.  Will place orders to update fasting lipid panel plus BMP since starting spironolactone to assess renal function and electrolytes.   Continue: Jardiance 10mg  daily Entresto 24-26mg  BID Spironolactone 25mg  daily Recheck BMP/lipids F/u as needed  Laural Golden, PharmD, BCACP, CDCES, CPP [address], [address] [place], [place], [address] Phone: [phone], Fax: [phone]

## 2023-08-14 NOTE — Patient Instructions (Addendum)
It was nice meeting you two today  We would like your blood pressure to stay less than 130/80  Please continue your:  Entresto 24-26mg  twice a day Spironolactone 25mg  daily Jardiance 10mg  daily  I have placed a lab order to check your lipid panel and kidney function  Continue to monitor your blood pressure and pulse at home and let us know if there are any changes   Laural Golden, PharmD, BCACP, CDCES, CPP [address] [name], [place], [address] Phone: [phone], Fax: [phone]

## 2023-08-15 ENCOUNTER — Other Ambulatory Visit (HOSPITAL_BASED_OUTPATIENT_CLINIC_OR_DEPARTMENT_OTHER): Payer: Self-pay

## 2023-08-17 ENCOUNTER — Other Ambulatory Visit (HOSPITAL_BASED_OUTPATIENT_CLINIC_OR_DEPARTMENT_OTHER): Payer: Self-pay

## 2023-08-17 MED FILL — Sacubitril-Valsartan Tab 24-26 MG: ORAL | 30 days supply | Qty: 60 | Fill #0 | Status: AC

## 2023-08-21 DIAGNOSIS — L570 Actinic keratosis: Secondary | ICD-10-CM | POA: Diagnosis not present

## 2023-08-23 DIAGNOSIS — I420 Dilated cardiomyopathy: Secondary | ICD-10-CM | POA: Diagnosis not present

## 2023-08-23 DIAGNOSIS — I5022 Chronic systolic (congestive) heart failure: Secondary | ICD-10-CM | POA: Diagnosis not present

## 2023-08-24 LAB — BASIC METABOLIC PANEL
BUN/Creatinine Ratio: 24 (ref 10–24)
BUN: 27 mg/dL (ref 8–27)
CO2: 23 mmol/L (ref 20–29)
Calcium: 9.5 mg/dL (ref 8.6–10.2)
Chloride: 102 mmol/L (ref 96–106)
Creatinine, Ser: 1.13 mg/dL (ref 0.76–1.27)
Glucose: 82 mg/dL (ref 70–99)
Potassium: 5 mmol/L (ref 3.5–5.2)
Sodium: 139 mmol/L (ref 134–144)
eGFR: 68 mL/min/{1.73_m2} (ref >59)

## 2023-08-24 LAB — LIPID PANEL
Chol/HDL Ratio: 1.9 {ratio} (ref 0.0–5.0)
Cholesterol, Total: 96 mg/dL — ABNORMAL LOW (ref 100–199)
HDL: 50 mg/dL (ref >39)
LDL Chol Calc (NIH): 31 mg/dL (ref 0–99)
Triglycerides: 70 mg/dL (ref 0–149)
VLDL Cholesterol Cal: 15 mg/dL (ref 5–40)

## 2023-08-27 ENCOUNTER — Encounter: Payer: Self-pay | Admitting: Cardiovascular Disease

## 2023-09-05 NOTE — Progress Notes (Deleted)
 Cardiology Office Note:  .   Date:  09/05/2023  ID:  [name], DOB Jul 27, 1950, MRN [id number] PCP: [name], DO  [facility] Providers Cardiologist:  Reatha Harps, MD {}   History of Present Illness: .   No chief complaint on file.   Darius Mitchell is a 73 y.o. male with history of CHF, CAD, persistent Afib, HLD who presents for follow-up.      Problem List Systolic HF -LVEF 35-40% 09/2022 -no BB due to bradycardia 2. CAD -70% mid LAD; 75% D1; 55% LCX; 85% RCA -> Med Management recommended  3. Persistent Afib -PVI 01/16/2023 -DCCV 02/10/2023 4. HLD -T chol 96, HDL 50, LDL 31, TG 70 5. Bradycardia     ROS: All other ROS reviewed and negative. Pertinent positives noted in the HPI.     Studies Reviewed: Marland Kitchen        TTE 09/29/2022  1. Atrial fibrillation. Left ventricular ejection fraction, by  estimation, is 35 to 40%. Left ventricular ejection fraction by 2D MOD  biplane is 40.6 %. The left ventricle has moderately decreased function.  The left ventricle demonstrates global  hypokinesis. There is mild concentric left ventricular hypertrophy. Left  ventricular diastolic parameters are indeterminate.   2. Right ventricular systolic function is moderately reduced. The right  ventricular size is normal. There is mildly elevated pulmonary artery  systolic pressure.   3. Left atrial size was severely dilated.   4. The mitral valve is degenerative. Moderate mitral valve regurgitation.  No evidence of mitral stenosis.   5. Tricuspid valve regurgitation is mild to moderate.   6. The aortic valve is tricuspid. Aortic valve regurgitation is mild.  Aortic valve sclerosis is present, with no evidence of aortic valve  stenosis.   7. Aortic Normal DTA. There is borderline dilatation of the ascending  aorta, measuring 38 mm.   8. The inferior vena cava is normal in size with greater than 50%  respiratory variability, suggesting right atrial pressure of 3  mmHg.   LHC 02/09/2023 Conclusions: Heavily calcified coronary arteries with severe disease involving the proximal/mid LAD and large D1 branch as well as multiple segments of the RCA, as detailed below.  There is also moderate mid LCx disease of up to 50%. Normal left ventricular filling pressure. Physical Exam:   VS:  There were no vitals taken for this visit.   Wt Readings from Last 3 Encounters:  08/14/23 185 lb 9.6 oz (84.2 kg)  05/31/23 183 lb 3.2 oz (83.1 kg)  05/15/23 182 lb (82.6 kg)    GEN: Well nourished, well developed in no acute distress NECK: No JVD; No carotid bruits CARDIAC: ***RRR, no murmurs, rubs, gallops RESPIRATORY:  Clear to auscultation without rales, wheezing or rhonchi  ABDOMEN: Soft, non-tender, non-distended EXTREMITIES:  No edema; No deformity  ASSESSMENT AND PLAN: .   ***    {Are you ordering a CV Procedure (e.g. stress test, cath, DCCV, TEE, etc)?   Press F2        :[id number]}   Follow-up: No follow-ups on file.  Time Spent with Patient: I have spent a total of *** minutes caring for this patient today face to face, ordering and reviewing labs/tests, reviewing prior records/medical history, examining the patient, establishing an assessment and plan, communicating results/findings to the patient/family, and documenting in the medical record.   Signed, Lenna Gilford. Flora Lipps, MD, [facility]  [facility]  [address] [place], [place] [address] [phone]  [phone]  7:58 PM

## 2023-09-07 ENCOUNTER — Ambulatory Visit: Payer: Medicare Other | Admitting: Cardiovascular Disease

## 2023-09-07 DIAGNOSIS — I5022 Chronic systolic (congestive) heart failure: Secondary | ICD-10-CM

## 2023-09-07 DIAGNOSIS — I420 Dilated cardiomyopathy: Secondary | ICD-10-CM

## 2023-09-07 DIAGNOSIS — E782 Mixed hyperlipidemia: Secondary | ICD-10-CM

## 2023-09-07 DIAGNOSIS — I4819 Other persistent atrial fibrillation: Secondary | ICD-10-CM

## 2023-09-07 DIAGNOSIS — I2510 Atherosclerotic heart disease of native coronary artery without angina pectoris: Secondary | ICD-10-CM

## 2023-09-11 ENCOUNTER — Other Ambulatory Visit (HOSPITAL_BASED_OUTPATIENT_CLINIC_OR_DEPARTMENT_OTHER): Payer: Self-pay

## 2023-09-11 MED FILL — Sacubitril-Valsartan Tab 24-26 MG: ORAL | 30 days supply | Qty: 60 | Fill #1 | Status: CN

## 2023-09-15 ENCOUNTER — Other Ambulatory Visit (HOSPITAL_BASED_OUTPATIENT_CLINIC_OR_DEPARTMENT_OTHER): Payer: Self-pay

## 2023-09-15 MED FILL — Sacubitril-Valsartan Tab 24-26 MG: ORAL | 30 days supply | Qty: 60 | Fill #1 | Status: CN

## 2023-09-15 MED FILL — Sacubitril-Valsartan Tab 24-26 MG: ORAL | 30 days supply | Qty: 60 | Fill #1 | Status: AC

## 2023-09-20 ENCOUNTER — Ambulatory Visit: Attending: Physician Assistant | Admitting: Physician Assistant

## 2023-09-20 VITALS — BP 112/64 | HR 60 | Ht 71.0 in | Wt 192.0 lb

## 2023-09-20 DIAGNOSIS — I2510 Atherosclerotic heart disease of native coronary artery without angina pectoris: Secondary | ICD-10-CM | POA: Diagnosis not present

## 2023-09-20 DIAGNOSIS — I5022 Chronic systolic (congestive) heart failure: Secondary | ICD-10-CM

## 2023-09-20 DIAGNOSIS — I4819 Other persistent atrial fibrillation: Secondary | ICD-10-CM | POA: Diagnosis not present

## 2023-09-20 DIAGNOSIS — I10 Essential (primary) hypertension: Secondary | ICD-10-CM

## 2023-09-20 DIAGNOSIS — E782 Mixed hyperlipidemia: Secondary | ICD-10-CM | POA: Diagnosis not present

## 2023-09-20 NOTE — Patient Instructions (Addendum)
 Medication Instructions:  RESTART JARDIANCE 10 MG DAILY  *If you need a refill on your cardiac medications before your next appointment, please call your pharmacy*   Lab Work: FASTING LIPID IN AUGUST 2025 If you have labs (blood work) drawn today and your tests are completely normal, you will receive your results only by: MyChart Message (if you have MyChart) OR A paper copy in the mail If you have any lab test that is abnormal or we need to change your treatment, we will call you to review the results.   Testing/Procedures:IN LATE JUNE 2025 Your physician has requested that you have an echocardiogram. Echocardiography is a painless test that uses sound waves to create images of your heart. It provides your doctor with information about the size and shape of your heart and how well your heart's chambers and valves are working. This procedure takes approximately one hour. There are no restrictions for this procedure. Please do NOT wear cologne, perfume, aftershave, or lotions (deodorant is allowed). Please arrive 15 minutes prior to your appointment time.  Please note: We ask at that you not bring children with you during ultrasound (echo/ vascular) testing. Due to room size and safety concerns, children are not allowed in the ultrasound rooms during exams. Our front office staff cannot provide observation of children in our lobby area while testing is being conducted. An adult accompanying a patient to their appointment will only be allowed in the ultrasound room at the discretion of the ultrasound technician under special circumstances. We apologize for any inconvenience.    Follow-Up: At [facility], you and your health needs are our priority.  As part of our continuing mission to provide you with exceptional heart care, we have created designated Provider Care Teams.  These Care Teams include your primary Cardiologist (physician) and Advanced Practice Providers (APPs -  Physician  Assistants and Nurse Practitioners) who all work together to provide you with the care you need, when you need it.    Your next appointment:   4-6 month(s) AFTER ECHOCARDIOGRAM  Provider:   Reatha Harps, MD   Other Instructions

## 2023-09-20 NOTE — Progress Notes (Unsigned)
 Cardiology Office Note:  .   Date:  09/21/2023  ID:  LOGON UTTECH, DOB [birth year], MRN [id number] PCP: [name], DO  [facility] Providers Cardiologist:  Reatha Harps, MD     History of Present Illness: Darius Mitchell is a 73 y.o. male with PMH of CAD, persistent A-fib s/p ablation, chronic systolic heart failure, hypertension and hyperlipidemia.  Patient was previously followed by Dr. Bing Matter but has since switched to Dr. Flora Lipps.  [name] in March 2024 showed no evidence of ischemia, EF 31%.  Echocardiogram obtained on 09/29/2022 showed EF 35 to 40% while in atrial fibrillation, moderately reduced RV systolic function, mild LVH, severe LAE, moderate MR, mild to moderate TR, moderate AI.  Previous echocardiogram in May 2021 showed EF of 60 to 65%.  He underwent A-fib ablation in July 2024.  Cardiac CT obtained prior to ablation showed no thrombus in left atrial appendage, extensive coronary artery calcification with calcium score of 8062, which placed the patient in 99th percentile for age and sex matched control.  When he was seen Dr. Bing Matter back in August 2024 for posthospital follow-up, he was noted to be severely hypotensive with blood pressure of 78/62 while in atrial flutter, he was subsequently sent to the emergency room and transferred to [facility].  He was already on amiodarone prior to the hospitalization, this was continued.  Patient underwent diagnostic cardiac catheterization on 02/09/2023 which showed multivessel CAD with severe disease involving proximal and mid LAD and the large D1 vessel, moderate disease in mid left circumflex vessel up to 50%, multiple segment of RCA disease.  Aggressive secondary prevention of coronary artery disease was recommended.  The case was discussed with HeartTeam who favored medical therapy given lack of angina.  Patient ultimately underwent successful cardioversion by Dr. Flora Lipps on 02/10/2023.  Post cardioversion, he  had hypotension and was kept in the hospital for 1 more day.  He was discharged on amiodarone 200 mg twice a day dosing.  Low-dose losartan and Jardiance started as outpatient during CHF POC visit.  He was last seen by Dr. Flora Lipps on 12//2024, low-dose Entresto started at the time.   Patient presents today for follow-up accompanied by wife.  He has self discontinued his Jardiance and Repatha.  He misunderstood the Jardiance as a diabetes medication and stop the [place].  I want him to restart the Jardiance and explained to him it is also a heart failure medication.  As for Repatha, his total cholesterol was 96 in February and LDL was only 31.  He says he actually stopped the Repatha several weeks prior to the blood work, therefore this blood work is not on the Repatha.  He does not know why he need to take the Repatha when his LDL is this low off of it.  I will recheck fasting lipid panel in August, if LDL is 55 or higher, I will ask him to resume Repatha.  Overall, he has been doing well, despite weight gain, he appears to be euvolemic on exam.  He will need repeat echocardiogram in late June before follow-up with Dr. Flora Lipps in 4 to 6 months.  ROS:   He denies chest pain, palpitations, dyspnea, pnd, orthopnea, n, v, dizziness, syncope, edema, weight gain, or early satiety. All other systems reviewed and are otherwise negative except as noted above.    Studies Reviewed: .        Cardiac Studies & Procedures   ______________________________________________________________________________________________ CARDIAC CATHETERIZATION  CARDIAC CATHETERIZATION 02/09/2023  Narrative Conclusions: Heavily calcified coronary arteries with severe disease involving the proximal/mid LAD and large D1 branch as well as multiple segments of the RCA, as detailed below.  There is also moderate mid LCx disease of up to 50%. Normal left ventricular filling pressure.  Recommendations: Aggressive secondary prevention  of coronary artery disease. Proceed with cardioversion tomorrow, as tachycardia is likely playing a large role in cardiomyopathy. Escalate GDMT for acute HFrEF likely due to mixed ischemic and nonischemic cardiomyopathy. Will review case at tomorrow's HeartTeam meeting; favor medical therapy of coronary artery disease at this time given lack of angina.  [name], MD [facility]  Findings Coronary Findings Diagnostic  Dominance: Right  Left Main Vessel is large. Vessel is angiographically normal.  Left Anterior Descending Vessel is large. The vessel is severely calcified. Prox LAD lesion is 50% stenosed. The lesion is severely calcified. Prox LAD to Mid LAD lesion is 70% stenosed with 50% stenosed side branch in 1st Diag.  First Diagonal Branch 1st Diag lesion is 75% stenosed.  Ramus Intermedius Vessel is small. Vessel is angiographically normal.  Left Circumflex Vessel is moderate in size. Mid Cx lesion is 55% stenosed.  First Obtuse Marginal Branch Vessel is small in size.  Second Obtuse Marginal Branch Vessel is moderate in size.  Right Coronary Artery Vessel is large. The vessel is severely calcified. Prox RCA lesion is 75% stenosed. The lesion is severely calcified. Dist RCA lesion is 85% stenosed. The lesion is eccentric. The lesion is severely calcified.  Right Posterior Descending Artery Vessel is small in size.  Right Posterior Atrioventricular Artery Vessel is moderate in size. RPAV lesion is 75% stenosed. The lesion is severely calcified.  First Right Posterolateral Branch Vessel is small in size.  Second Right Posterolateral Branch Vessel is small in size.  Third Right Posterolateral Branch Vessel is moderate in size.  Intervention  No interventions have been documented.   STRESS TESTS  MYOCARDIAL PERFUSION IMAGING 09/20/2022  Narrative   No evidence of ischemia. The study is intermediate risk. Rhythm is atrial fibrillation.    Left ventricular function is abnormal. Global function is moderately reduced. Nuclear stress EF: 31 %. The left ventricular ejection fraction is moderately decreased (30-44%). End diastolic cavity size is moderately enlarged.   ECHOCARDIOGRAM  ECHOCARDIOGRAM COMPLETE 09/29/2022  Narrative ECHOCARDIOGRAM REPORT    Patient Name:   TRELL SECRIST Overfelt Date of Exam: 09/29/2022 Medical Rec #:  [id number]       Height:       71.0 in Accession #:    [id number]      Weight:       205.0 lb Date of Birth:  Mar 15, 1951        BSA:          2.131 m Patient Age:    71 years        BP:           130/82 mmHg Patient Gender: M               HR:           110 bpm. Exam Location:  [place]  Procedure: 2D Echo, Cardiac Doppler, Color Doppler and Strain Analysis  Indications:    Paroxysmal atrial fibrillation [I48.0 (ICD-10-CM)]  History:        Patient has prior history of Echocardiogram examinations, most recent 11/18/2019. Dilated cardiomyopathy, Arrythmias:Atrial Fibrillation, Signs/Symptoms:Swelling of both lower extremities; Risk Factors:Dyslipidemia and Hypertension.  Sonographer:    Fayrene Fearing  Reel RDCS Referring Phys: [id number] Marveen Reeks KRASOWSKI  IMPRESSIONS   1. Atrial fibrillation. Left ventricular ejection fraction, by estimation, is 35 to 40%. Left ventricular ejection fraction by 2D MOD biplane is 40.6 %. The left ventricle has moderately decreased function. The left ventricle demonstrates global hypokinesis. There is mild concentric left ventricular hypertrophy. Left ventricular diastolic parameters are indeterminate. 2. Right ventricular systolic function is moderately reduced. The right ventricular size is normal. There is mildly elevated pulmonary artery systolic pressure. 3. Left atrial size was severely dilated. 4. The mitral valve is degenerative. Moderate mitral valve regurgitation. No evidence of mitral stenosis. 5. Tricuspid valve regurgitation is mild to moderate. 6. The aortic valve is  tricuspid. Aortic valve regurgitation is mild. Aortic valve sclerosis is present, with no evidence of aortic valve stenosis. 7. Aortic Normal DTA. There is borderline dilatation of the ascending aorta, measuring 38 mm. 8. The inferior vena cava is normal in size with greater than 50% respiratory variability, suggesting right atrial pressure of 3 mmHg.  FINDINGS Left Ventricle: Atrial fibrillation. Left ventricular ejection fraction, by estimation, is 35 to 40%. Left ventricular ejection fraction by 2D MOD biplane is 40.6 %. The left ventricle has moderately decreased function. The left ventricle demonstrates global hypokinesis. The left ventricular internal cavity size was normal in size. There is mild concentric left ventricular hypertrophy. Left ventricular diastolic parameters are indeterminate.  Right Ventricle: The right ventricular size is normal. No increase in right ventricular wall thickness. Right ventricular systolic function is moderately reduced. There is mildly elevated pulmonary artery systolic pressure. The tricuspid regurgitant velocity is 2.92 m/s, and with an assumed right atrial pressure of 3 mmHg, the estimated right ventricular systolic pressure is 37.1 mmHg.  Left Atrium: Left atrial size was severely dilated.  Right Atrium: Right atrial size was normal in size.  Pericardium: There is no evidence of pericardial effusion.  Mitral Valve: The mitral valve is degenerative in appearance. There is mild thickening of the anterior and posterior mitral valve leaflet(s). Moderate mitral valve regurgitation. No evidence of mitral valve stenosis.  Tricuspid Valve: The tricuspid valve is normal in structure. Tricuspid valve regurgitation is mild to moderate. No evidence of tricuspid stenosis.  Aortic Valve: The aortic valve is tricuspid. Aortic valve regurgitation is mild. Aortic regurgitation PHT measures 633 msec. Aortic valve sclerosis is present, with no evidence of aortic valve  stenosis.  Pulmonic Valve: The pulmonic valve was normal in structure. Pulmonic valve regurgitation is mild. No evidence of pulmonic stenosis.  Aorta: The aortic arch was not well visualized and Normal DTA. There is borderline dilatation of the ascending aorta, measuring 38 mm.  Venous: A systolic blunting flow pattern is recorded from the right upper pulmonary vein. The inferior vena cava is normal in size with greater than 50% respiratory variability, suggesting right atrial pressure of 3 mmHg.  IAS/Shunts: No atrial level shunt detected by color flow Doppler.   LEFT VENTRICLE PLAX 2D                        Biplane EF (MOD) LVIDd:         5.10 cm         LV Biplane EF:   Left LVIDs:         4.50 cm                          ventricular LV PW:  1.10 cm                          ejection LV IVS:        1.20 cm                          fraction by LVOT diam:     2.10 cm                          2D MOD LV SV:         40                               biplane is LV SV Index:   19                               40.6 %. LVOT Area:     3.46 cm Diastology LV e' medial:    4.03 cm/s LV Volumes (MOD)               LV E/e' medial:  20.7 LV vol d, MOD    80.5 ml       LV e' lateral:   4.79 cm/s A2C:                           LV E/e' lateral: 17.4 LV vol d, MOD    101.0 ml A4C: LV vol s, MOD    46.5 ml A2C: LV vol s, MOD    62.6 ml A4C: LV SV MOD A2C:   34.0 ml LV SV MOD A4C:   101.0 ml LV SV MOD BP:    36.8 ml  RIGHT VENTRICLE            IVC RV S prime:     8.39 cm/s  IVC diam: 1.60 cm TAPSE (M-mode): 1.1 cm  LEFT ATRIUM              Index        RIGHT ATRIUM           Index LA diam:        5.10 cm  2.39 cm/m   RA Area:     20.00 cm LA Vol (A2C):   157.0 ml 73.68 ml/m  RA Volume:   55.50 ml  26.05 ml/m LA Vol (A4C):   138.0 ml 64.76 ml/m LA Biplane Vol: 155.0 ml 72.74 ml/m AORTIC VALVE LVOT Vmax:   72.14 cm/s LVOT Vmean:  47.860 cm/s LVOT VTI:    0.115 m AI PHT:       633 msec  AORTA Ao Root diam: 3.60 cm Ao Asc diam:  3.80 cm Ao Desc diam: 2.00 cm  MITRAL VALVE                  TRICUSPID VALVE MV Area (PHT): 8.92 cm       TR Peak grad:   34.1 mmHg MV Decel Time: 85 msec        TR Vmax:        292.00 cm/s MR Peak grad:    87.6 mmHg MR Mean grad:    57.0 mmHg    SHUNTS MR Vmax:         468.00  cm/s  Systemic VTI:  0.12 m MR Vmean:        353.0 cm/s   Systemic Diam: 2.10 cm MR PISA:         5.09 cm MR PISA Eff ROA: 40 mm MR PISA Radius:  0.90 cm MV E velocity: 83.30 cm/s  Norman Herrlich MD Electronically signed by Norman Herrlich MD Signature Date/Time: 09/29/2022/1:46:35 PM    Final          ______________________________________________________________________________________________      Risk Assessment/Calculations:    CHA2DS2-VASc Score = 4   This indicates a 4.8% annual risk of stroke. The patient's score is based upon: CHF History: 1 HTN History: 1 Diabetes History: 0 Stroke History: 0 Vascular Disease History: 1 Age Score: 1 Gender Score: 0            Physical Exam:   VS:  BP 112/64 (BP Location: Left Arm, Patient Position: Sitting, Cuff Size: Normal)   Pulse 60   Ht 5\' 11"  (1.803 m)   Wt 192 lb (87.1 kg)   SpO2 98%   BMI 26.78 kg/m    Wt Readings from Last 3 Encounters:  09/20/23 192 lb (87.1 kg)  08/14/23 185 lb 9.6 oz (84.2 kg)  05/31/23 183 lb 3.2 oz (83.1 kg)    GEN: Well nourished, well developed in no acute distress NECK: No JVD; No carotid bruits CARDIAC: RRR, no murmurs, rubs, gallops RESPIRATORY:  Clear to auscultation without rales, wheezing or rhonchi  ABDOMEN: Soft, non-tender, non-distended EXTREMITIES:  No edema; No deformity   ASSESSMENT AND PLAN: .    Chronic Systolic Heart Failure Echocardiogram showed EF 35-40%, moderately reduced RV function, mild RVH, severe ROE, moderate MR, TR, and AI. Previous EF was 60-65%. London Pepper is part of heart failure regimen, not solely for diabetes.  Sherryll Burger is beneficial for heart failure. [place] regimen includes ARB/ARNI, spironolactone, SGLT2 inhibitors, and beta-blockers, though beta-blockers not used due to low heart rate. - Restart Jardiance. - Order repeat echocardiogram in late June. - Continue Entresto.  Coronary Artery Disease (CAD) Severe multivessel CAD with extensive calcification. Cardiac catheterization showed severe LAD disease, moderate left circumflex, and RCA disease. Recommended aggressive secondary prevention with medical therapy due to lack of ischemia. Explained no association between low cholesterol and dementia. LDL goal <55 mg/dL per European guidelines for severe CAD. - Recheck fasting lipid panel in August. If LDL >=55 mg/dL, restart Repatha.  Atrial Fibrillation and Atrial Flutter Post-ablation atrial flutter and hypotension required cardioversion. On amiodarone and Eliquis. Heart rate regular, no current AFib or flutter. - Continue Eliquis for stroke prevention.  Hypertension: Blood pressure well-controlled  Hyperlipidemia: Previous LDL was only 31 in February, however patient says he was already off of Repatha for several weeks prior to the blood work.  He does not understand why he need to take Repatha when his LDL is that low.  I will recheck fasting lipid panel here in August, if LDL is 55 or higher, I would highly recommend he restart Repatha.        Dispo: Follow-up in 4 to 6 months  Signed, Azalee Course, [place]

## 2023-10-18 ENCOUNTER — Ambulatory Visit (HOSPITAL_COMMUNITY): Payer: Medicare Other | Admitting: Internal Medicine

## 2023-10-24 ENCOUNTER — Encounter: Payer: Self-pay | Admitting: Family Medicine

## 2023-10-24 NOTE — Telephone Encounter (Signed)
 Called pt and appt scheduled for Friday.

## 2023-10-27 ENCOUNTER — Ambulatory Visit: Admitting: Family Medicine

## 2023-10-28 ENCOUNTER — Other Ambulatory Visit: Payer: Self-pay | Admitting: Cardiology

## 2023-10-28 DIAGNOSIS — I4819 Other persistent atrial fibrillation: Secondary | ICD-10-CM

## 2023-10-28 MED FILL — Sacubitril-Valsartan Tab 24-26 MG: ORAL | 30 days supply | Qty: 60 | Fill #2 | Status: AC

## 2023-10-29 ENCOUNTER — Other Ambulatory Visit (HOSPITAL_BASED_OUTPATIENT_CLINIC_OR_DEPARTMENT_OTHER): Payer: Self-pay

## 2023-10-29 MED ORDER — APIXABAN 5 MG PO TABS
ORAL_TABLET | Freq: Two times a day (BID) | ORAL | 1 refills | Status: DC
  Filled 2023-10-29: qty 180, 90d supply, fill #0
  Filled 2024-04-04: qty 180, 90d supply, fill #1

## 2023-10-30 ENCOUNTER — Ambulatory Visit (INDEPENDENT_AMBULATORY_CARE_PROVIDER_SITE_OTHER): Admitting: Family Medicine

## 2023-10-30 ENCOUNTER — Other Ambulatory Visit (HOSPITAL_BASED_OUTPATIENT_CLINIC_OR_DEPARTMENT_OTHER): Payer: Self-pay

## 2023-10-30 ENCOUNTER — Encounter: Payer: Self-pay | Admitting: Family Medicine

## 2023-10-30 VITALS — BP 124/76 | HR 65 | Temp 98.0°F | Resp 16 | Ht 71.0 in | Wt 192.6 lb

## 2023-10-30 DIAGNOSIS — F411 Generalized anxiety disorder: Secondary | ICD-10-CM | POA: Diagnosis not present

## 2023-10-30 DIAGNOSIS — E039 Hypothyroidism, unspecified: Secondary | ICD-10-CM | POA: Diagnosis not present

## 2023-10-30 DIAGNOSIS — I5020 Unspecified systolic (congestive) heart failure: Secondary | ICD-10-CM

## 2023-10-30 NOTE — Progress Notes (Signed)
 Chief Complaint  Patient presents with   Follow-up    Follow up   Referral    Referral     Subjective Darius Mitchell presents for f/u anxiety.  Pt is currently being treated with Paxil  30 mg daily.  Compliant, no adverse effects. Reports doing well since treatment. No thoughts of harming self or others. No self-medication with alcohol, prescription drugs or illicit drugs. Pt is not following with a counselor/psychologist.  Hypothyroidism Patient presents for follow-up of hypothyroidism.  Reports compliance with medication-levothyroxine  100 mcg daily.  Compliant, no adverse effects. Current symptoms include: denies fatigue, weight changes, heat/cold intolerance, bowel/skin changes or CVS symptoms He believes his dose should be not significantly changed  Over the past 3 weeks, the patient has had issues with hoarseness after speaking for 15-20 minutes.  He feels like his mouth is dry around.  Coincidentally, he restarted Jardiance  10 mg daily.  He has never taken Jardiance  while being on spironolactone  at the same time.  His urine is more concentrated.  No difficulty swallowing, runny/stuffy nose, pain.  Past Medical History:  Diagnosis Date   Afib (HCC)    Anxiety and depression 03/25/2014   BMI 30.0-30.9,adult 05/15/2015   Carpal tunnel syndrome of right wrist 10/23/2015   CHF (congestive heart failure) (HCC)    Diverticulosis of colon without hemorrhage 03/17/2016   Essential hypertension, benign 03/25/2014   GERD (gastroesophageal reflux disease)    Hyperlipidemia    Hypothyroidism    Low testosterone  05/15/2015   Paroxysmal atrial fibrillation (HCC) 12/03/2018   Prostate cancer screening encounter, options and risks discussed 04/30/2014   Screening for ischemic heart disease 04/30/2014   Swelling of both lower extremities 11/29/2017   Visit for preventive health examination 04/30/2014   Allergies as of 10/30/2023       Reactions   Codeine Nausea Only   GI  Intolerance   Niacin Other (See Comments)   Flushing        Medication List        Accurate as of Oct 30, 2023  3:25 PM. If you have any questions, ask your nurse or doctor.          STOP taking these medications    Repatha  SureClick 140 MG/ML Soaj Generic drug: Evolocumab  Stopped by: Shellie Dials Tremar Wickens       TAKE these medications    atorvastatin  80 MG tablet Commonly known as: LIPITOR  TAKE 1 TABLET BY MOUTH EVERY DAY   Centrum Silver  Ultra Mens Tabs Take 1 tablet by mouth in the morning.   Eliquis  5 MG Tabs tablet Generic drug: apixaban  Take 1 tablet by mouth 2 (two) times daily.   Entresto  24-26 MG Generic drug: sacubitril -valsartan  Take 1 tablet by mouth 2 (two) times daily. What changed: Another medication with the same name was removed. Continue taking this medication, and follow the directions you see here. Changed by: Jobe Mulder   Jardiance  10 MG Tabs tablet Generic drug: empagliflozin  TAKE 1 TABLET BY MOUTH EVERY DAY   levothyroxine  100 MCG tablet Commonly known as: SYNTHROID  TAKE 1 TABLET BY MOUTH EVERY DAY   Neuriva Caps Take 1 capsule by mouth daily.   OMEGA 3 PO Take 1 capsule by mouth daily.   omeprazole  20 MG capsule Commonly known as: PRILOSEC Take 1 capsule (20 mg total) by mouth daily.   OSTEO BI-FLEX ONE PER DAY PO Take 1 tablet by mouth daily.   PARoxetine  30 MG tablet Commonly known as: Paxil  Take 1 tablet (  30 mg total) by mouth daily.   spironolactone  25 MG tablet Commonly known as: Aldactone  Take 1 tablet (25 mg total) by mouth daily.   Ubiquinol 200 MG Caps Take 1 capsule by mouth daily.   VITAMIN K2-VITAMIN D3 PO Take 1 tablet by mouth daily.        Exam BP 124/76 (BP Location: Left Arm, Patient Position: Sitting)   Pulse 65   Temp 98 F (36.7 C) (Oral)   Resp 16   Ht 5\' 11"  (1.803 m)   Wt 192 lb 9.6 oz (87.4 kg)   SpO2 99%   BMI 26.86 kg/m  General:  well developed, well nourished,  in no apparent distress Mouth: MMM, no pharyngeal exudate or erythema Heart: RRR Lungs: CTAB. No respiratory distress Psych: well oriented with normal range of affect and age-appropriate judgement/insight, alert and oriented x4.  Assessment and Plan  GAD (generalized anxiety disorder)  Hypothyroidism, unspecified type - Plan: TSH, T4, free  HFrEF (heart failure with reduced ejection fraction) (HCC) - Plan: Basic metabolic panel with GFR  Chronic, stable.  Continue Paxil  30 mg daily. Chronic, stable.  Continue levothyroxine  100 mcg daily. Will check BMP today.  He has an appointment with his cardiologist in 2 weeks.  He will bring this up with them.  If Dr. Rolm Clos does not think this is related to his medicine, the patient will let me know and I will refer him to the ENT team for direct visualization. F/u in 6 months. The patient voiced understanding and agreement to the plan.  Shellie Dials [place], DO 10/30/23 3:25 PM

## 2023-10-30 NOTE — Patient Instructions (Signed)
 Give us  2-3 business days to get the results of your labs back.   Send me a message if Dr. Deanna Expose does not think your voice issue is related to his medicine.   Let us  know if you need anything.

## 2023-10-31 ENCOUNTER — Encounter: Payer: Self-pay | Admitting: Family Medicine

## 2023-10-31 LAB — BASIC METABOLIC PANEL WITH GFR
BUN: 21 mg/dL (ref 6–23)
CO2: 29 meq/L (ref 19–32)
Calcium: 9.6 mg/dL (ref 8.4–10.5)
Chloride: 103 meq/L (ref 96–112)
Creatinine, Ser: 1.06 mg/dL (ref 0.40–1.50)
GFR: 68.98 mL/min (ref >60.00)
Glucose, Bld: 89 mg/dL (ref 70–99)
Potassium: 4.1 meq/L (ref 3.5–5.1)
Sodium: 139 meq/L (ref 135–145)

## 2023-10-31 LAB — TSH: TSH: 0.7 u[IU]/mL (ref 0.35–5.50)

## 2023-10-31 LAB — T4, FREE: Free T4: 0.71 ng/dL (ref 0.60–1.60)

## 2023-11-12 ENCOUNTER — Other Ambulatory Visit: Payer: Self-pay | Admitting: Family Medicine

## 2023-11-13 ENCOUNTER — Encounter: Payer: Self-pay | Admitting: Family Medicine

## 2023-11-14 MED ORDER — OMEPRAZOLE 20 MG PO CPDR: 20.0000 mg | DELAYED_RELEASE_CAPSULE | Freq: Every day | ORAL | 1 refills | Status: DC

## 2023-12-04 ENCOUNTER — Other Ambulatory Visit (HOSPITAL_BASED_OUTPATIENT_CLINIC_OR_DEPARTMENT_OTHER): Payer: Self-pay

## 2023-12-04 ENCOUNTER — Other Ambulatory Visit: Payer: Self-pay | Admitting: Cardiovascular Disease

## 2023-12-05 ENCOUNTER — Encounter: Payer: Self-pay | Admitting: Cardiovascular Disease

## 2023-12-05 ENCOUNTER — Other Ambulatory Visit (HOSPITAL_BASED_OUTPATIENT_CLINIC_OR_DEPARTMENT_OTHER): Payer: Self-pay

## 2023-12-05 MED ORDER — ENTRESTO 24-26 MG PO TABS
1.0000 | ORAL_TABLET | Freq: Two times a day (BID) | ORAL | 3 refills | Status: AC
  Filled 2023-12-05: qty 180, 90d supply, fill #0
  Filled 2024-03-05: qty 180, 90d supply, fill #1
  Filled 2024-06-03: qty 180, 90d supply, fill #2

## 2023-12-05 NOTE — Telephone Encounter (Signed)
 RN called patient and verified with patient medication. Patient request to receive a 90 day order instead of monthly.  RN informed patient will send in prescription  Patient thanked RN.

## 2023-12-05 NOTE — Telephone Encounter (Signed)
 RN spoke to  [employer]  [facility]   To explain that Entresto  was cancelled at patient primary visit in May 5,2025.   Phil  states that Entresto   prescription  was denied earlier today as well.  RN  informed Gwynn Lesches - will call back  RN called pharmacy back - medication was  e-sent -   Gwynn Lesches stated he sees prescription- he also states  the provider was trying to clean up medication list and cancelled the wrong prescription.

## 2023-12-05 NOTE — Telephone Encounter (Addendum)
 Patient was informed  the medication had been cancelled  at 10/30/23.  The medication was restated the same visit. RN will call pharmacy to refill medication .

## 2023-12-09 ENCOUNTER — Other Ambulatory Visit: Payer: Self-pay | Admitting: Family Medicine

## 2023-12-11 ENCOUNTER — Encounter: Payer: Self-pay | Admitting: Pharmacist

## 2023-12-11 NOTE — Progress Notes (Signed)
 Pharmacy Quality Measure Review  This patient is appearing on a report for being at risk of failing the adherence measure for diabetes medications this calendar year.   Medication: Jardiance  Last fill date: 10/14/2023 for 30 day supply per adherence report  Reviewed Dr Anson Basta database and patient filled Jardiance  for 30 day supply on 11/10/2023 and today 12/11/2023  Insurance report was not up to date. No action needed at this time.   Cecilie Coffee, PharmD Clinical Pharmacist [facility]

## 2023-12-19 ENCOUNTER — Ambulatory Visit (HOSPITAL_COMMUNITY)
Admission: RE | Admit: 2023-12-19 | Discharge: 2023-12-19 | Disposition: A | Discharge status: Home / Self Care | Source: Ambulatory Visit | Attending: Internal Medicine | Admitting: Internal Medicine

## 2023-12-19 DIAGNOSIS — I5022 Chronic systolic (congestive) heart failure: Secondary | ICD-10-CM

## 2023-12-19 LAB — ECHOCARDIOGRAM COMPLETE
Area-P 1/2: 1.44 cm2
P 1/2 time: 653 ms
S' Lateral: 3.8 cm

## 2023-12-21 ENCOUNTER — Ambulatory Visit: Payer: Self-pay | Admitting: Physician Assistant

## 2024-01-02 ENCOUNTER — Encounter: Payer: Self-pay | Admitting: Family Medicine

## 2024-01-28 ENCOUNTER — Other Ambulatory Visit: Payer: Self-pay | Admitting: Family Medicine

## 2024-02-04 ENCOUNTER — Encounter: Payer: Self-pay | Admitting: Family Medicine

## 2024-02-07 ENCOUNTER — Other Ambulatory Visit: Payer: Self-pay | Admitting: Family Medicine

## 2024-02-27 NOTE — Progress Notes (Signed)
 Pharmacy Quality Measure Review  This patient is appearing on a report for being at risk of failing the adherence measure for diabetes medications this calendar year.   Medication: Jardiance  10 mg daily Last fill date: 02/08/24 for 90 day supply  Insurance report was not up to date. No action needed at this time.   Jenkins Graces, PharmD PGY1 Pharmacy Resident [phone]

## 2024-02-28 NOTE — Progress Notes (Signed)
 02/28/2024 - Reviewed notes from [facility], [place] below. Patient will need updated Rx for next fill but he is also due to see PCP around November 2025 (see notes from Dr Gena visit in May 2025 - recommended follow u in 6 months).  Forwarding request to make appt to Dr Gena scheduler.   Madelin Ray, PharmD Clinical Pharmacist [facility]

## 2024-03-05 ENCOUNTER — Other Ambulatory Visit (HOSPITAL_BASED_OUTPATIENT_CLINIC_OR_DEPARTMENT_OTHER): Payer: Self-pay

## 2024-03-07 ENCOUNTER — Encounter: Payer: Self-pay | Admitting: Cardiovascular Disease

## 2024-03-18 DIAGNOSIS — L570 Actinic keratosis: Secondary | ICD-10-CM | POA: Diagnosis not present

## 2024-04-04 ENCOUNTER — Other Ambulatory Visit (HOSPITAL_BASED_OUTPATIENT_CLINIC_OR_DEPARTMENT_OTHER): Payer: Self-pay

## 2024-04-05 ENCOUNTER — Other Ambulatory Visit (HOSPITAL_BASED_OUTPATIENT_CLINIC_OR_DEPARTMENT_OTHER): Payer: Self-pay

## 2024-04-06 ENCOUNTER — Encounter: Payer: Self-pay | Admitting: Family Medicine

## 2024-05-04 ENCOUNTER — Other Ambulatory Visit: Payer: Self-pay | Admitting: Family Medicine

## 2024-05-06 NOTE — Progress Notes (Unsigned)
 Cardiology Office Note:  .   Date:  05/08/2024  ID:  Mitchell Darius, DOB 1951-04-23, MRN [id number] PCP: Frann Mabel Mt, DO  [facility] Providers Cardiologist:  Darryle ONEIDA Decent, MD { History of Present Illness: .    Chief Complaint  Patient presents with   Follow-up    MAVRYK Mitchell is a 73 y.o. male with history of CHF, HLD, HTN who presents for follow-up.    History of Present Illness   Darius Mitchell is a 73 year old male with systolic heart failure, multivessel coronary artery disease, and persistent atrial fibrillation who presents for follow-up.  He feels great with high energy levels and no complaints. He has difficulty falling asleep, which he attributes to his cats waking him up at night. He occasionally uses melatonin to aid sleep but is concerned about its potential side effects.  He has a history of systolic heart failure with recovery of ejection fraction, now at 55-60%. He underwent atrial fibrillation ablation in 2022 and a cardioversion last year, with no recurrence of atrial fibrillation since then. He continues on Entresto  24/26 mg BID, Aldactone  25 mg daily, and Bystolic 10 mg daily. No dizziness, lightheadedness, or chest pains are reported.  He has multivessel coronary artery disease managed medically due to the absence of angina. He is on Eliquis  and not on aspirin . His most recent LDL cholesterol was 31, and he continues on Lipitor  80 mg daily. He is concerned about his blood pressure being too low, but he has not experienced any symptoms like dizziness or lightheadedness.  No dizziness, lightheadedness, chest pains, or swelling. He reports snoring but cannot use a CPAP machine.          Problem List Systolic HF -LVEF 35-40% 09/2022 -LVEF 55-60% 11/2023 2. CAD -70% mid LAD; 75% D1; 55% LCX; 85% RCA -> Med Management recommended  3. Persistent Afib -PVI 01/16/2023 -DCCV 02/10/2023 4. HLD -T chol 96, HDL 50, LDL 31, TG 70     ROS: All other ROS reviewed and negative. Pertinent positives noted in the HPI.     Studies Reviewed: SABRA   EKG Interpretation Date/Time:  Wednesday May 08 2024 11:07:47 EST Ventricular Rate:  61 PR Interval:  166 QRS Duration:  102 QT Interval:  414 QTC Calculation: 416 R Axis:   -10  Text Interpretation: Normal sinus rhythm Normal ECG Confirmed by Decent Darryle [phone]) on 05/08/2024 11:09:05 AM   LHC 02/09/2023 Conclusions: Heavily calcified coronary arteries with severe disease involving the proximal/mid LAD and large D1 branch as well as multiple segments of the RCA, as detailed below.  There is also moderate mid LCx disease of up to 50%. Normal left ventricular filling pressure.   TTE 12/19/2023  1. Left ventricular ejection fraction, by estimation, is 55 to 60%. Left  ventricular ejection fraction by 3D volume is 57 %. The left ventricle has  normal function. The left ventricle has no regional wall motion  abnormalities. There is mild concentric  left ventricular hypertrophy. Left ventricular diastolic parameters are  consistent with Grade I diastolic dysfunction (impaired relaxation). The  average left ventricular global longitudinal strain is -18.9 %. The global  longitudinal strain is normal.   2. Right ventricular systolic function is normal. The right ventricular  size is normal. There is normal pulmonary artery systolic pressure. The  estimated right ventricular systolic pressure is 29.4 mmHg.   3. Left atrial size was mildly dilated.   4. The mitral valve is  normal in structure. Trivial mitral valve  regurgitation. No evidence of mitral stenosis.   5. The aortic valve is tricuspid. There is mild calcification of the  aortic valve. Aortic valve regurgitation is mild to moderate. Aortic valve  sclerosis/calcification is present, without any evidence of aortic  stenosis. Aortic regurgitation PHT  measures 653 msec.   6. Aortic dilatation noted. There is  borderline dilatation of the aortic  root, measuring 38 mm. There is mild dilatation of the ascending aorta,  measuring 40 mm.   7. The inferior vena cava is normal in size with greater than 50%  respiratory variability, suggesting right atrial pressure of 3 mmHg.  Physical Exam:   VS:  BP 100/60 (BP Location: Right Arm, Patient Position: Sitting, Cuff Size: Normal)   Pulse 61   Ht 5' 11 (1.803 m)   Wt 195 lb 9.6 oz (88.7 kg)   SpO2 99%   BMI 27.28 kg/m    Wt Readings from Last 3 Encounters:  05/08/24 195 lb 9.6 oz (88.7 kg)  10/30/23 192 lb 9.6 oz (87.4 kg)  09/20/23 192 lb (87.1 kg)    GEN: Well nourished, well developed in no acute distress NECK: No JVD; No carotid bruits CARDIAC: RRR, no murmurs, rubs, gallops RESPIRATORY:  Clear to auscultation without rales, wheezing or rhonchi  ABDOMEN: Soft, non-tender, non-distended EXTREMITIES:  No edema; No deformity  ASSESSMENT AND PLAN: .   Assessment and Plan    Systolic heart failure with recovered ejection fraction Ejection fraction improved to 55-60%. Blood pressure low but stable. Benefits of normal heart function outweigh low blood pressure risks. - Continue Entresto  24/26 mg BID. - Continue Aldactone  25 mg daily. - Continue Bystolic 10 mg daily. - Monitor for symptoms of dizziness, lightheadedness, or chest pain.  Persistent atrial fibrillation, status post ablation and cardioversion, in sinus rhythm In sinus rhythm post-ablation and cardioversion. No AFib recurrence. On Eliquis  for anticoagulation. - Continue Eliquis  indefinitely.  Atherosclerotic heart disease of native coronary artery without angina Multivessel coronary artery disease managed medically. No angina or acute ischemic changes on EKG. Not on aspirin  due to Eliquis . - Continue medical management for CAD.  Mixed hyperlipidemia Well-controlled with LDL at 31 mg/dL. - Continue Lipitor  80 mg daily.              Follow-up: Return in about 1 year (around  05/08/2025).  Signed, Darryle DASEN. Barbaraann, MD, [facility]  [facility]  [address] [place], [place] [address] [phone]  11:39 AM

## 2024-05-08 ENCOUNTER — Ambulatory Visit: Attending: Student in an Organized Health Care Education/Training Program | Admitting: Cardiovascular Disease

## 2024-05-08 ENCOUNTER — Encounter: Payer: Self-pay | Admitting: Cardiovascular Disease

## 2024-05-08 VITALS — BP 100/60 | HR 61 | Ht 71.0 in | Wt 195.6 lb

## 2024-05-08 DIAGNOSIS — I5022 Chronic systolic (congestive) heart failure: Secondary | ICD-10-CM | POA: Diagnosis not present

## 2024-05-08 DIAGNOSIS — I2510 Atherosclerotic heart disease of native coronary artery without angina pectoris: Secondary | ICD-10-CM

## 2024-05-08 DIAGNOSIS — E782 Mixed hyperlipidemia: Secondary | ICD-10-CM

## 2024-05-08 DIAGNOSIS — I4819 Other persistent atrial fibrillation: Secondary | ICD-10-CM

## 2024-05-08 DIAGNOSIS — I10 Essential (primary) hypertension: Secondary | ICD-10-CM

## 2024-05-08 NOTE — Patient Instructions (Addendum)
 Medication Instructions:  Continue same medications *If you need a refill on your cardiac medications before your next appointment, please call your pharmacy*  Lab Work: None ordered   Testing/Procedures: None ordered  Follow-Up: At [facility], you and your health needs are our priority.  As part of our continuing mission to provide you with exceptional heart care, our providers are all part of one team.  This team includes your primary Cardiologist (physician) and Advanced Practice Providers or APPs (Physician Assistants and Nurse Practitioners) who all work together to provide you with the care you need, when you need it.  Your next appointment:  1 Year    Call in July to schedule Nov appointment     Provider:  Pa   We recommend signing up for the patient portal called MyChart.  Sign up information is provided on this After Visit Summary.  MyChart is used to connect with patients for Virtual Visits (Telemedicine).  Patients are able to view lab/test results, encounter notes, upcoming appointments, etc.  Non-urgent messages can be sent to your provider as well.   To learn more about what you can do with MyChart, go to [email or web address].

## 2024-05-25 ENCOUNTER — Other Ambulatory Visit: Payer: Self-pay | Admitting: Cardiovascular Disease

## 2024-05-30 ENCOUNTER — Ambulatory Visit: Admitting: *Deleted

## 2024-05-30 ENCOUNTER — Telehealth: Payer: Self-pay | Admitting: Cardiovascular Disease

## 2024-05-30 VITALS — BP 90/60 | HR 50 | Ht 71.0 in | Wt 191.0 lb

## 2024-05-30 DIAGNOSIS — Z1211 Encounter for screening for malignant neoplasm of colon: Secondary | ICD-10-CM | POA: Diagnosis not present

## 2024-05-30 DIAGNOSIS — Z0000 Encounter for general adult medical examination without abnormal findings: Secondary | ICD-10-CM

## 2024-05-30 NOTE — Telephone Encounter (Signed)
 Spoke with pt who complains of feeling dehydrated, concentrated urine color and nightmares over the past 2 months.  Pt states he feels this is related to his Spironolactone .  He reports brisk urination with taking the medication.  Pt reports he drinks 4-5 16.9 oz bottles of water daily along with milk, coffee and other fluids. He denies any recent new medication changes.  Pt would like to decrease dose of Spironolactone  or possible d/c to see if there is any improvement in his symptoms. Pt advised will forward to Dr Barbaraann for further recommendation.  Pt verbalizes understanding and thanked CHARITY FUNDRAISER for the callback.

## 2024-05-30 NOTE — Telephone Encounter (Signed)
 Spoke to patient Dr.O'Neal advised to stop spironolactone  to see if symptoms improve.

## 2024-05-30 NOTE — Patient Instructions (Addendum)
 Mr. Tadros,  Thank you for taking the time for your Medicare Wellness Visit. I appreciate your continued commitment to your health goals. Please review the care plan we discussed, and feel free to reach out if I can assist you further.  Please note that Annual Wellness Visits do not include a physical exam. Some assessments may be limited, especially if the visit was conducted virtually. If needed, we may recommend an in-person follow-up with your provider.  Ongoing Care Seeing your primary care provider every 3 to 6 months helps us  monitor your health and provide consistent, personalized care.   Dr Frann: 07/10/24 10:15am Medicare AWV:  06/03/25 1:40pm, telephone  Referrals If a referral was made during today's visit and you haven't received any updates within two weeks, please contact the referred provider directly to check on the status.  [facility]:  Please call to schedule the consult for your next colonoscopy.  [phone]  Recommended Screenings:  You will need to get the following vaccines at your local pharmacy: Tetanus  Health Maintenance  Topic Date Due   Medicare Annual Wellness Visit  Never done   DTaP/Tdap/Td vaccine (2 - Td or Tdap) 06/27/2020   Colon Cancer Screening  02/29/2024   COVID-19 Vaccine (9 - Pfizer risk 2025-26 season) 11/02/2024   Pneumococcal Vaccine for age over 23  Completed   Flu Shot  Completed   Hepatitis C Screening  Completed   Zoster (Shingles) Vaccine  Completed   Meningitis B Vaccine  Aged Out       05/27/2024   10:15 AM  Advanced Directives  Does Patient Have a Medical Advance Directive? Yes  Type of Estate Agent of [place];Living will  Does patient want to make changes to medical advance directive? No - Patient declined  Copy of Healthcare Power of Attorney in Chart? No - copy requested  Would patient like information on creating a medical advance directive? No - Patient declined  Please bring a copy of  your health care power of attorney and living will to the office to be added to your chart at your convenience. You can mail a copy to [facility] [address] [place], [place] [address] or email to [email or web address]   Vision: Annual vision screenings are recommended for early detection of glaucoma, cataracts, and diabetic retinopathy. These exams can also reveal signs of chronic conditions such as diabetes and high blood pressure.  Dental: Annual dental screenings help detect early signs of oral cancer, gum disease, and other conditions linked to overall health, including heart disease and diabetes.  Please see the attached documents for additional preventive care recommendations.

## 2024-05-30 NOTE — Telephone Encounter (Signed)
 Pt c/o medication issue:  1. Name of Medication: spironolactone  (ALDACTONE ) 25 MG tablet   2. How are you currently taking this medication (dosage and times per day)? As written  3. Are you having a reaction (difficulty breathing--STAT)? No   4. What is your medication issue? Feels dehydrated, really yellow urine, having bad dreams. He thinks it all may be coming from this medicaiton

## 2024-05-30 NOTE — Progress Notes (Signed)
 Please attest this visit in the absence of patient primary care provider.   Chief Complaint  Patient presents with   Medicare Wellness     Subjective:   Darius Mitchell is a 73 y.o. male who presents for a Medicare Annual Wellness Visit.  Visit info / Clinical Intake: Medicare Wellness Visit Type:: Initial Annual Wellness Visit Persons participating in visit and providing information:: patient Medicare Wellness Visit Mode:: Telephone If telephone:: video declined Since this visit was completed virtually, some vitals may be partially provided or unavailable. Missing vitals are due to the limitations of the virtual format.: Documented vitals are patient reported If Telephone or Video please confirm:: I connected with patient using audio/video enable telemedicine. I verified patient identity with two identifiers, discussed telehealth limitations, and patient agreed to proceed. Patient Location:: HOME Provider Location:: office Interpreter Needed?: No Pre-visit prep was completed: yes AWV questionnaire completed by patient prior to visit?: yes Date:: 05/27/24 Living arrangements:: lives with spouse/significant other Patient's Overall Health Status Rating: excellent Typical amount of pain: none Does pain affect daily life?: no Are you currently prescribed opioids?: no  Dietary Habits and Nutritional Risks How many meals a day?: 3 Eats fruit and vegetables daily?: yes Most meals are obtained by: preparing own meals In the last 2 weeks, have you had any of the following?: none Diabetic:: no  Functional Status Activities of Daily Living (to include ambulation/medication): (Patient-Rptd) Independent Ambulation: (Patient-Rptd) Independent Medication Administration: Independent Home Management (perform basic housework or laundry): (Patient-Rptd) Independent Manage your own finances?: yes Primary transportation is: driving Concerns about vision?: no *vision screening is required  for [facility]* (up to date with dr in [place], can't remember name) Concerns about hearing?: no  Fall Screening Falls in the past year?: (Patient-Rptd) 0 Number of falls in past year: (Patient-Rptd) 0 Was there an injury with Fall?: (Patient-Rptd) 0 Fall Risk Category Calculator: (Patient-Rptd) 0 Patient Fall Risk Level: (Patient-Rptd) Low Fall Risk  Fall Risk Patient at Risk for Falls Due to: No Fall Risks Fall risk Follow up: Falls evaluation completed  Home and Transportation Safety: All rugs have non-skid backing?: (!) no All stairs or steps have railings?: yes Grab bars in the bathtub or shower?: yes Have non-skid surface in bathtub or shower?: yes Good home lighting?: yes Regular seat belt use?: yes Hospital stays in the last year:: no  Cognitive Assessment Difficulty concentrating, remembering, or making decisions? : no Will 6CIT or Mini Cog be Completed: yes What year is it?: 0 points What month is it?: 0 points Give patient an address phrase to remember (5 components): [address] [place]  About what time is it?: 0 points Count backwards from 20 to 1: 0 points Say the months of the year in reverse: 2 points Repeat the address phrase from earlier: 4 points 6 CIT Score: 6 points  Advance Directives (For Healthcare) Does Patient Have a Medical Advance Directive?: Yes Does patient want to make changes to medical advance directive?: No - Patient declined Type of Advance Directive: Healthcare Power of [place]; Living will Copy of Healthcare Power of Attorney in Chart?: No - copy requested Copy of Living Will in Chart?: No - copy requested Would patient like information on creating a medical advance directive?: No - Patient declined  Reviewed/Updated  Reviewed/Updated: Reviewed All (Medical, Surgical, Family, Medications, Allergies, Care Teams, Patient Goals)    Allergies (verified) Codeine and Niacin   Current Medications (verified) Outpatient Encounter  Medications as of 05/30/2024  Medication Sig  apixaban  (ELIQUIS ) 5 MG TABS tablet Take 1 tablet by mouth 2 (two) times daily.   atorvastatin  (LIPITOR ) 80 MG tablet TAKE 1 TABLET BY MOUTH EVERY DAY   Boswellia-Glucosamine-Vit D (OSTEO BI-FLEX ONE PER DAY PO) Take 1 tablet by mouth daily.   JARDIANCE  10 MG TABS tablet TAKE 1 TABLET BY MOUTH EVERY DAY   levothyroxine  (SYNTHROID ) 100 MCG tablet TAKE 1 TABLET BY MOUTH EVERY DAY   Multiple Vitamins-Minerals (CENTRUM SILVER  ULTRA MENS) TABS Take 1 tablet by mouth in the morning.   Omega-3 Fatty Acids (OMEGA 3 PO) Take 1 capsule by mouth daily.   omeprazole  (PRILOSEC) 20 MG capsule Take 1 capsule (20 mg total) by mouth daily.   OVER THE COUNTER MEDICATION Take 1 capsule by mouth daily. MINDLAB PRO   PARoxetine  (PAXIL ) 30 MG tablet Take 1 tablet (30 mg total) by mouth daily.   sacubitril -valsartan  (ENTRESTO ) 24-26 MG Take 1 tablet by mouth 2 (two) times daily.   Ubiquinol 200 MG CAPS Take 1 capsule by mouth daily.   Vitamin D-Vitamin K (VITAMIN K2-VITAMIN D3 PO) Take 1 tablet by mouth daily.   [DISCONTINUED] Misc Natural Products (NEURIVA) CAPS Take 1 capsule by mouth daily.   No facility-administered encounter medications on file as of 05/30/2024.    History: Past Medical History:  Diagnosis Date   Afib (HCC)    Anxiety and depression 03/25/2014   BMI 30.0-30.9,adult 05/15/2015   Carpal tunnel syndrome of right wrist 10/23/2015   CHF (congestive heart failure) (HCC)    Diverticulosis of colon without hemorrhage 03/17/2016   Essential hypertension, benign 03/25/2014   GERD (gastroesophageal reflux disease)    Hyperlipidemia    Hypothyroidism    Low testosterone  05/15/2015   Paroxysmal atrial fibrillation (HCC) 12/03/2018   Prostate cancer screening encounter, options and risks discussed 04/30/2014   Screening for ischemic heart disease 04/30/2014   Swelling of both lower extremities 11/29/2017   Visit for preventive health examination  04/30/2014   Past Surgical History:  Procedure Laterality Date   APPENDECTOMY  1971   ATRIAL FIBRILLATION ABLATION N/A 01/16/2023   Procedure: ATRIAL FIBRILLATION ABLATION;  Surgeon: Inocencio Soyla Lunger, MD;  Location: [facility] INVASIVE CV LAB;  Service: Cardiovascular;  Laterality: N/A;   CARDIOVERSION N/A 11/02/2022   Procedure: CARDIOVERSION;  Surgeon: Sheena Pugh, DO;  Location: [facility] INVASIVE CV LAB;  Service: Cardiovascular;  Laterality: N/A;   CARDIOVERSION N/A 11/24/2022   Procedure: CARDIOVERSION;  Surgeon: Jeffrie Oneil BROCKS, MD;  Location: [facility] INVASIVE CV LAB;  Service: Cardiovascular;  Laterality: N/A;   CARDIOVERSION N/A 02/10/2023   Procedure: CARDIOVERSION;  Surgeon: Barbaraann Darryle Ned, MD;  Location: [facility] INVASIVE CV LAB;  Service: Cardiovascular;  Laterality: N/A;   LEFT HEART CATH AND CORONARY ANGIOGRAPHY N/A 02/09/2023   Procedure: LEFT HEART CATH AND CORONARY ANGIOGRAPHY;  Surgeon: Mady Bruckner, MD;  Location: [facility] INVASIVE CV LAB;  Service: Cardiovascular;  Laterality: N/A;   WISDOM TOOTH EXTRACTION  1972   Family History  Problem Relation Age of Onset   Stroke Mother 36       Deceased   Heart attack Mother    Lymphoma Father 46       Deceased   Heart disease Father    Stroke Maternal Grandmother    Stomach cancer Paternal Grandfather    Healthy Brother        x2   Healthy Sister    Hypertension Daughter    Healthy Daughter    Social History   Occupational History  Not on file  Tobacco Use   Smoking status: Former    Current packs/day: 0.00    Average packs/day: 0.3 packs/day for 40.0 years (10.0 ttl pk-yrs)    Types: Cigarettes    Start date: 05/28/1979    Quit date: 05/28/2019    Years since quitting: 5.0   Smokeless tobacco: Never  Vaping Use   Vaping status: Never Used  Substance and Sexual Activity   Alcohol use: Never   Drug use: Never   Sexual activity: Not on file   Tobacco Counseling Counseling given: Not Answered  SDOH Screenings   Food  Insecurity: No Food Insecurity (05/27/2024)  Housing: Low Risk  (05/27/2024)  Transportation Needs: No Transportation Needs (05/27/2024)  Utilities: Not At Risk (05/30/2024)  Alcohol Screen: Low Risk  (05/27/2024)  Depression (PHQ2-9): Low Risk  (05/30/2024)  Financial Resource Strain: Low Risk  (05/27/2024)  Physical Activity: Sufficiently Active (05/27/2024)  Social Connections: Moderately Isolated (05/27/2024)  Stress: No Stress Concern Present (05/27/2024)  Tobacco Use: Medium Risk (05/30/2024)   See flowsheets for full screening details  Depression Screen PHQ 2 & 9 Depression Scale- Over the past 2 weeks, how often have you been bothered by any of the following problems? Little interest or pleasure in doing things: 0 Feeling down, depressed, or hopeless (PHQ Adolescent also includes...irritable): 0 PHQ-2 Total Score: 0 Trouble falling or staying asleep, or sleeping too much: 0 Feeling tired or having little energy: 0 Poor appetite or overeating (PHQ Adolescent also includes...weight loss): 0 Feeling bad about yourself - or that you are a failure or have let yourself or your family down: 0 Trouble concentrating on things, such as reading the newspaper or watching television (PHQ Adolescent also includes...like school work): 0 Moving or speaking so slowly that other people could have noticed. Or the opposite - being so fidgety or restless that you have been moving around a lot more than usual: 0 Thoughts that you would be better off dead, or of hurting yourself in some way: 0 PHQ-9 Total Score: 0 If you checked off any problems, how difficult have these problems made it for you to do your work, take care of things at home, or get along with other people?: Not difficult at all     Goals Addressed   None          Objective:    Today's Vitals   05/30/24 1342  BP: 90/60  Pulse: (!) 50  Weight: 191 lb (86.6 kg)  Height: 5' 11 (1.803 m)   Body mass index is 26.64  kg/m.  Hearing/Vision screen No results found. Immunizations and Health Maintenance Health Maintenance  Topic Date Due   DTaP/Tdap/Td (2 - Td or Tdap) 06/27/2020   Colonoscopy  02/29/2024   COVID-19 Vaccine (9 - Pfizer risk 2025-26 season) 11/02/2024   Medicare Annual Wellness (AWV)  05/30/2025   Pneumococcal Vaccine: 50+ Years  Completed   Influenza Vaccine  Completed   Hepatitis C Screening  Completed   Zoster Vaccines- Shingrix  Completed   Meningococcal B Vaccine  Aged Out        Assessment/Plan:  This is a routine wellness examination for Bernadette.  Patient Care Team: Frann Mabel Mt, DO as PCP - General (Family Medicine) O'Neal, Darryle Ned, MD as PCP - Cardiology (Cardiology) Murriel Barb, MD as Referring Physician (Gastroenterology)  I have personally reviewed and noted the following in the patient's chart:   Medical and social history Use of alcohol, tobacco or illicit drugs  Current medications and supplements including opioid prescriptions. Functional ability and status Nutritional status Physical activity Advanced directives List of other physicians Hospitalizations, surgeries, and ER visits in previous 12 months Vitals Screenings to include cognitive, depression, and falls Referrals and appointments  No orders of the defined types were placed in this encounter.  In addition, I have reviewed and discussed with patient certain preventive protocols, quality metrics, and best practice recommendations. A written personalized care plan for preventive services as well as general preventive health recommendations were provided to patient.   Lolita Libra, CMA   05/30/2024   Return in 1 year (on 05/30/2025).  After Visit Summary: (MyChart) Due to this being a telephonic visit, the after visit summary with patients personalized plan was offered to patient via MyChart   Nurse Notes: nothing significant to report

## 2024-06-03 ENCOUNTER — Other Ambulatory Visit (HOSPITAL_BASED_OUTPATIENT_CLINIC_OR_DEPARTMENT_OTHER): Payer: Self-pay

## 2024-06-27 ENCOUNTER — Other Ambulatory Visit: Payer: Self-pay | Admitting: Cardiovascular Disease

## 2024-06-27 DIAGNOSIS — I4819 Other persistent atrial fibrillation: Secondary | ICD-10-CM

## 2024-06-28 ENCOUNTER — Other Ambulatory Visit (HOSPITAL_BASED_OUTPATIENT_CLINIC_OR_DEPARTMENT_OTHER): Payer: Self-pay

## 2024-06-28 MED ORDER — APIXABAN 5 MG PO TABS
ORAL_TABLET | Freq: Two times a day (BID) | ORAL | 1 refills | Status: AC
  Filled 2024-06-28 – 2024-07-29 (×2): qty 180, 90d supply, fill #0
  Filled 2024-07-30: qty 60, 30d supply, fill #0

## 2024-06-28 NOTE — Telephone Encounter (Signed)
 Prescription refill request for Eliquis  received. Indication: a fib Last office visit: 05/08/24 Scr:  1.06 epic 10/30/23 Age: 74 Weight: 86 kg

## 2024-07-10 ENCOUNTER — Ambulatory Visit: Admitting: Family Medicine

## 2024-07-10 ENCOUNTER — Other Ambulatory Visit (HOSPITAL_BASED_OUTPATIENT_CLINIC_OR_DEPARTMENT_OTHER): Payer: Self-pay

## 2024-07-28 ENCOUNTER — Encounter: Payer: Self-pay | Admitting: Family Medicine

## 2024-07-28 ENCOUNTER — Other Ambulatory Visit: Payer: Self-pay | Admitting: Family Medicine

## 2024-07-30 ENCOUNTER — Other Ambulatory Visit: Payer: Self-pay | Admitting: Family Medicine

## 2024-07-30 ENCOUNTER — Other Ambulatory Visit (HOSPITAL_BASED_OUTPATIENT_CLINIC_OR_DEPARTMENT_OTHER): Payer: Self-pay

## 2024-07-31 ENCOUNTER — Other Ambulatory Visit (HOSPITAL_BASED_OUTPATIENT_CLINIC_OR_DEPARTMENT_OTHER): Payer: Self-pay

## 2024-07-31 ENCOUNTER — Ambulatory Visit (HOSPITAL_BASED_OUTPATIENT_CLINIC_OR_DEPARTMENT_OTHER)
Admission: RE | Admit: 2024-07-31 | Discharge: 2024-07-31 | Disposition: A | Source: Ambulatory Visit | Attending: Family Medicine

## 2024-07-31 ENCOUNTER — Ambulatory Visit: Admitting: Family Medicine

## 2024-07-31 ENCOUNTER — Encounter: Payer: Self-pay | Admitting: Family Medicine

## 2024-07-31 VITALS — BP 118/68 | HR 63 | Temp 98.0°F | Resp 16 | Ht 71.0 in | Wt 203.0 lb

## 2024-07-31 DIAGNOSIS — M25511 Pain in right shoulder: Secondary | ICD-10-CM

## 2024-07-31 DIAGNOSIS — M79601 Pain in right arm: Secondary | ICD-10-CM

## 2024-07-31 MED ORDER — PREDNISONE 20 MG PO TABS
40.0000 mg | ORAL_TABLET | Freq: Every day | ORAL | 0 refills | Status: AC
  Filled 2024-07-31: qty 10, 5d supply, fill #0

## 2024-07-31 MED ORDER — METHOCARBAMOL 500 MG PO TABS
500.0000 mg | ORAL_TABLET | Freq: Three times a day (TID) | ORAL | 0 refills | Status: AC | PRN
  Filled 2024-07-31: qty 21, 7d supply, fill #0

## 2024-07-31 NOTE — Progress Notes (Signed)
 Musculoskeletal Exam  Patient: Darius Mitchell DOB: [birth year]  DOS: 07/31/2024  SUBJECTIVE:  Chief Complaint:   Chief Complaint  Patient presents with   Shoulder Injury    Fall Right Shoulder Injury    Darius Mitchell is a 74 y.o.  male for evaluation and treatment of R shoulder pain.   Onset:  4 days ago. Slipped and fell on R shoulder.  Location: upper humerus Character:  aching  Progression of issue:  is unchanged Associated symptoms: bruising, decreased ROM, +swelling No redness Treatment: to date has been ice, topical menthol, ibuprofen and heat.   Neurovascular symptoms: no  Past Medical History:  Diagnosis Date   Afib (HCC)    Anxiety and depression 03/25/2014   BMI 30.0-30.9,adult 05/15/2015   Carpal tunnel syndrome of right wrist 10/23/2015   CHF (congestive heart failure) (HCC)    Diverticulosis of colon without hemorrhage 03/17/2016   Essential hypertension, benign 03/25/2014   GERD (gastroesophageal reflux disease)    Hyperlipidemia    Hypothyroidism    Low testosterone  05/15/2015   Paroxysmal atrial fibrillation (HCC) 12/03/2018   Prostate cancer screening encounter, options and risks discussed 04/30/2014   Screening for ischemic heart disease 04/30/2014   Swelling of both lower extremities 11/29/2017   Visit for preventive health examination 04/30/2014    Objective: VITAL SIGNS: BP 118/68 (BP Location: Left Arm, Patient Position: Sitting)   Pulse 63   Temp 98 F (36.7 C) (Oral)   Resp 16   Ht 5' 11 (1.803 m)   Wt 203 lb (92.1 kg)   SpO2 99%   BMI 28.31 kg/m  Constitutional: Well formed, well developed. No acute distress. Thorax & Lungs: No accessory muscle use Musculoskeletal: R shoulder.   Normal active range of motion: no.  Decreased abduction and forward flexion Normal passive range of motion: no.  Increased abduction and forward flexion. Tenderness to palpation: Yes over coracoid process Deformity: no Ecchymosis: yes- medial arm Tests  positive: Hawkins elicited pain, empty can on the right, Neer's Unable to do liftoff, speeds equivocal Tests negative: Crossover Neurologic: Normal sensory function.  Psychiatric: Normal mood. Age appropriate judgment and insight. Alert & oriented x 3.    Assessment:  Acute pain of right shoulder - Plan: predniSONE  (DELTASONE ) 20 MG tablet, methocarbamol  (ROBAXIN ) 500 MG tablet, DG Shoulder Right  Right arm pain - Plan: predniSONE  (DELTASONE ) 20 MG tablet, methocarbamol  (ROBAXIN ) 500 MG tablet  Plan: Check x-ray to rule out impingement or possible fracture.  Warned him about potential facility fee which he states he is okay with.  5-day prednisone  burst 40 mg daily.  Avoid NSAIDs while on this and in general as he takes Eliquis .  Nonsedating muscle relaxer and Robaxin .  He will take at night and avoid during the day if it makes him drowsy.  Stretches/exercises, heat, ice, Tylenol .  If x-ray is normal, would consider referral to a specialist to rule out a tear.  Difficult exam today due to acute pain.  Hopefully can avoid adhesive capsulitis. F/u as originally scheduled. The patient voiced understanding and agreement to the plan.   Mabel [place], DO 07/31/24  11:58 AM

## 2024-07-31 NOTE — Patient Instructions (Signed)
Heat (pad or rice pillow in microwave) over affected area, 10-15 minutes twice daily.   Ice/cold pack over area for 10-15 min twice daily.  OK to take Tylenol 1000 mg (2 extra strength tabs) or 975 mg (3 regular strength tabs) every 6 hours as needed.  Let us know if you need anything.  EXERCISES  RANGE OF MOTION (ROM) AND STRETCHING EXERCISES These exercises may help you when beginning to rehabilitate your injury. While completing these exercises, remember:  Restoring tissue flexibility helps normal motion to return to the joints. This allows healthier, less painful movement and activity. An effective stretch should be held for at least 30 seconds. A stretch should never be painful. You should only feel a gentle lengthening or release in the stretched tissue.  ROM - Pendulum Bend at the waist so that your right / left arm falls away from your body. Support yourself with your opposite hand on a solid surface, such as a table or a countertop. Your right / left arm should be perpendicular to the ground. If it is not perpendicular, you need to lean over farther. Relax the muscles in your right / left arm and shoulder as much as possible. Gently sway your hips and trunk so they move your right / left arm without any use of your right / left shoulder muscles. Progress your movements so that your right / left arm moves side to side, then forward and backward, and finally, both clockwise and counterclockwise. Complete 10-15 repetitions in each direction. Many people use this exercise to relieve discomfort in their shoulder as well as to gain range of motion. Repeat 2 times. Complete this exercise 3 times per week.  STRETCH - Flexion, Standing Stand with good posture. With an underhand grip on your right / left hand and an overhand grip on the opposite hand, grasp a broomstick or cane so that your hands are a little more than shoulder-width apart. Keeping your right / left elbow straight and  shoulder muscles relaxed, push the stick with your opposite hand to raise your right / left arm in front of your body and then overhead. Raise your arm until you feel a stretch in your right / left shoulder, but before you have increased shoulder pain. Try to avoid shrugging your right / left shoulder as your arm rises by keeping your shoulder blade tucked down and toward your mid-back spine. Hold 30 seconds. Slowly return to the starting position. Repeat 2 times. Complete this exercise 3 times per week.  STRETCH - Internal Rotation Place your right / left hand behind your back, palm-up. Throw a towel or belt over your opposite shoulder. Grasp the towel/belt with your right / left hand. While keeping an upright posture, gently pull up on the towel/belt until you feel a stretch in the front of your right / left shoulder. Avoid shrugging your right / left shoulder as your arm rises by keeping your shoulder blade tucked down and toward your mid-back spine. Hold 30. Release the stretch by lowering your opposite hand. Repeat 2 times. Complete this exercise 3 times per week.  STRETCH - External Rotation and Abduction Stagger your stance through a doorframe. It does not matter which foot is forward. As instructed by your physician, physical therapist or athletic trainer, place your hands: And forearms above your head and on the door frame. And forearms at head-height and on the door frame. At elbow-height and on the door frame. Keeping your head and chest upright and your   stomach muscles tight to prevent over-extending your low-back, slowly shift your weight onto your front foot until you feel a stretch across your chest and/or in the front of your shoulders. Hold 30 seconds. Shift your weight to your back foot to release the stretch. Repeat 2 times. Complete this stretch 3 times per week.   STRENGTHENING EXERCISES  These exercises may help you when beginning to rehabilitate your injury. They may  resolve your symptoms with or without further involvement from your physician, physical therapist or athletic trainer. While completing these exercises, remember:  Muscles can gain both the endurance and the strength needed for everyday activities through controlled exercises. Complete these exercises as instructed by your physician, physical therapist or athletic trainer. Progress the resistance and repetitions only as guided. You may experience muscle soreness or fatigue, but the pain or discomfort you are trying to eliminate should never worsen during these exercises. If this pain does worsen, stop and make certain you are following the directions exactly. If the pain is still present after adjustments, discontinue the exercise until you can discuss the trouble with your clinician. If advised by your physician, during your recovery, avoid activity or exercises which involve actions that place your right / left hand or elbow above your head or behind your back or head. These positions stress the tissues which are trying to heal.  STRENGTH - Scapular Depression and Adduction With good posture, sit on a firm chair. Supported your arms in front of you with pillows, arm rests or a table top. Have your elbows in line with the sides of your body. Gently draw your shoulder blades down and toward your mid-back spine. Gradually increase the tension without tensing the muscles along the top of your shoulders and the back of your neck. Hold for 3 seconds. Slowly release the tension and relax your muscles completely before completing the next repetition. After you have practiced this exercise, remove the arm support and complete it in standing as well as sitting. Repeat 2 times. Complete this exercise 3 times per week.   STRENGTH - External Rotators Secure a rubber exercise band/tubing to a fixed object so that it is at the same height as your right / left elbow when you are standing or sitting on a firm  surface. Stand or sit so that the secured exercise band/tubing is at your side that is not injured. Bend your elbow 90 degrees. Place a folded towel or small pillow under your right / left arm so that your elbow is a few inches away from your side. Keeping the tension on the exercise band/tubing, pull it away from your body, as if pivoting on your elbow. Be sure to keep your body steady so that the movement is only coming from your shoulder rotating. Hold 3 seconds. Release the tension in a controlled manner as you return to the starting position. Repeat 2 times. Complete this exercise 3 times per week.   STRENGTH - Supraspinatus Stand or sit with good posture. Grasp a 2-3 lb weight or an exercise band/tubing so that your hand is "thumbs-up," like when you shake hands. Slowly lift your right / left hand from your thigh into the air, traveling about 30 degrees from straight out at your side. Lift your hand to shoulder height or as far as you can without increasing any shoulder pain. Initially, many people do not lift their hands above shoulder height. Avoid shrugging your right / left shoulder as your arm rises by keeping your   shoulder blade tucked down and toward your mid-back spine. Hold for 3 seconds. Control the descent of your hand as you slowly return to your starting position. Repeat 2 times. Complete this exercise 3 times per week.   STRENGTH - Shoulder Extensors Secure a rubber exercise band/tubing so that it is at the height of your shoulders when you are either standing or sitting on a firm arm-less chair. With a thumbs-up grip, grasp an end of the band/tubing in each hand. Straighten your elbows and lift your hands straight in front of you at shoulder height. Step back away from the secured end of band/tubing until it becomes tense. Squeezing your shoulder blades together, pull your hands down to the sides of your thighs. Do not allow your hands to go behind you. Hold for 3 seconds.  Slowly ease the tension on the band/tubing as you reverse the directions and return to the starting position. Repeat 2 times. Complete this exercise 3 times per week.   STRENGTH - Scapular Retractors Secure a rubber exercise band/tubing so that it is at the height of your shoulders when you are either standing or sitting on a firm arm-less chair. With a palm-down grip, grasp an end of the band/tubing in each hand. Straighten your elbows and lift your hands straight in front of you at shoulder height. Step back away from the secured end of band/tubing until it becomes tense. Squeezing your shoulder blades together, draw your elbows back as you bend them. Keep your upper arm lifted away from your body throughout the exercise. Hold 3 seconds. Slowly ease the tension on the band/tubing as you reverse the directions and return to the starting position. Repeat 2 times. Complete this exercise 3 times per week.  STRENGTH - Scapular Depressors Find a sturdy chair without wheels, such as a from a dining room table. Keeping your feet on the floor, lift your bottom from the seat and lock your elbows. Keeping your elbows straight, allow gravity to pull your body weight down. Your shoulders will rise toward your ears. Raise your body against gravity by drawing your shoulder blades down your back, shortening the distance between your shoulders and ears. Although your feet should always maintain contact with the floor, your feet should progressively support less body weight as you get stronger. Hold 3 seconds. In a controlled and slow manner, lower your body weight to begin the next repetition. Repeat 2 times. Complete this exercise 3 times per week.    This information is not intended to replace advice given to you by your health care provider. Make sure you discuss any questions you have with your health care provider.   Document Released: 04/27/2005 Document Revised: 07/04/2014 Document Reviewed:  09/25/2008 [employer] Interactive Patient Education 2016 [employer]  

## 2024-08-02 ENCOUNTER — Other Ambulatory Visit: Payer: Self-pay | Admitting: Family Medicine

## 2024-08-02 ENCOUNTER — Ambulatory Visit: Payer: Self-pay | Admitting: Family Medicine

## 2025-06-03 ENCOUNTER — Ambulatory Visit
